# Patient Record
Sex: Male | Born: 1960 | Race: White | Hispanic: No | Marital: Single | Smoking: Current every day smoker
Health system: Southern US, Community
[De-identification: ages and names within clinical notes are randomized; demographics above are authoritative.]

## PROBLEM LIST (undated history)

## (undated) ENCOUNTER — Encounter

## (undated) DIAGNOSIS — G709 Myoneural disorder, unspecified: Secondary | ICD-10-CM

## (undated) DIAGNOSIS — M62838 Other muscle spasm: Secondary | ICD-10-CM

## (undated) DIAGNOSIS — R0600 Dyspnea, unspecified: Secondary | ICD-10-CM

## (undated) DIAGNOSIS — F4310 Post-traumatic stress disorder, unspecified: Secondary | ICD-10-CM

## (undated) DIAGNOSIS — R32 Unspecified urinary incontinence: Secondary | ICD-10-CM

## (undated) DIAGNOSIS — F329 Major depressive disorder, single episode, unspecified: Secondary | ICD-10-CM

## (undated) DIAGNOSIS — I10 Essential (primary) hypertension: Secondary | ICD-10-CM

## (undated) DIAGNOSIS — F32A Depression, unspecified: Secondary | ICD-10-CM

## (undated) DIAGNOSIS — E785 Hyperlipidemia, unspecified: Secondary | ICD-10-CM

## (undated) DIAGNOSIS — R159 Full incontinence of feces: Secondary | ICD-10-CM

## (undated) HISTORY — DX: Essential (primary) hypertension: I10

## (undated) HISTORY — DX: Hyperlipidemia, unspecified: E78.5

## (undated) HISTORY — PX: HAND SURGERY: SHX662

## (undated) HISTORY — DX: Major depressive disorder, single episode, unspecified: F32.9

## (undated) HISTORY — DX: Depression, unspecified: F32.A

## (undated) HISTORY — DX: Myoneural disorder, unspecified: G70.9

## (undated) HISTORY — PX: OTHER SURGICAL HISTORY: SHX169

---

## 2015-06-20 HISTORY — PX: SHOULDER SURGERY: SHX246

## 2016-01-07 DIAGNOSIS — S0093XA Contusion of unspecified part of head, initial encounter: Secondary | ICD-10-CM

## 2016-01-07 DIAGNOSIS — M25512 Pain in left shoulder: Principal | ICD-10-CM

## 2016-01-07 DIAGNOSIS — S0083XA Contusion of other part of head, initial encounter: Secondary | ICD-10-CM

## 2016-01-07 DIAGNOSIS — S43102A Unspecified dislocation of left acromioclavicular joint, initial encounter: Secondary | ICD-10-CM

## 2016-01-07 DIAGNOSIS — W19XXXA Unspecified fall, initial encounter: Secondary | ICD-10-CM

## 2016-01-07 DIAGNOSIS — F17210 Nicotine dependence, cigarettes, uncomplicated: Secondary | ICD-10-CM

## 2016-01-08 ENCOUNTER — Emergency Department: Admit: 2016-01-08 | Discharge: 2016-01-08

## 2016-01-08 ENCOUNTER — Inpatient Hospital Stay: Admit: 2016-01-08 | Discharge: 2016-01-08

## 2016-01-08 DIAGNOSIS — M25512 Pain in left shoulder: Principal | ICD-10-CM

## 2016-01-08 DIAGNOSIS — S0083XA Contusion of other part of head, initial encounter: Secondary | ICD-10-CM

## 2016-01-08 DIAGNOSIS — F17210 Nicotine dependence, cigarettes, uncomplicated: Secondary | ICD-10-CM

## 2016-01-08 DIAGNOSIS — S43102A Unspecified dislocation of left acromioclavicular joint, initial encounter: Secondary | ICD-10-CM

## 2016-01-08 MED ORDER — IBUPROFEN 400 MG PO TABS
800 mg | Freq: Once | ORAL | Status: CP
Start: 2016-01-08 — End: ?

## 2016-01-08 MED ORDER — TETANUS-DIPHTH-ACELL PERTUSSIS 5-2-15.5 LF-MCG/0.5 IM SUSP
.5 mL | Freq: Once | INTRAMUSCULAR | Status: CP
Start: 2016-01-08 — End: ?

## 2016-01-08 MED ORDER — OXYCODONE-ACETAMINOPHEN 5-325 MG PO TABS
1 | ORAL | 0 refills | Status: CP | PRN
Start: 2016-01-08 — End: ?

## 2016-01-08 MED ORDER — OXYCODONE-ACETAMINOPHEN 5-325 MG PO TABS
5 mg | Freq: Once | ORAL | Status: CP
Start: 2016-01-08 — End: ?
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
## 2016-01-08 NOTE — ED Notes
55 yo wm ambulates to room c co left shoulder pain after falling on concrete.  Said there was a "lump" on his shoulder that disappeared when he moved it.  Unable to move left arm comfortably now.  Abrasions to left temple, right wrist, right ankle, and arch of left foot.  Last tetanus greater than 5 years ago.

## 2016-01-08 NOTE — ED Provider Notes
Cardiovascular: Negative for chest pain and leg swelling.   Gastrointestinal: Negative for nausea, vomiting, abdominal pain and diarrhea.   Genitourinary: Negative for dysuria and difficulty urinating.   Musculoskeletal: Positive for arthralgias. Negative for back pain.   Skin: Positive for wound (abrasions). Negative for rash.   Neurological: Negative for dizziness and headaches.   All other systems reviewed and are negative.      Physical Exam     ED Triage Vitals   BP 01/07/16 2302 114/75   Pulse 01/07/16 2302 78   Resp 01/07/16 2302 18   Temp 01/07/16 2302 36.7 ?C (98 ?F)   Temp src 01/07/16 2302 Oral   Height 01/07/16 2302 1.88 m   Weight 01/07/16 2302 99.8 kg   SpO2 01/07/16 2302 96 %   BMI (Calculated) 01/07/16 2302 28.31       BP 114/75 - Pulse 78 - Temp 36.7 ?C (98 ?F) (Oral)  - Resp 18 - Ht 1.88 m - Wt 99.8 kg - SpO2 96% - BMI 28.25 kg/m2    Physical Exam   Constitutional: He is oriented to person, place, and time. He appears well-developed and well-nourished. No distress.   HENT:   Head: Normocephalic.   Nose: Nose normal.   Contusion to the left forehead   Eyes: Conjunctivae and EOM are normal. Pupils are equal, round, and reactive to light.   Neck: Neck supple. No tracheal deviation present.   No C spine tenderness   Cardiovascular: Normal rate, regular rhythm and normal heart sounds.    Pulmonary/Chest: Effort normal and breath sounds normal. No stridor. No respiratory distress. He has no wheezes. He has no rales. He exhibits no tenderness.   Abdominal: Soft. He exhibits no distension. There is no tenderness. There is no rebound.   Musculoskeletal: He exhibits tenderness. He exhibits no edema or deformity.   TTP over the left distal clavicle. FROM of the left shouolder. Good distal pulses. Distal sensation and motor intact.   No T or L spine tenderness.   Abrasions to the right forearm with no bony tenderness throughout the right upper extremity.

## 2016-01-08 NOTE — ED Provider Notes
Musculoskeletal: He exhibits tenderness. He exhibits no edema or deformity.   TTP over the left distal clavicle. FROM of the left shouolder. Good distal pulses. Distal sensation and motor intact.   No T or L spine tenderness.   Abrasions to the right forearm with no bony tenderness throughout the right upper extremity.      Neurological: He is alert and oriented to person, place, and time.   Skin: Skin is warm and dry. He is not diaphoretic.   Psychiatric: He has a normal mood and affect.   Nursing note and vitals reviewed.      Differential DDx: head contusion, head bleed, clavicle fracture, shoulder sprain, other    Is this an Emergent Medical Condition? Yes - Severe Pain/Acute Onset of Symptons  409.901 FS  641.19 FS  627.732 (16) FS    ED Workup   Procedures    Labs:  - - No data to display      Imaging (Read by ED Provider):  Per Radiology: Ct Head W/o Con    Result Date: 01/08/2016  CT HEAD WITHOUT CONTRAST HISTORY: Mechanical fall TECHNIQUE: Noncontrast CT of the brain was performed. COMPARISON: None FINDINGS:There is no acute intracranial hemorrhage.  There is no midline shift or mass effect.  There are no extra-axial collections.  The ventricles, sulci, and cisterns are unremarkable.  There is no CT evidence for acute territorial infarct. The paranasal sinuses and mastoid air cells are clear. Small left frontal scalp hematoma. Otherwise the soft tissues, bones and globes are unremarkable.     No evidence of acute intracranial abnormality. Very small left frontal scalp hematoma.     Per Radiology: Xr Foot Left 3 Views    Result Date: 01/08/2016  XR FOOT LEFT 3 VIEWS Clinical indication:54 years Male Fall Comparison:  None Findings: Negative for fracture or dislocation.  The joint spaces are normal.  Soft tissues are unremarkable.      Impression: Negative for fracture or acute osseous abnormality.     Per Radiology: Xr Shoulder Left Complete 2 Or 3 Views    Result Date: 01/08/2016
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
## 2016-01-08 NOTE — ED Provider Notes
History   No chief complaint on file.      HPI    Allergies not on file    Patient's Medications    No medications on file       No past medical history on file.    No past surgical history on file.    No family history on file.    Social History     Social History   ? Marital status: N/A     Spouse name: N/A   ? Number of children: N/A   ? Years of education: N/A     Social History Main Topics   ? Smoking status: Not on file   ? Smokeless tobacco: Not on file   ? Alcohol use Not on file   ? Drug use: Not on file   ? Sexual activity: Not on file     Other Topics Concern   ? Not on file     Social History Narrative       Review of Systems    Physical Exam     ED Triage Vitals   BP 01/07/16 2302 114/75   Pulse 01/07/16 2302 78   Resp 01/07/16 2302 18   Temp 01/07/16 2302 36.7 ?C (98 ?F)   Temp src 01/07/16 2302 Oral   Height 01/07/16 2302 1.88 m   Weight 01/07/16 2302 99.8 kg   SpO2 01/07/16 2302 96 %   BMI (Calculated) 01/07/16 2302 28.31       BP 114/75 - Pulse 78 - Temp 36.7 ?C (98 ?F) (Oral)  - Resp 18 - Ht 1.88 m - Wt 99.8 kg - SpO2 96% - BMI 28.25 kg/m2    Physical Exam    Differential DDx: ***    Is this an Emergent Medical Condition? {SH ED EMERGENT MEDICAL CONDITION:[phone]}  409.901 FS  641.19 FS  627.732 (16) FS    ED Workup   Procedures    Labs:  - - No data to display      Imaging (Read by ED Provider):  {Imaging findings:[phone]}    EKG (Read by ED Provider):  {EKG [phone]}    MDM    ED Course & Re-Evaluation     ED Course         ED Disposition   ED Disposition: No ED Disposition Set      ED Clinical Impression   ED Clinical Impression:   No Clinical Impression Set      ED Patient Status   Patient Status:   {SH ED [facility] PATIENT [phone]}        ED Medical Evaluation Initiated   Medical Evaluation Initiated:   Yes, filed at 01/07/16 2359  by Werner Lean, MD

## 2016-01-08 NOTE — ED Provider Notes
Impression: AC joint widening with slight elevation of the distal clavicle suspicious for grade 2 AC joint injury.     EKG (Read by ED Provider):  not applicable    MDM  Number of Diagnoses or Management Options  Acromioclavicular joint separation, left, initial encounter:   Contusion of head, unspecified part of head, initial encounter:   Fall, initial encounter:      Amount and/or Complexity of Data Reviewed  Tests in the radiology section of CPT?: ordered and reviewed  Discussion of test results with the performing providers: no  Decide to obtain previous medical records or to obtain history from someone other than the patient: no  Obtain history from someone other than the patient: no  Review and summarize past medical records: no  Discuss the patient with other providers: no  Independent visualization of images, tracings, or specimens: yes        ED Course & Re-Evaluation     Patient is a 55 y.o. male presenting to the ED today with c/o left shouder pain status post ground level fall while running. Positive LOC. Patient also with note of some abrasions. No HA, CP, abdominal pain, or N/V. Will provide patient with Motrin and Percocet; imaging studies ordered. Will re-assess.  Werner Lean, MD 12:16 AM 01/08/2016    Patient is resting comfortably and feels improved after Percocet. CT Head negative. XR L Shoulder shows widening of the AC joint. Results discussed with the patient at length. Sling applied in the ED. Will discharge home with prescription for Percocet. PCP and ortho follow up given.   Werner Lean, MD 2:20 AM 01/08/2016    ED Course     ED Disposition   ED Disposition: No ED Disposition Set      ED Clinical Impression   ED Clinical Impression:   No Clinical Impression Set      ED Patient Status   Patient Status:   Good        ED Medical Evaluation Initiated   Medical Evaluation Initiated:   Yes, filed at 01/07/16 2359  by Werner Lean, MD

## 2016-01-08 NOTE — ED Provider Notes
Neurological: He is alert and oriented to person, place, and time.   Skin: Skin is warm and dry. He is not diaphoretic.   Psychiatric: He has a normal mood and affect.   Nursing note and vitals reviewed.      Differential DDx: head contusion, head bleed, clavicle fracture, shoulder sprain, other    Is this an Emergent Medical Condition? Yes - Severe Pain/Acute Onset of Symptons  409.901 FS  641.19 FS  627.732 (16) FS    ED Workup   Procedures    Labs:  - - No data to display      Imaging (Read by ED Provider):  Per Radiology: Ct Head W/o Con    Result Date: 01/08/2016  CT HEAD WITHOUT CONTRAST HISTORY: Mechanical fall TECHNIQUE: Noncontrast CT of the brain was performed. COMPARISON: None FINDINGS:There is no acute intracranial hemorrhage.  There is no midline shift or mass effect.  There are no extra-axial collections.  The ventricles, sulci, and cisterns are unremarkable.  There is no CT evidence for acute territorial infarct. The paranasal sinuses and mastoid air cells are clear. Small left frontal scalp hematoma. Otherwise the soft tissues, bones and globes are unremarkable.     No evidence of acute intracranial abnormality. Very small left frontal scalp hematoma.     Per Radiology: Xr Foot Left 3 Views    Result Date: 01/08/2016  XR FOOT LEFT 3 VIEWS Clinical indication:54 years Male Fall Comparison:  None Findings: Negative for fracture or dislocation.  The joint spaces are normal.  Soft tissues are unremarkable.      Impression: Negative for fracture or acute osseous abnormality.     Per Radiology: Xr Shoulder Left Complete 2 Or 3 Views    Result Date: 01/08/2016  XR SHOULDER LEFT COMPLETE 3 VIEWS Clinical indication:54 years Male Fall Comparison:  None Findings: No evidence of fracture. There is widened appearance of the acromioclavicular joint with slight  elevation of the distal clavicle, suspicious for grade 2 AC joint injury.  The glenohumeral joint space is unremarkable.  Soft tissues are unremarkable.

## 2016-01-08 NOTE — ED Provider Notes
?   Sexual activity: Not Asked     Other Topics Concern   ? None     Social History Narrative   ? None       Review of Systems   Constitutional: Negative for fever and chills.   HENT: Negative for nasal congestion, sore throat and neck pain.    Eyes: Negative for discharge and redness.   Respiratory: Negative for cough, shortness of breath and wheezing.    Cardiovascular: Negative for chest pain and leg swelling.   Gastrointestinal: Negative for nausea, vomiting, abdominal pain and diarrhea.   Genitourinary: Negative for dysuria and difficulty urinating.   Musculoskeletal: Positive for arthralgias. Negative for back pain.   Skin: Positive for wound (abrasions). Negative for rash.   Neurological: Negative for dizziness and headaches.   All other systems reviewed and are negative.      Physical Exam       ED Triage Vitals   BP 01/07/16 2302 114/75   Pulse 01/07/16 2302 78   Resp 01/07/16 2302 18   Temp 01/07/16 2302 36.7 ?C (98 ?F)   Temp src 01/07/16 2302 Oral   Height 01/07/16 2302 1.88 m   Weight 01/07/16 2302 99.8 kg   SpO2 01/07/16 2302 96 %   BMI (Calculated) 01/07/16 2302 28.31       BP 127/83 - Pulse 81 - Temp 36.4 ?C (97.6 ?F) (Oral)  - Resp 18 - Ht 1.88 m - Wt 95.3 kg - SpO2 98% - BMI 26.98 kg/m2    Physical Exam   Constitutional: He is oriented to person, place, and time. He appears well-developed and well-nourished. No distress.   HENT:   Head: Normocephalic.   Nose: Nose normal.   Contusion to the left forehead   Eyes: Conjunctivae and EOM are normal. Pupils are equal, round, and reactive to light.   Neck: Neck supple. No tracheal deviation present.   No C spine tenderness   Cardiovascular: Normal rate, regular rhythm and normal heart sounds.    Pulmonary/Chest: Effort normal and breath sounds normal. No stridor. No respiratory distress. He has no wheezes. He has no rales. He exhibits no tenderness.   Abdominal: Soft. He exhibits no distension. There is no tenderness. There is no rebound.

## 2016-01-08 NOTE — ED Provider Notes
Scribe Attestation:  IPrudencio Burly, have acted as Neurosurgeon for Werner Lean, MD 12:16 AM 01/08/2016    Physician Attestation: Marland Kitchen

## 2016-01-08 NOTE — ED Provider Notes
Scribe Attestation:  I, Prudencio Burly, have acted as Neurosurgeon for Werner Lean, MD 12:16 AM 01/08/2016    Physician Attestation: I have reviewed and confirmed the information stated by the scribe and made corrections and edits as appropriate.  I have personally provided the services documented by the scribe.      Mendel Ryder, MD  Mendel Ryder, MD 4:07 AM 01/08/2016

## 2016-01-08 NOTE — ED Provider Notes
Neurological: He is alert and oriented to person, place, and time.   Skin: Skin is warm and dry. He is not diaphoretic.   Psychiatric: He has a normal mood and affect.   Nursing note and vitals reviewed.      Differential DDx: head contusion, head bleed, clavicle fracture, shoulder sprain, other    Is this an Emergent Medical Condition? Yes - Severe Pain/Acute Onset of Symptons  409.901 FS  641.19 FS  627.732 (16) FS    ED Workup   Procedures    Labs:  - - No data to display      Imaging (Read by ED Provider):  {Imaging findings:[phone]}    EKG (Read by ED Provider):  {EKG findings:[phone]}    MDM    ED Course & Re-Evaluation     Patient is a 55 y.o. male presenting to the ED today with c/o left shouder pain status post ground level fall while running. Positive LOC. Patient also with note of some abrasions. No HA, CP, abdominal pain, or N/V. Will provide patient with Motrin and Percocet; imaging studies ordered. Will re-assess.  Werner Lean, MD 12:16 AM 01/08/2016    ED Course     ED Disposition   ED Disposition: No ED Disposition Set      ED Clinical Impression   ED Clinical Impression:   No Clinical Impression Set      ED Patient Status   Patient Status:   {SH ED [facility] PATIENT [phone]}        ED Medical Evaluation Initiated   Medical Evaluation Initiated:   Yes, filed at 01/07/16 2359  by Werner Lean, MD              Scribe Attestation:  I, Prudencio Burly, have acted as scribe for Werner Lean, MD 12:16 AM 01/08/2016    Physician Attestation: Marland Kitchen

## 2016-01-08 NOTE — ED Provider Notes
XR SHOULDER LEFT COMPLETE 3 VIEWS Clinical indication:54 years Male Fall Comparison:  None Findings: No evidence of fracture. There is widened appearance of the acromioclavicular joint with slight  elevation of the distal clavicle, suspicious for grade 2 AC joint injury.  The glenohumeral joint space is unremarkable.  Soft tissues are unremarkable.      Impression: AC joint widening with slight elevation of the distal clavicle suspicious for grade 2 AC joint injury.     EKG (Read by ED Provider):  not applicable    MDM  Number of Diagnoses or Management Options  Acromioclavicular joint separation, left, initial encounter:   Contusion of head, unspecified part of head, initial encounter:   Fall, initial encounter:      Amount and/or Complexity of Data Reviewed  Tests in the radiology section of CPT?: ordered and reviewed  Discussion of test results with the performing providers: no  Decide to obtain previous medical records or to obtain history from someone other than the patient: no  Obtain history from someone other than the patient: no  Review and summarize past medical records: no  Discuss the patient with other providers: no  Independent visualization of images, tracings, or specimens: yes        ED Course & Re-Evaluation     Patient is a 55 y.o. male presenting to the ED today with c/o left shouder pain status post ground level fall while running. Positive LOC. Patient also with note of some abrasions. No HA, CP, abdominal pain, or N/V. Will provide patient with Motrin and Percocet; imaging studies ordered. Will re-assess.  Werner Lean, MD 12:16 AM 01/08/2016    Patient is resting comfortably and feels improved after Percocet. CT Head negative. XR L Shoulder shows widening of the AC joint. Results discussed with the patient at length. Sling applied in the ED. Will discharge home with prescription for Percocet. PCP and ortho follow up given. I will

## 2016-01-08 NOTE — ED Provider Notes
treat pt for fall, head contusion, and shoulder sprain.  Werner Lean, MD 2:20 AM 01/08/2016    ED Course     ED Disposition   ED Disposition: Discharge      ED Clinical Impression   ED Clinical Impression:   Fall, initial encounter  Acromioclavicular joint separation, left, initial encounter  Contusion of head, unspecified part of head, initial encounter      ED Patient Status   Patient Status:   Good        ED Medical Evaluation Initiated   Medical Evaluation Initiated:   Yes, filed at 01/07/16 2359  by Werner Lean, MD              Scribe Attestation:  I, Prudencio Burly, have acted as scribe for Werner Lean, MD 12:16 AM 01/08/2016    Physician Attestation: I have reviewed and confirmed the information stated by the scribe and made corrections and edits as appropriate.  I have personally provided the services documented by the scribe.      Mendel Ryder, MD  Mendel Ryder, MD 4:07 AM 01/08/2016         Werner Lean, MD  01/08/16 [phone]

## 2016-01-08 NOTE — ED Notes
Discharged to home c instructions.  Sling provided and placed to left arm.  Circulation checks good.  Ambulated out of ED c steady gait.

## 2016-01-21 DIAGNOSIS — M659 Synovitis and tenosynovitis, unspecified: Secondary | ICD-10-CM

## 2016-01-21 DIAGNOSIS — F17200 Nicotine dependence, unspecified, uncomplicated: Secondary | ICD-10-CM

## 2016-01-21 DIAGNOSIS — S46019A Strain of muscle(s) and tendon(s) of the rotator cuff of unspecified shoulder, initial encounter: Secondary | ICD-10-CM

## 2016-01-21 DIAGNOSIS — S43005A Unspecified dislocation of left shoulder joint, initial encounter: Principal | ICD-10-CM

## 2016-01-21 DIAGNOSIS — S43439A Superior glenoid labrum lesion of unspecified shoulder, initial encounter: Secondary | ICD-10-CM

## 2016-02-01 ENCOUNTER — Ambulatory Visit: Primary: Specialist

## 2016-02-01 DIAGNOSIS — F17200 Nicotine dependence, unspecified, uncomplicated: Secondary | ICD-10-CM

## 2016-02-01 DIAGNOSIS — M659 Synovitis and tenosynovitis, unspecified: Secondary | ICD-10-CM

## 2016-02-01 DIAGNOSIS — E785 Hyperlipidemia, unspecified: Secondary | ICD-10-CM

## 2016-02-01 DIAGNOSIS — S43439A Superior glenoid labrum lesion of unspecified shoulder, initial encounter: Secondary | ICD-10-CM

## 2016-02-01 DIAGNOSIS — S43005A Unspecified dislocation of left shoulder joint, initial encounter: Principal | ICD-10-CM

## 2016-02-01 DIAGNOSIS — S46019A Strain of muscle(s) and tendon(s) of the rotator cuff of unspecified shoulder, initial encounter: Secondary | ICD-10-CM

## 2016-02-01 DIAGNOSIS — I10 Essential (primary) hypertension: Secondary | ICD-10-CM

## 2016-02-01 MED ORDER — TADALAFIL 10 MG PO TABS
ORAL | PRN
Start: 2016-02-01 — End: ?

## 2016-02-01 MED ORDER — UNKNOWN TO PATIENT
Status: SS
Start: 2016-02-01 — End: 2016-02-11

## 2016-02-01 MED ORDER — HYDROCHLOROTHIAZIDE 25 MG PO TABS
25 mg | Freq: Every day | ORAL
Start: 2016-02-01 — End: ?

## 2016-02-01 MED ORDER — ATORVASTATIN CALCIUM 10 MG PO TABS
10 mg | Freq: Every day | ORAL
Start: 2016-02-01 — End: ?

## 2016-02-11 ENCOUNTER — Ambulatory Visit

## 2016-02-11 ENCOUNTER — Ambulatory Visit: Admit: 2016-02-11 | Discharge: 2016-02-11 | Primary: Specialist

## 2016-02-11 ENCOUNTER — Inpatient Hospital Stay

## 2016-02-11 DIAGNOSIS — S43005A Unspecified dislocation of left shoulder joint, initial encounter: Principal | ICD-10-CM

## 2016-02-11 DIAGNOSIS — S46019A Strain of muscle(s) and tendon(s) of the rotator cuff of unspecified shoulder, initial encounter: Secondary | ICD-10-CM

## 2016-02-11 DIAGNOSIS — S43439A Superior glenoid labrum lesion of unspecified shoulder, initial encounter: Secondary | ICD-10-CM

## 2016-02-11 DIAGNOSIS — I10 Essential (primary) hypertension: Principal | ICD-10-CM

## 2016-02-11 DIAGNOSIS — E785 Hyperlipidemia, unspecified: Secondary | ICD-10-CM

## 2016-02-11 DIAGNOSIS — M659 Synovitis and tenosynovitis, unspecified: Secondary | ICD-10-CM

## 2016-02-11 DIAGNOSIS — F17200 Nicotine dependence, unspecified, uncomplicated: Secondary | ICD-10-CM

## 2016-02-11 MED ORDER — PHENYLEPHRINE HCL-NACL (PF) 1-0.9 MG/10ML-% IV SOSY
Status: DC | PRN
Start: 2016-02-11 — End: 2016-02-11

## 2016-02-11 MED ORDER — DEXAMETHASONE SODIUM PHOSPHATE 4 MG/ML CUSTOM COMPONENT JX
Status: DC | PRN
Start: 2016-02-11 — End: 2016-02-11

## 2016-02-11 MED ORDER — DIPHENHYDRAMINE HCL 50 MG/ML IJ SOLN
12.5 mg | Freq: Once | INTRAVENOUS | Status: DC | PRN
Start: 2016-02-11 — End: 2016-02-12

## 2016-02-11 MED ORDER — PROPOFOL 10 MG/ML IV EMUL CUSTOM SH
Status: DC | PRN
Start: 2016-02-11 — End: 2016-02-11

## 2016-02-11 MED ORDER — PROMETHAZINE HCL 25 MG/ML IJ SOLN
25 mg | Freq: Four times a day (QID) | INTRAMUSCULAR | Status: DC | PRN
Start: 2016-02-11 — End: 2016-02-12

## 2016-02-11 MED ORDER — LIDOCAINE HCL 1 % IJ SOLN
Status: DC | PRN
Start: 2016-02-11 — End: 2016-02-11

## 2016-02-11 MED ORDER — CEFAZOLIN SODIUM 1 G IJ SOLR
2 g | Freq: Once | INTRAVENOUS | Status: CP
Start: 2016-02-11 — End: ?

## 2016-02-11 MED ORDER — HYDROMORPHONE HCL 1 MG/ML IJ SOLN
.5 mg | INTRAVENOUS | Status: DC | PRN
Start: 2016-02-11 — End: 2016-02-12

## 2016-02-11 MED ORDER — OXYCODONE-ACETAMINOPHEN 5-325 MG PO TABS
5 mg | ORAL | Status: DC | PRN
Start: 2016-02-11 — End: 2016-02-12

## 2016-02-11 MED ORDER — LACTATED RINGERS IV SOLN
Freq: Once | INTRAVENOUS | Status: CP
Start: 2016-02-11 — End: ?

## 2016-02-11 MED ORDER — MIDAZOLAM HCL 2 MG/2ML IJ SOLN
Status: DC | PRN
Start: 2016-02-11 — End: 2016-02-11

## 2016-02-11 MED ORDER — ROPIVACAINE HCL 5 MG/ML IJ SOLN
Status: DC | PRN
Start: 2016-02-11 — End: 2016-02-11

## 2016-02-11 MED ORDER — EPINEPHRINE HCL 1 MG/ML IJ SOLN CUSTOM SH
Status: DC | PRN
Start: 2016-02-11 — End: 2016-02-11

## 2016-02-11 MED ORDER — LACTATED RINGERS IV SOLN
Status: DC | PRN
Start: 2016-02-11 — End: 2016-02-11

## 2016-02-11 MED ORDER — PROMETHAZINE HCL 25 MG/ML IJ SOLN
6.25 mg | INTRAVENOUS | Status: DC | PRN
Start: 2016-02-11 — End: 2016-02-12

## 2016-02-11 MED ORDER — CELECOXIB 200 MG PO CAPS
400 mg | Freq: Once | ORAL | Status: CP
Start: 2016-02-11 — End: ?

## 2016-02-11 MED ORDER — GABAPENTIN 300 MG PO CAPS
600 mg | Freq: Once | ORAL | Status: CP
Start: 2016-02-11 — End: ?

## 2016-02-11 MED ORDER — NALOXONE HCL 2 MG/2ML IJ SOSY
.4 mg | INTRAVENOUS | Status: DC | PRN
Start: 2016-02-11 — End: 2016-02-12

## 2016-02-11 MED ORDER — FENTANYL CITRATE INJ 50 MCG/ML CUSTOM AMP/VIAL
Status: DC | PRN
Start: 2016-02-11 — End: 2016-02-11

## 2016-02-11 MED ORDER — DEXMEDETOMIDINE HCL 200 MCG/2ML IV SOLN
Status: DC | PRN
Start: 2016-02-11 — End: 2016-02-11

## 2016-02-11 MED ORDER — EPHEDRINE SULFATE-NACL 25-0.9 MG/5ML-% IV SOSY
Status: DC | PRN
Start: 2016-02-11 — End: 2016-02-11

## 2016-02-11 MED ORDER — OXYCODONE HCL 5 MG PO TABS
10 mg | Freq: Once | ORAL | Status: DC | PRN
Start: 2016-02-11 — End: 2016-02-12

## 2016-02-11 MED ORDER — ONDANSETRON HCL 4 MG/2ML IJ SOLN
Status: DC | PRN
Start: 2016-02-11 — End: 2016-02-11

## 2016-02-11 MED ORDER — ACETAMINOPHEN 500 MG PO TABS
1000 mg | Freq: Once | ORAL | Status: CP
Start: 2016-02-11 — End: ?

## 2016-02-11 MED ORDER — ONDANSETRON HCL 4 MG/2ML IJ SOLN
4 mg | Freq: Once | INTRAVENOUS | Status: DC | PRN
Start: 2016-02-11 — End: 2016-02-12

## 2016-02-11 NOTE — Progress Notes
Patient and friend verbalize understanding of discharge instructions. Discharged home via w/c to care of friend.

## 2016-02-11 NOTE — Immediate Post-Operative Note
[facility]  Immediate Post-Operative Note      Case Record #: [id number]  James Flynn  10/03/1960, 55 y.o., male    Case Date: 02/11/2016    Staff:  Surgeon(s) and Role:     * Swanson, Christopher E, MD - Primary  Anesthesiologist: Warrick, Matthew D, MD  CRNA: Ratermann, Andrea, CRNA      Pre-Op Diagnosis:   Dislocation of left acromioclavicular joint [S43.102A]    Procedure(s):  29827 - ARTHROSCOPIC ROTATOR CUFF REPAIR (Left)  29828 - ARTHROSCOPY SHOULDER W/TENODESIS BICEPS (Left)  29826 - ARTHROSCOPY SHOULDER W/SUBACROMIAL DECOMPRESSION W/PART ACROMIOPLASTY (Left)  29823 - ARTHROSCOPY SHOULDER W/EXTENSIVE DEB (Left)  29807 - ARTHROSCOPY SHOULDER REPAIR SLAP LESION (Left)    Post-Op Diagnosis Codes:     * Dislocation of left acromioclavicular joint [S43.102A]      Teaching MD Documentation  1. A Resident/ACGME fellow participated in this case: No   2. A qualified Resident/ACGME fellow was available to participate in the case:no        Findings/Description: AC Joint dislocation surgically reduced with reconstruciton.     Specimens: None    SCD's applied: yes    EBL: 4 mls

## 2016-02-11 NOTE — Anesthesia Preprocedure Evaluation
Department of Anesthesiology  Pre-operative Evaluation Record    Patient Name: James Flynn                  Sex: male                  Age: 55 y.o.                  MRN: [id number]     Allergies:   Review of patient's allergies indicates no known allergies.    Current Med List   Medication Sig   ? UNKNOWN TO PATIENT Blood pressure med       Prior Surgeries:     Past Surgical History:   Procedure Laterality Date   ? HAND SURGERY Right        Social   ETOH:       Alcohol Use   ? 36.0 oz/week   ? 3 Cans of beer per week     Comment: daily     Tobacco:   History   Smoking Status   ? Current Every Day Smoker   ? Packs/day: 1.00   ? Types: Cigarettes   Smokeless Tobacco   ? Never Used     Drugs:   History   Drug Use No       Evaluation:   ROS / Med Hx:  Negative for review of systems and medical history except otherwise noted below:    Cardiovascular: Negative cardio ROS except otherwise noted. Cardiac history includes: hyperlipidemia and hypertension. The hypertension is well controlled. The hypertension has occurred for 6 months - 1 year.   GI/Hepatic: Negative for GI/hepatic ROS except otherwise noted. acid reflux. food triggered GERD.   Endo/Other: Negative for endo/other ROS except otherwise noted

## 2016-02-11 NOTE — Anesthesia Procedure Notes
Peripheral Block  Location: Main PreOp  Start Time: 02/11/2016 11:13 AM  Block Reason: procedure for pain, at surgeon's request and post-op pain management  Patient Sedation: light sedation    Staff Involved in Procedure  Attending: Barnie AldermanWARRICK, MATTHEW D  Performed By: attending  Preanesthesia Checklist: patient identified, IV functional, site marked, risks and benefits discussed, surgical consent signed, monitors and equipment checked, pre-op evaluation completed, timeout performed, anesthesia consent signed, allergies checked, contraindications to procedure verified and consented to blood products    The patient was positioned right lateral decubitus. Cardiac monitors, continuous pulse ox and NIBP were applied. The skin was draped and prepped using chloraprep.A  left single-shot interscalene block was performed using a 2 inch 22 G Stimuplex A needle while employing a(n) anatomical landmarks and ultrasound guidance technique. and a twitch was not titrated. Slowly, 18mL of 0.5% ropivacaine was injected through the needle while carefully monitoring the patient. Upon therapeutic injection there was incremental injection and local visualized surrounding nerve on ultrasound. The patient did not have heme aspirated, hemodynamic instability, painful injection, resistance to injection, neurological changes or paresthesia on injection. After completing the procedure, the patient was monitored and will be evaluated post-op. Procedure tolerated well by the patient. Vital Signs Stable. Patient Comfortable.

## 2016-02-11 NOTE — Anesthesia Preprocedure Evaluation
Department of Anesthesiology  Pre-operative Evaluation Record    Patient Name: James SchwabWilliam T Cosner                  Sex: male                  Age: 55 y.o.                  MRN: [id number]     Allergies:   Review of patient's allergies indicates no known allergies.    Current Med List   Medication Sig   ? UNKNOWN TO PATIENT Blood pressure med       Prior Surgeries:     Past Surgical History:   Procedure Laterality Date   ? HAND SURGERY Right        Social   ETOH:       Alcohol Use   ? 36.0 oz/week   ? 3 Cans of beer per week     Comment: daily     Tobacco:   History   Smoking Status   ? Current Every Day Smoker   ? Packs/day: 1.00   ? Types: Cigarettes   Smokeless Tobacco   ? Never Used     Drugs:   History   Drug Use No       Evaluation:   ROS / Med Hx:  Negative for review of systems and medical history except otherwise noted below:    Cardiovascular: Negative cardio ROS except otherwise noted. Cardiac history includes: hyperlipidemia and hypertension. The hypertension is well controlled. The hypertension has occurred for 6 months - 1 year.   GI/Hepatic: Negative for GI/hepatic ROS except otherwise noted. acid reflux. food triggered GERD.   Endo/Other: Negative for endo/other ROS except otherwise noted     Physical Exam:  Airway: Mallampati score of I.   Pulmonary:  On pulmonary examination breath sounds clear to auscultation.   Cardiovascular: On cardiovascular examination regular rate and rhythm.     Anesthesia Plan:  Patient has an ASA score of 2 and patient is NPO. Plan for anesthesia technique is general.  With an intravenous type of induction. Anesthetic plan and risks has been discussed with the patient.     I have performed a pre-anesthesia examination, evaluation, and prescribed the anesthesia plan.

## 2016-02-11 NOTE — Progress Notes
Patient slowly awakening.Dr Warriclk to see patient. Patient tolerates small sips gingerale

## 2016-02-11 NOTE — Anesthesia Postprocedure Evaluation
Post Anesthesia Eval    Respiratory function appropriate( normal respiratory rate and oxygen saturation, airway patent )  Cardiovascular function appropriate( normal heart rate and blood pressure )  Mental status appropriate  Patient normothermic  Pain well controlled  No uncontrolled nausea and vomiting  Patient appropriately hydrated  No apparent anesthesia complications  Patient tolerated procedure well  Block instructions given

## 2016-02-11 NOTE — Immediate Post-Operative Note
[facility]  [facility]      Case Record #: [id number]  James SchwabWilliam T Flynn  11/25/1960, 55 y.o., male    Case Date: 02/11/2016    Staff:  Surgeon(s) and Role:     Darrel Hoover* Swanson, Christopher E, MD - Primary  Anesthesiologist: Sharyl NimrodWarrick, Matthew D, MD  CRNA: Tad Mooreatermann, Andrea, CRNA      Pre-Op Diagnosis:   Dislocation of left acromioclavicular joint [S43.102A]    Procedure(s):  [id number] - ARTHROSCOPIC ROTATOR CUFF REPAIR (Left)  [id number] - ARTHROSCOPY SHOULDER W/TENODESIS BICEPS (Left)  [id number] - ARTHROSCOPY SHOULDER W/SUBACROMIAL DECOMPRESSION W/PART ACROMIOPLASTY (Left)  [id number] - ARTHROSCOPY SHOULDER W/EXTENSIVE DEB (Left)  [id number] - ARTHROSCOPY SHOULDER REPAIR SLAP LESION (Left)    Post-Op Diagnosis Codes:     * Dislocation of left acromioclavicular joint [S43.102A]      Teaching MD Documentation  1. A Resident/ACGME fellow participated in this case: No   2. A qualified Resident/ACGME fellow was available to participate in the case:no        [facility]cit[facility].     Specimens: None    SCD's applied: yes    EBL: 4 mls

## 2016-02-11 NOTE — Progress Notes
Patient slowly awakening.Dr Rogelia RohrerWarriclk to see patient. Patient tolerates small sips gingerale

## 2016-02-11 NOTE — Progress Notes
Patient sleeping on and off, sipping gingeralw\e. Denies pain or nausea.

## 2016-02-11 NOTE — Progress Notes
Patient arrives post op   asleep, VSS, report from Ron RN and Sara Leendrea CRNA

## 2016-02-11 NOTE — Progress Notes
Patient sleeping on and off, sipping gingeralw\e. Denies pain or nausea.

## 2016-02-11 NOTE — Progress Notes
Patient arrives post op   asleep, VSS, report from Ron RN and Andrea CRNA

## 2016-02-14 ENCOUNTER — Inpatient Hospital Stay: Primary: Specialist

## 2016-02-18 ENCOUNTER — Inpatient Hospital Stay: Admit: 2016-02-18 | Discharge: 2016-03-18 | Primary: Specialist

## 2016-02-18 DIAGNOSIS — M25512 Pain in left shoulder: Principal | ICD-10-CM

## 2016-02-18 NOTE — Initial Plan of Care
Upper Extremity ROM: L shoulder AROM to be assessed at a later date due to stage of healing/MD protocol restrictions    SHOULDER AROM (degrees) PROM (degrees)    Left Right Left Endfeel Right Endfeel   Flexion NT 170 90 Empty NT NA   Abduction/Scaption NT 164 60 Empty NT NA   Internal Rotation NT To T10 To chest wall NA NT NA   External Rotation NT To T5 0 Empty NT NA       Flexibility: Not assessed due to stage of healing/MD protocol restrictions      Upper Extremity Strength: L shoulder MMT to be assessed at a later date due to stage of healing/MD protocol restrictions    SHOULDER Left Right   Flexion NT 5/5   Abduction NT 5/5   Internal Rotation NT 5/5   External Rotation NT 5/5       Upper Extremity Palpation:  ? Shoulder: Palpation reveals tenderness over the generalized L shoulder tenderness with palpation.      Joint Mobility   Left   Right   To be assessed at a later date                      Sensory/Reflex/Peripheral Nerve Integrity: No reported deficits in upper extremity sensibility.    Deep tendon reflexes:  ? None tested      Special Tests:  Shoulder:   ? None    Elbow:  ? None    Wrist/Hand:  ? None      Functional Mobility/Transfers: I with functional mobility/transfers      Outcome Measures:  ? FOTO Functional Status Measure: 45/100 (Full report available in Media Tab)      Treatment:  Treatment today consisted of the therapist and patient/caregiver discussing the treatment plans, goals, benefits, risks/drawbacks, potential problems related to recuperation, likelihood of success, potential results of non-treatment, significant alternative care options and the recommended frequency and duration of treatment.  The patient/caregiver is in agreement with the above and consented to the continuation of therapy.    Treatment today also consisted of: exercises including pendulums, scapular retractions, and gentle shoulder shrugs. PROM to L shoulder FF 90 degrees, scaption to 60 degrees, and ER to 0 degrees.
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
## 2016-02-18 NOTE — Initial Plan of Care
are medically necessary in order to improve the above mentioned deficits so that he may return to full functional use of the L UE for return to full work duties without the need for modificiation as well as full I with BADL/IADL. Patient was educated regarding anatomy, physiology, and HEP.    Prognosis: Good    PLAN:  PT Frequency: 2x/wk     Treatment Duration: 12 weeks     Equipment Recommended: cont use of Ultrasling     Other Services Recommended: NA      GOALS:  Short Term Goals: 6 Weeks  1. Patient will demonstrate I with HEP including AAROM exercises in order to be able to bathe himself with 1/10 or less pain with mod I.    Long Term Goals: 12 Weeks  1. Patient will demonstrate L shoulder PROM to within 5 degrees of R side for FF and ABD and ER to 90 degrees in order to be able to don a shirt independently.  2. Patient will demonstrate L shoulder AROM to within 5 degrees of the R side for FF and ABD, and ER to at least T3 and IR to at least T12 in order to be able to bathe himself independently and without modification.  3. Patient will demonstrate L shoulder strength to at least 4+/5 in order to be able to lift a half gallon of milk into the refrigerator.  4. 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      _________________________  Benay PillowMelinda W Petty, PT  02/18/2016       This plan of care is certified to be medically necessary for the time period from 02/18/2016 to 05/12/2016.

## 2016-02-18 NOTE — Initial Plan of Care
success, potential results of non-treatment, significant alternative care options and the recommended frequency and duration of treatment.  The patient/caregiver is in agreement with the above and consented to the continuation of therapy.    Treatment today also consisted of: exercises including pendulums, scapular retractions, and gentle shoulder shrugs. PROM to L shoulder FF 90 degrees, scaption to 60 degrees, and ER to 0 degrees.    Home Exercise Program: The patient was educated in and given a written copy of a home exercise program including the above exercises.    Frequency of HEP 3 times per day    The patient/caregiver understood and demonstrated comprehension of all education given during today?s treatment.    Total Treatment Time: 45 minutes  Total Timed Code Minutes: 20 minutes    Procedures Charged:    All Charges for This Encounter     Code Description Service Date Service Provider Modifiers Qty    9604540957500129 HB PT EVAL LOW COMPLX 02/18/2016 Benay Pillowetty, Melinda W, PT GP 1    [id number] HB PT MAN THERAPY 1 OR MORE EA 15MIN 02/18/2016 Benay Pillowetty, Melinda W, PT GP 1    [id number] HB PT CARRY CURRENT STATUS 02/18/2016 Benay Pillowetty, Melinda W, PT GP, CK 1    [id number] HB PT CARRY GOAL STATUS 02/18/2016 Benay PillowPetty, Melinda W, PT GP, CI 1            Physical Therapy Plan of Care:     Assessment: Decreased knowledge of therapeutic intervention, Decreased L shoulder range of motion, Decreased L shoulder and periscapular strength, Presence of edema, Increased pain, Decreased scapular stability, Impaired posture, Decreased ADL status and Decreased proprioception     Patient presents with decreased L shoulder ROM, presumably decreased L shoulder strength due to recent surgery and immobilization/non-use since injury, abnormal scapular mechanics due to periscapular weakness/atrophy, presence of edema throughout the L shoulder/proximal UE, and decreased functional activity tolerance and decreased I with BADL/IADL. PT services
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
## 2016-02-18 NOTE — Initial Plan of Care
scapular winging rest due to periscapular atrophy. Arrives with post-op dressing doffed and steri-strips applied over incisions. No redness or drainage present (no signs/symptoms of infection).    Posture: forward, rounded shoulder posture (more prominent L side    Effusion/Edema: moderate L shoulder joint effusion/proximal UE edema           Objective:     Cervical Spine Comments: cervical ROM WNL      Upper Extremity ROM: L shoulder AROM to be assessed at a later date due to stage of healing/MD protocol restrictions    SHOULDER AROM (degrees) PROM (degrees)    Left Right Left Endfeel Right Endfeel   Flexion NT 170 90 Empty NT NA   Abduction/Scaption NT 164 60 Empty NT NA   Internal Rotation NT To T10 To chest wall NA NT NA   External Rotation NT To T5 0 Empty NT NA       Flexibility: Not assessed due to stage of healing/MD protocol restrictions      Upper Extremity Strength: L shoulder MMT to be assessed at a later date due to stage of healing/MD protocol restrictions    SHOULDER Left Right   Flexion NT 5/5   Abduction NT 5/5   Internal Rotation NT 5/5   External Rotation NT 5/5       Upper Extremity Palpation:  ? Shoulder: Palpation reveals tenderness over the generalized L shoulder tenderness with palpation.      Joint Mobility   Left   Right   To be assessed at a later date                      Sensory/Reflex/Peripheral Nerve Integrity: No reported deficits in upper extremity sensibility.    Deep tendon reflexes:  ? None tested      Special Tests:  Shoulder:   ? None    Elbow:  ? None    Wrist/Hand:  ? None      Functional Mobility/Transfers: I with functional mobility/transfers      Outcome Measures:  ? FOTO Functional Status Measure: 45/100 (Full report available in Media Tab)      Treatment:  Treatment today consisted of the therapist and patient/caregiver discussing the treatment plans, goals, benefits, risks/drawbacks, potential problems related to recuperation, likelihood of

## 2016-02-18 NOTE — Initial Plan of Care
?   Pre Treatment Pain Rating: 0/10 at rest  ? Post Treatment Pain Rating: 1/10  ? Pain Location/Orientation: generalized L shoulder/proximal UE    ? Pain Descriptors: aching, sharp with movement    Past Medical History:   Diagnosis Date   ? Hyperlipidemia    ? Hypertension      Past Surgical History:   Procedure Laterality Date   ? HAND SURGERY Right      Current Outpatient Prescriptions on File Prior to Encounter   Medication Sig Dispense Refill   ? atorvastatin (LIPITOR) 10 MG Tablet Take 10 mg by mouth daily.     ? hydroCHLOROthiazide (HYDRODIURIL) 25 MG Tablet Take 25 mg by mouth daily.     ? oxyCODONE-acetaminophen (PERCOCET) 5-325 MG Tablet Take 1 tablet by mouth every 4 hours as needed. 15 tablet 0   ? tadalafil (CIALIS) 10 MG Tablet Take by mouth as needed for erectile dysfunction.       No current facility-administered medications on file prior to encounter.        Living Environment Function   Lives in: house:  one story    Stairs to Enter: 5    Home DME: does not use equipment    Bathroom set up: tubshower   Prior Level Of Function: was independent and pain-free with self-care, transfers, ambulation, household tasks, and/or recreational activities    Lives: alone.    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    Current Level of Function: Patient is unable to use his L UE for any BADL/IADL. He must use his R UE to wash his hair and bathe. He is only able to type with his R hand at this time to perform necessary work duties. He requires increased time to perform upper body and lower body dressing and does so with modification due to inability to raise or use L UE. He has difficulty sleeping at night and awakens on average 3 times/night.     Precautions Observations   NWB L UE; [facility]   General comments: Patient arrives to PT with Ultrasling donned L UE. L

## 2016-02-18 NOTE — Initial Plan of Care
GOALS:  Short Term Goals: 6 Weeks  1. Patient will demonstrate I with HEP including AAROM exercises in order to be able to bathe himself with 1/10 or less pain with mod I.    Long Term Goals: 12 Weeks  1. Patient will demonstrate L shoulder PROM to within 5 degrees of R side for FF and ABD and ER to 90 degrees in order to be able to don a shirt independently.  2. Patient will demonstrate L shoulder AROM to within 5 degrees of the R side for FF and ABD, and ER to at least T3 and IR to at least T12 in order to be able to bathe himself independently and without modification.  3. Patient will demonstrate L shoulder strength to at least 4+/5 in order to be able to lift a half gallon of milk into the refrigerator.  4. 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      _________________________  Benay PillowMelinda W Petty, PT  02/18/2016       This plan of care is certified to be medically necessary for the time period from 02/18/2016 to 05/12/2016.

## 2016-02-18 NOTE — Initial Plan of Care
GOALS:  Short Term Goals: 6 Weeks  1. Patient will demonstrate I with HEP including AAROM exercises in order to be able to bathe himself with 1/10 or less pain with mod I.    Long Term Goals: 12 Weeks  1. Patient will demonstrate L shoulder PROM to within 5 degrees of R side for FF and ABD and ER to 90 degrees in order to be able to don a shirt independently.  2. Patient will demonstrate L shoulder AROM to within 5 degrees of the R side for FF and ABD, and ER to at least T3 and IR to at least T12 in order to be able to bathe himself independently and without modification.  3. Patient will demonstrate L shoulder strength to at least 4+/5 in order to be able to lift a half gallon of milk into the refrigerator.  4. 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      _________________________  James Flynn, PT  02/18/2016       This plan of care is certified to be medically necessary for the time period from 02/18/2016 to 111/24/2017.

## 2016-02-18 NOTE — Initial Plan of Care
Home Exercise Program: The patient was educated in and given a written copy of a home exercise program including the above exercises.    Frequency of HEP 3 times per day    The patient/caregiver understood and demonstrated comprehension of all education given during today?s treatment.    Total Treatment Time: 45 minutes  Total Timed Code Minutes: 20 minutes    Procedures Charged:    All Charges for This Encounter     Code Description Service Date Service Provider Modifiers Qty    [id number] HB PT EVAL LOW COMPLX 02/18/2016 Benay Pillowetty, Melinda W, PT GP 1    [id number] HB PT MAN THERAPY 1 OR MORE EA 15MIN 02/18/2016 Benay Pillowetty, Melinda W, PT GP 1    [id number] HB PT CARRY CURRENT STATUS 02/18/2016 Benay Pillowetty, Melinda W, PT GP, CK 1    [id number] HB PT CARRY GOAL STATUS 02/18/2016 Benay PillowPetty, Melinda W, PT GP, CI 1            Physical Therapy Plan of Care:     Assessment: Decreased knowledge of therapeutic intervention, Decreased L shoulder range of motion, Decreased L shoulder and periscapular strength, Presence of edema, Increased pain, Decreased scapular stability, Impaired posture, Decreased ADL status and Decreased proprioception     Patient presents with decreased L shoulder ROM, presumably decreased L shoulder strength due to recent surgery and immobilization/non-use since injury, abnormal scapular mechanics due to periscapular weakness/atrophy, presence of edema throughout the L shoulder/proximal UE, and decreased functional activity tolerance and decreased I with BADL/IADL. PT services are medically necessary in order to improve the above mentioned deficits so that he may return to full functional use of the L UE for return to full work duties without the need for modificiation as well as full I with BADL/IADL. Patient was educated regarding anatomy, physiology, and HEP.    Prognosis: Good    PLAN:  PT Frequency: 2x/wk     Treatment Duration: 12 weeks     Equipment Recommended: cont use of Ultrasling     Other Services Recommended: NA

## 2016-02-18 NOTE — Initial Plan of Care
Medication Sig Dispense Refill   ? atorvastatin (LIPITOR) 10 MG Tablet Take 10 mg by mouth daily.     ? hydroCHLOROthiazide (HYDRODIURIL) 25 MG Tablet Take 25 mg by mouth daily.     ? oxyCODONE-acetaminophen (PERCOCET) 5-325 MG Tablet Take 1 tablet by mouth every 4 hours as needed. 15 tablet 0   ? tadalafil (CIALIS) 10 MG Tablet Take by mouth as needed for erectile dysfunction.       No current facility-administered medications on file prior to encounter.        Living Environment Function   Lives in: house:  one story    Stairs to Enter: 5    Home DME: does not use equipment    Bathroom set up: tubshower   Prior Level Of Function: was independent and pain-free with self-care, transfers, ambulation, household tasks, and/or recreational activities    Lives: alone.    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    Current Level of Function: Patient is unable to use his L UE for any BADL/IADL. He must use his R UE to wash his hair and bathe. He is only able to type with his R hand at this time to perform necessary work duties. He requires increased time to perform upper body and lower body dressing and does so with modification due to inability to raise or use L UE. He has difficulty sleeping at night and awakens on average 3 times/night.     Precautions Observations   NWB L UE; follow [facility]   General comments: Patient arrives to PT with Ultrasling donned L UE. 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 Arrives with post-op dressing doffed and steri-strips applied over incisions. No redness or drainage present (no signs/symptoms of infection).    Posture: forward, rounded shoulder posture (more prominent L side    Effusion/Edema: moderate L shoulder joint effusion/proximal UE edema           Objective:     Cervical Spine Comments: cervical ROM WNL

## 2016-02-19 NOTE — Op Note
approximated to the clavicle and TightRope was used to tension the button.  Our reduction was confirmed under fluoroscopy.  Suture tape was then tied over the button, followed by FiberTape.  All suture tails were then cut.  The wound was copiously irrigated with normal saline solution.  Position of the Dog Bone on the coracoid was again confirmed under direct visualization.  All fluid and debris was removed from the glenohumeral joint using arthroscopic shaver.  Portals were closed using buried 3-0 Monocryl sutures.  Multilayer closure was performed using 0 Vicryl for the fascia, 2-0 Vicryl for subcutaneous layer, running 3-0 Monocryl for skin for the incision over the distal clavicle.  A sterile dressing was placed with Steri-Strips, sterile gauze and tape.  The patient was placed into an UltraSling, awakened from anesthesia and taken to Recovery Room in stable condition.    Kendra Hentkowski PA-C was essential for all aspects of this procedure including positioning, visualization debridement, reduction of the [facility], passage of suture, placement of the button and closure.    PLAN:  Mr. James Flynn will be nonweightbearing of his left upper extremity.  He will leave his dressing in place until Monday, at which point he may remove it to shower.  He will wear a sling at all times.  He was given Percocet for pain control.  He will follow up as an outpatient to begin range of motion and physical therapy.      Darrel Hooverhristopher E Swanson, MD    CS/NTS  DD:  02/11/2016 14:06:55  DT:  02/12/2016 03:31:37  Job#:  [id number]  Conf#:  [id number]

## 2016-02-19 NOTE — Op Note
prepped and draped in normal sterile fashion.  Fluoroscopy was introduced to identify the distal clavicle AC separation.  Standard posterior portal was made.  Arthroscope was introduced into the glenohumeral joint.  Under needle localization and direct visualization, an anterior portal was made in the rotator interval.  There was significant hypertrophic synovitis and rotator interval debrided using arthroscopic shaver.  An accessory anterior superolateral portal was made.  There was evidence of a type 2 SLAP tear, which was debrided using arthroscopic shaver.  The biceps tendon was intact.  There was fraying of the superior border of the subscapularis, debrided using arthroscopic shaver.  There was also partial undersurface fraying of the supraspinatus, which was debrided using arthroscopic shaver.  The remainder of the rotator cuff was intact.  At this point, utilizing our 2 anterior portals, we continued our synovectomy medially and identified in the coracoid process and then subsequently the base of the coracoid.  At this time, an incision was made in Langer's line over the distal clavicle.  Dissection was carried down through skin and subcutaneous tissue.  The muscle was split.  A retractor was placed.  A CC ligament reconstruction guide was then positioned at the base of the coracoid and subsequently on the superior border of the distal clavicle.  A drill hole was then performed, exiting at the base of the coracoid.  We then used a nitinol wire to shuttle a FiberLink.  This was then utilized to pass our sutures, which included an Arthrex TightRope ABS, FiberTape, suture tape through the coracoid process through the distal clavicle.  Under direct visualization, the Dog Bone Button was advanced to the base of the coracoid.  Tension was held on the sutures.  Fluoroscopy was introduced.  Upward force was applied to the elbow and the [facility] was reduced.  All sutures were then fed into a second Dog Bone, which was

## 2016-02-19 NOTE — Op Note
PATIENT NAME:  James SchwabWILLIAM T Flynt  MRN:   [id number]  DATE OF SERVICE:  02/11/2016    PREOPERATIVE DIAGNOSIS:    1.  Left type III AC separation.  2.  Type 2 SLAP tear.    POSTOPERATIVE DIAGNOSES:  1.  Left type III AC separation.  2.  Type 2 SLAP tear.  3.  Partial-thickness supraspinatus/subscapularis tear.  4.  Synovitis.    PROCEDURES:  1.  Arthroscopic AC joint reconstruction.  2.  Major debridement.    SURGEON:  Riki Sheerhristopher Swanson, MD    ASSISTANT:  Wyline CopasKendra Hentkowski, PA-C    ANESTHESIA:  General.    ESTIMATED BLOOD LOSS:  Minimal.    IMPLANTS:  Arthrex Dog Bone Button x2.    COUNTS:  Correct.    CONDITION:  Stable to PACU, extubated.    INDICATIONS:  Mr. [name] is a 55 year old male who sustained injury to his left shoulder, resulting in an [facility] separation.  He was evaluated in outpatient Orthopedic Clinic today due to incidence of concomitant injuries and was sent for an MRI demonstrating the findings above.  The risks, benefits, alternatives of continued conservative versus operative intervention were discussed with the patient in great detail.  Given the patient's pain and the palpable deformity over his [facility], he did wish to proceed with operative intervention.  Informed consent and medical clearance were obtained.    BRIEF OPERATIVE NOTE:  Mr. [name] was identified in the preoperative holding area.  The correct surgical site was marked.  The patient was placed on a stretcher and taken to the Operating Room and placed in supine position on the operating table.  A formal timeout was had with all parties present.  The patient received general anesthesia plus interscalene block; please refer to Anesthesia notes for further details on this.  The patient received 2 grams IV Ancef for surgical prophylaxis.  The patient was placed into a modified beach chair position.  All bony prominences were padded appropriately.  The left upper extremity was

## 2016-02-19 NOTE — Op Note
prepped and draped in normal sterile fashion.  Fluoroscopy was introduced to identify the distal clavicle AC separation.  Standard posterior portal was made.  Arthroscope was introduced into the glenohumeral joint.  Under needle localization and direct visualization, an anterior portal was made in the rotator interval.  There was significant hypertrophic synovitis and rotator interval debrided using arthroscopic shaver.  An accessory anterior superolateral portal was made.  There was evidence of a type 2 SLAP tear, which was debrided using arthroscopic shaver.  The biceps tendon was intact.  There was fraying of the superior border of the subscapularis, debrided using arthroscopic shaver.  There was also partial undersurface fraying of the supraspinatus, which was debrided using arthroscopic shaver.  The remainder of the rotator cuff was intact.  At this point, utilizing our 2 anterior portals, we continued our synovectomy medially and identified in the coracoid process and then subsequently the base of the coracoid.  At this time, an incision was made in Langer's line over the distal clavicle.  Dissection was carried down through skin and subcutaneous tissue.  The muscle was split.  A retractor was placed.  A CC ligament reconstruction guide was then positioned at the base of the coracoid and subsequently on the superior border of the distal clavicle.  A drill hole was then performed, exiting at the base of the coracoid.  We then used a nitinol wire to shuttle a FiberLink.  This was then utilized to pass our sutures, which included an Arthrex TightRope ABS, FiberTape, suture tape through the coracoid process through the distal clavicle.  Under direct visualization, the Dog Bone Button was advanced to the base of the coracoid.  Tension was held on the sutures.  Fluoroscopy was introduced.  Upward force was applied to the elbow and the AC joint was reduced.  All sutures were then fed into a second Dog Bone, which was

## 2016-02-19 NOTE — Op Note
approximated to the clavicle and TightRope was used to tension the button.  Our reduction was confirmed under fluoroscopy.  Suture tape was then tied over the button, followed by FiberTape.  All suture tails were then cut.  The wound was copiously irrigated with normal saline solution.  Position of the Dog Bone on the coracoid was again confirmed under direct visualization.  All fluid and debris was removed from the glenohumeral joint using arthroscopic shaver.  Portals were closed using buried 3-0 Monocryl sutures.  Multilayer closure was performed using 0 Vicryl for the fascia, 2-0 Vicryl for subcutaneous layer, running 3-0 Monocryl for skin for the incision over the distal clavicle.  A sterile dressing was placed with Steri-Strips, sterile gauze and tape.  The patient was placed into an UltraSling, awakened from anesthesia and taken to Recovery Room in stable condition.    Kendra Hentkowski PA-C was essential for all aspects of this procedure including positioning, visualization debridement, reduction of the AC joint, passage of suture, placement of the button and closure.    PLAN:  Mr. Sliger will be nonweightbearing of his left upper extremity.  He will leave his dressing in place until Monday, at which point he may remove it to shower.  He will wear a sling at all times.  He was given Percocet for pain control.  He will follow up as an outpatient to begin range of motion and physical therapy.      Christopher E Swanson, MD    CS/NTS  DD:  02/11/2016 14:06:55  DT:  02/12/2016 03:31:37  Job#:  [id number]  Conf#:  [id number]

## 2016-02-19 NOTE — Op Note
PATIENT NAME:  James Flynn  MRN:   [id number]  DATE OF SERVICE:  02/11/2016    PREOPERATIVE DIAGNOSIS:    1.  Left type III AC separation.  2.  Type 2 SLAP tear.    POSTOPERATIVE DIAGNOSES:  1.  Left type III AC separation.  2.  Type 2 SLAP tear.  3.  Partial-thickness supraspinatus/subscapularis tear.  4.  Synovitis.    PROCEDURES:  1.  Arthroscopic AC joint reconstruction.  2.  Major debridement.    SURGEON:  Christopher Swanson, MD    ASSISTANT:  Kendra Hentkowski, PA-C    ANESTHESIA:  General.    ESTIMATED BLOOD LOSS:  Minimal.    IMPLANTS:  Arthrex Dog Bone Button x2.    COUNTS:  Correct.    CONDITION:  Stable to PACU, extubated.    INDICATIONS:  James Flynn is a 55-year-old male who sustained injury to his left shoulder, resulting in an AC separation.  He was evaluated in outpatient Orthopedic Clinic today due to incidence of concomitant injuries and was sent for an MRI demonstrating the findings above.  The risks, benefits, alternatives of continued conservative versus operative intervention were discussed with the patient in great detail.  Given the patient's pain and the palpable deformity over his AC joint, he did wish to proceed with operative intervention.  Informed consent and medical clearance were obtained.    BRIEF OPERATIVE NOTE:  James Flynn was identified in the preoperative holding area.  The correct surgical site was marked.  The patient was placed on a stretcher and taken to the Operating Room and placed in supine position on the operating table.  A formal timeout was had with all parties present.  The patient received general anesthesia plus interscalene block; please refer to Anesthesia notes for further details on this.  The patient received 2 grams IV Ancef for surgical prophylaxis.  The patient was placed into a modified beach chair position.  All bony prominences were padded appropriately.  The left upper extremity was

## 2016-02-22 ENCOUNTER — Encounter: Primary: Specialist

## 2016-02-25 ENCOUNTER — Encounter: Primary: Specialist

## 2016-02-29 ENCOUNTER — Inpatient Hospital Stay: Primary: Specialist

## 2016-03-03 ENCOUNTER — Encounter: Primary: Specialist

## 2016-03-06 ENCOUNTER — Encounter: Primary: Specialist

## 2016-03-10 ENCOUNTER — Encounter: Primary: Specialist

## 2016-03-13 ENCOUNTER — Encounter: Primary: Specialist

## 2016-03-17 ENCOUNTER — Encounter: Primary: Specialist

## 2016-04-18 NOTE — Discharge Summary
SHOULDER AROM (degrees) PROM (degrees)   ? Left Right Left Endfeel Right Endfeel   Flexion NT 170 90 Empty NT NA   Abduction/Scaption NT 164 60 Empty NT NA   Internal Rotation NT To T10 To chest wall NA NT NA   External Rotation NT To T5 0 Empty NT NA   ?  ?  Flexibility: Not assessed due to stage of healing/MD protocol restrictions  ?  ?  Upper Extremity Strength: L shoulder MMT to be assessed at a later date due to stage of healing/MD protocol restrictions  ?  SHOULDER Left Right   Flexion NT 5/5   Abduction NT 5/5   Internal Rotation NT 5/5   External Rotation NT 5/5   ?  ?  Upper Extremity Palpation:  ? Shoulder: Palpation reveals tenderness over the generalized L shoulder tenderness with palpation.  ?  ?  Joint Mobility Left Right   To be assessed at a later date ? ?   ? ? ?   ? ? ?   ? ? ?   ?  Sensory/Reflex/Peripheral Nerve Integrity: No reported deficits in upper extremity sensibility.  ?  Deep tendon reflexes:  ? None tested  ?  ?  Special Tests:  Shoulder:   ? None  ?  Elbow:  ? None  ?  Wrist/Hand:  ? None  ?  ?  Functional Mobility/Transfers: I with functional mobility/transfers  ?  ?  Outcome Measures:  ? FOTO Functional Status Measure: 45/100 (Full report available in Media Tab) (Initial evaluation)  ?  ?  Treatment: NA  ?  Total Treatment Time: NA  Total Timed Code Minutes: NA  ?  Procedures Charged:  ?NA  ?  ?  ?  Physical Therapy Plan of Care:   ?  Assessment: Decreased knowledge of therapeutic intervention, Decreased L shoulder range of motion, Decreased L shoulder and periscapular strength, Presence of edema, Increased pain, Decreased scapular stability, Impaired posture, Decreased ADL status and Decreased proprioception   ?  Following initial evaluation and [facility], their office returned the therapist's phone call and stated that patient was not supposed to begin PT until 03/10/16. No start

## 2016-04-18 NOTE — Discharge Summary
date was indicated on the original referral that indicated a delay in start from receipt of referral. Patient was on hold until 03/10/16 however failed to contact the clinic to schedule further PT. Patient was contacted and stated he was atttending another outpatient facility and would like to be D/C'd.  ?  PLAN:  PT Frequency: Discharge patient  ??  Treatment Duration: Discharge patient  ??    ?  ?  GOALS: *goals not met due to patient not returning to PT following initial evaluation  Short Term Goals: 6 Weeks  1. Patient will demonstrate I with HEP including AAROM exercises in order to be able to bathe himself with 1/10 or less pain with mod I.  ?  Long Term Goals: 12 Weeks  1. Patient will demonstrate L shoulder PROM to within 5 degrees of R side for FF and ABD and ER to 90 degrees in order to be able to don a shirt independently.  2. Patient will demonstrate L shoulder AROM to within 5 degrees of the R side for FF and ABD, and ER to at least T3 and IR to at least T12 in order to be able to bathe himself independently and without modification.  3. Patient will demonstrate L shoulder strength to at least 4+/5 in order to be able to lift a half gallon of milk into the refrigerator.  4. 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  ?  ?  _________________________  Houston Siren, PT  02/18/2016

## 2016-04-18 NOTE — Discharge Summary
Physical Therapy UE Orthopedic Discharge Summary  ?  ?  Name,DOB,MRN: James SchwabWilliam T Flynn, 02/04/1961, [id number]  Date of Service: 02/18/2016  Referring Provider: Darrel HooverSwanson, Christopher E,*  Primary Care: Alma DownsGulani, Arun C, MD  Insurance: Payor: TRICARE / Plan: TRICARE STANDARD / Product Type: Government /   Comments: Initial FOTO completed; NWB L UE, follow Dr. Andy GaussSwanson's AC joint recontruction protocol; G-codes at 10th visit  ?  Diagnosis Information:   M25.512 (ICD-10-CM) - Pain in left shoulder   S43.102D (ICD-10-CM) - Unspecified dislocation of left acromioclavicular joint, subsequent encounter   ?   Start of Initial Care Date: 02/18/2016  Date Ranges for Reporting Period:?02/18/2016 - 02/18/2016    Visit Information:  ? Visit Number: 1  ? Left Without Being Seen Number: 0  ? Canceled Number: 0  ? No Show Number: 0    Subjective:   ?  Following initial evaluation and [facility], their office returned the therapist's phone call and stated that patient was not supposed to begin PT until 03/10/16. No start date was indicated on the original referral that indicated a delay in start from receipt of referral. Patient was on hold until 03/10/16 however failed to contact the clinic to schedule further PT. Patient was contacted and stated he was atttending another outpatient facility and would like to be D/C'd.  ?  ?  * Was an interpreter used: NA  ?  Vital Signs and Oxygen Therapy:               ? Blood pressure: NA  ? Pulse: NA                        SaO2: NA  ?  Pain Assessment:  ? Pre Treatment Pain Rating: NA  ? Post Treatment Pain Rating: NA  ? Pain Location/Orientation: NA                        ? Pain Descriptors: NA  ?    ?  Objective:   ?  *Objective data from initial evaluation       Cervical Spine Comments: cervical ROM WNL  ?  ?  Upper Extremity ROM: L shoulder AROM to be assessed at a later date due to stage of healing/MD protocol restrictions  ?

## 2016-05-24 ENCOUNTER — Emergency Department: Admit: 2016-05-24 | Discharge: 2016-05-24

## 2016-05-24 ENCOUNTER — Inpatient Hospital Stay: Admit: 2016-05-24 | Discharge: 2016-05-24

## 2016-05-24 DIAGNOSIS — S060X9A Concussion with loss of consciousness of unspecified duration, initial encounter: Secondary | ICD-10-CM

## 2016-05-24 DIAGNOSIS — F17210 Nicotine dependence, cigarettes, uncomplicated: Secondary | ICD-10-CM

## 2016-05-24 DIAGNOSIS — R402412 Glasgow coma scale score 13-15, at arrival to emergency department: Secondary | ICD-10-CM

## 2016-05-24 DIAGNOSIS — E785 Hyperlipidemia, unspecified: Secondary | ICD-10-CM

## 2016-05-24 DIAGNOSIS — R51 Headache: Principal | ICD-10-CM

## 2016-05-24 DIAGNOSIS — F10129 Alcohol abuse with intoxication, unspecified: Secondary | ICD-10-CM

## 2016-05-24 DIAGNOSIS — F10929 Alcohol use, unspecified with intoxication, unspecified: Secondary | ICD-10-CM

## 2016-05-24 DIAGNOSIS — I10 Essential (primary) hypertension: Secondary | ICD-10-CM

## 2016-05-24 DIAGNOSIS — Z79899 Other long term (current) drug therapy: Secondary | ICD-10-CM

## 2016-05-24 MED ORDER — FOLIC ACID 1 MG PO TABS
1 mg | Freq: Once | ORAL | 0 refills | Status: CP
Start: 2016-05-24 — End: ?

## 2016-05-24 MED ORDER — THIAMINE HCL 100 MG PO TABS
100 mg | Freq: Once | ORAL | Status: CP
Start: 2016-05-24 — End: ?

## 2016-05-24 MED ORDER — HYDROCODONE-ACETAMINOPHEN 5-325 MG PO TABS
1 | ORAL_TABLET | Freq: Once | ORAL | Status: CP
Start: 2016-05-24 — End: ?

## 2016-05-24 MED ORDER — FOLIC ACID 1 MG PO TABS
1 mg | Freq: Once | ORAL | Status: CP
Start: 2016-05-24 — End: ?

## 2016-05-24 MED ORDER — THIAMINE HCL 100 MG PO TABS
100 mg | Freq: Once | ORAL | 0 refills | Status: CP
Start: 2016-05-24 — End: ?

## 2016-05-24 MED ORDER — TETANUS-DIPHTH-ACELL PERTUSSIS 5-2-15.5 LF-MCG/0.5 IM SUSP
.5 mL | Freq: Once | INTRAMUSCULAR | Status: DC
Start: 2016-05-24 — End: 2016-05-24
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
## 2016-05-24 NOTE — ED Notes
Return from CT. Tolerated well. No complaints offered.

## 2016-05-24 NOTE — ED Notes
To CT via stretcher in fair condition. Accompanied by EDT Jill AlexandersJustin.
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
## 2017-10-23 ENCOUNTER — Ambulatory Visit (HOSPITAL_BASED_OUTPATIENT_CLINIC_OR_DEPARTMENT_OTHER)
Admission: RE | Admit: 2017-10-23 | Discharge: 2017-10-23 | Disposition: A | Discharge status: Home / Self Care | Source: Ambulatory Visit | Attending: Medical | Admitting: Medical

## 2017-10-23 ENCOUNTER — Encounter: Payer: Self-pay | Admitting: Medical

## 2017-10-23 ENCOUNTER — Ambulatory Visit (INDEPENDENT_AMBULATORY_CARE_PROVIDER_SITE_OTHER): Admitting: Medical

## 2017-10-23 VITALS — BP 128/80 | HR 86 | Resp 16 | Ht 74.0 in | Wt 185.8 lb

## 2017-10-23 DIAGNOSIS — G3281 Cerebellar ataxia in diseases classified elsewhere: Secondary | ICD-10-CM

## 2017-10-23 DIAGNOSIS — R202 Paresthesia of skin: Secondary | ICD-10-CM

## 2017-10-23 DIAGNOSIS — F329 Major depressive disorder, single episode, unspecified: Secondary | ICD-10-CM

## 2017-10-23 DIAGNOSIS — R42 Dizziness and giddiness: Secondary | ICD-10-CM

## 2017-10-23 DIAGNOSIS — W57XXXA Bitten or stung by nonvenomous insect and other nonvenomous arthropods, initial encounter: Secondary | ICD-10-CM

## 2017-10-23 DIAGNOSIS — R5383 Other fatigue: Secondary | ICD-10-CM

## 2017-10-23 DIAGNOSIS — R269 Unspecified abnormalities of gait and mobility: Secondary | ICD-10-CM

## 2017-10-23 DIAGNOSIS — N3941 Urge incontinence: Secondary | ICD-10-CM | POA: Diagnosis not present

## 2017-10-23 DIAGNOSIS — F32A Depression, unspecified: Secondary | ICD-10-CM

## 2017-10-23 DIAGNOSIS — R030 Elevated blood-pressure reading, without diagnosis of hypertension: Secondary | ICD-10-CM

## 2017-10-23 DIAGNOSIS — R531 Weakness: Secondary | ICD-10-CM | POA: Diagnosis not present

## 2017-10-23 LAB — CBC WITH DIFFERENTIAL/PLATELET
BASOS PCT: 1 % (ref 0.0–3.0)
Basophils Absolute: 0.1 10*3/uL (ref 0.0–0.1)
EOS PCT: 2.7 % (ref 0.0–5.0)
Eosinophils Absolute: 0.3 10*3/uL (ref 0.0–0.7)
HEMATOCRIT: 43 % (ref 39.0–52.0)
HEMOGLOBIN: 14.7 g/dL (ref 13.0–17.0)
Lymphocytes Relative: 31.1 % (ref 12.0–46.0)
Lymphs Abs: 3.1 10*3/uL (ref 0.7–4.0)
MCHC: 34.2 g/dL (ref 30.0–36.0)
MCV: 99.2 fl (ref 78.0–100.0)
MONOS PCT: 6.2 % (ref 3.0–12.0)
Monocytes Absolute: 0.6 10*3/uL (ref 0.1–1.0)
Neutro Abs: 5.9 10*3/uL (ref 1.4–7.7)
Neutrophils Relative %: 59 % (ref 43.0–77.0)
Platelets: 331 10*3/uL (ref 150.0–400.0)
RBC: 4.33 Mil/uL (ref 4.22–5.81)
RDW: 12.6 % (ref 11.5–15.5)
WBC: 10 10*3/uL (ref 4.0–10.5)

## 2017-10-23 LAB — COMPREHENSIVE METABOLIC PANEL WITH GFR
ALT: 19 U/L (ref 0–53)
AST: 20 U/L (ref 0–37)
Albumin: 4.5 g/dL (ref 3.5–5.2)
Alkaline Phosphatase: 61 U/L (ref 39–117)
BUN: 12 mg/dL (ref 6–23)
CO2: 27 meq/L (ref 19–32)
Calcium: 9.9 mg/dL (ref 8.4–10.5)
Chloride: 104 meq/L (ref 96–112)
Creatinine, Ser: 0.9 mg/dL (ref 0.40–1.50)
GFR: 93.2 mL/min (ref >60.00)
Glucose, Bld: 97 mg/dL (ref 70–99)
Potassium: 5 meq/L (ref 3.5–5.1)
Sodium: 139 meq/L (ref 135–145)
Total Bilirubin: 0.3 mg/dL (ref 0.2–1.2)
Total Protein: 6.9 g/dL (ref 6.0–8.3)

## 2017-10-23 LAB — VITAMIN D 25 HYDROXY (VIT D DEFICIENCY, FRACTURES): VITD: 25.73 ng/mL — ABNORMAL LOW (ref 30.00–100.00)

## 2017-10-23 LAB — TSH: TSH: 1.99 u[IU]/mL (ref 0.35–4.50)

## 2017-10-23 LAB — VITAMIN B12: Vitamin B-12: 565 pg/mL (ref 211–911)

## 2017-10-23 LAB — PSA: PSA: 1.36 ng/mL (ref 0.10–4.00)

## 2017-10-23 NOTE — Progress Notes (Signed)
Subjective:    Patient ID: Bryan Lane., male    DOB: 03-26-1961, 57 y.o.   MRN: [id number]  HPI  Pt in for first time.   Pt worked Surveyor, mining. Currenlty not working(quit job [employer] after 36 hour work phone call) Works out very rare.  5 cups of coffee. On read bull a day. Does smoke. Half pack a day. Alcohol. When working was drinking 4 beers about 4 days a week. Since not working has not drank alcohol at all.   Pt states he just [place] . He traveled 3 weeks on trail. Went 46 miles. He states has been back from trail about one week ago. Pt states for a while he has been having some gait difficulty. He states finger tips feel slick. Finger tips feel numb.(describes paresthesia). Some dizziness as well. These symptoms started to occur shortly after he resigned from high stress job. He quit job, cashed out his 85 k and then got out of lease. Then he went on [place] [address].(pt is not aware of any known tick bites but in trail for 3 weeks)  No history of ha's, no eye pain or blurred. Vision.   Pt also at one point in past got some permethrin in past. He was concerened for skin parasite. He would put this on every night for over a year.   He got of trail due to dizziness and fatigue. Pt went to pharmacy after he got off trail. They checked his bp and was 170/120. Next day he went to firestation and bp was 150/100. Today when MA checked it was 127/100.   Pt had some depression in past related to divorce. Some past 3 years. Never took meds.   Review of Systems  Constitutional: Positive for fatigue. Negative for chills and fever.  Respiratory: Negative for cough, chest tightness, shortness of breath and wheezing.   Cardiovascular: Negative for chest pain and palpitations.  Gastrointestinal: Negative for abdominal pain and anal bleeding.  Genitourinary: Negative for discharge, dysuria, flank pain, frequency and testicular pain.       Some severe urge  to urinate at times. 2-3 times realize need to urinate and had to use bathroom quickly.  Musculoskeletal: Negative for back pain.       No severe back pain. No pain shooting to his legs.  Skin: Negative for rash.  Neurological: Positive for weakness and light-headedness. Negative for dizziness, syncope and headaches.       Feels overall weak.  Hematological: Negative for adenopathy. Does not bruise/bleed easily.  Psychiatric/Behavioral: Negative for behavioral problems, confusion and dysphoric mood. The patient is not nervous/anxious and is not hyperactive.    Past Medical History:  Diagnosis Date  . Hypertension      Social History   Socioeconomic History  . Marital status: Single    Spouse name: Not on file  . Number of children: Not on file  . Years of education: Not on file  . Highest education level: Not on file  Occupational History  . Not on file  Social Needs  . Financial resource strain: Not on file  . Food insecurity:    Worry: Not on file    Inability: Not on file  . Transportation needs:    Medical: Not on file    Non-medical: Not on file  Tobacco Use  . Smoking status: Current Every Day Smoker  . Smokeless tobacco: Never Used  Substance and Sexual Activity  . Alcohol use: Not  Currently  . Drug use: Yes    Types: Marijuana, Cocaine    Comment: In past but not currently  . Sexual activity: Not on file  Lifestyle  . Physical activity:    Days per week: Not on file    Minutes per session: Not on file  . Stress: Not on file  Relationships  . Social connections:    Talks on phone: Not on file    Gets together: Not on file    Attends religious service: Not on file    Active member of club or organization: Not on file    Attends meetings of clubs or organizations: Not on file    Relationship status: Not on file  . Intimate partner violence:    Fear of current or ex partner: Not on file    Emotionally abused: Not on file    Physically abused: Not on file      Forced sexual activity: Not on file  Other Topics Concern  . Not on file  Social History Narrative  . Not on file     No family history on file.  Allergies not on file  No current outpatient medications on file prior to visit.   No current facility-administered medications on file prior to visit.     BP (!) 127/100 (BP Location: Left Arm, Patient Position: Sitting, Cuff Size: Normal)   Pulse 86   Resp 16   Ht 6\' 2"  (1.88 m)   Wt 185 lb 12.8 oz (84.3 kg)   SpO2 100%   BMI 23.86 kg/m       Objective:   Physical Exam  General Mental Status- Alert. General Appearance- Not in acute distress.   Skin General: Color- Normal Color. Moisture- Normal Moisture.  Neck Carotid Arteries- Normal color. Moisture- Normal Moisture. No carotid bruits. No JVD.  Chest and Lung Exam Auscultation: Breath Sounds:-Normal.  Cardiovascular Auscultation:Rythm- Regular. Murmurs & Other Heart Sounds:Auscultation of the heart reveals- No Murmurs.  Abdomen Inspection:-Inspeection Normal. Palpation/Percussion:Note:No mass. Palpation and Percussion of the abdomen reveal- Non Tender, Non Distended + BS, no rebound or guarding.    Neurologic Cranial Nerve exam:- CN III-XII intact(No nystagmus), symmetric smile. Drift Test:- No drift. Romberg Exam:- Negative.  Heal to Toe Gait exam:-difficult heal to toe. Finger to Nose:- Normal/Intact Strength:- 4/5 equal and symmetric strength both upper and lower extremities. Walks with wide gate. Left hand sharp and dull discrimination not intact. Rt hand- sharp/dull discimination intact.  Skin- on inspection left hamstring area possible tick bite sit(pt points out)    Assessment & Plan:  For your elevated you have various symptoms today which include fatigue, dizziness, paresthesia, urge incontinence, gait disturbance, depression and recent elevated blood pressures.  We will do the following labs CBC, CMP, TSH, vitamin D, B12, B1, tick bite  studies, PSA and CT head without contrast.  I want you to decrease your caffeine intake by at least a half and cut smoking by half at least as well.  This might improve your blood pressure.  Check blood pressures daily and notify me in 5 days on your blood pressure readings.  Try to rest as much as possible and keep yourself hydrated.  I am referring you to a neurologist.  If you have worse weakening or other neurologic signs or symptoms as discussed then recommend ED evaluation.  Would recommend that be done at the main [facility] as they might be able to consult neurologist if needed.  Follow-up in 7 days or  as needed.  45 minute spent with pt. 50% of time discussion differerential diagnosis, work up today and plan going forward for various signs and symptoms. Plan also regarding drastic change in signs and symptoms if such were to occur.  Mackie Pai, PA-C

## 2017-10-23 NOTE — Patient Instructions (Signed)
For your elevated you have various symptoms today which include fatigue, dizziness, paresthesia, urge incontinence, gait disturbance, depression and recent elevated blood pressures.  We will do the following labs CBC, CMP, TSH, vitamin D, B12, B1, tick bite studies, PSA and CT head without contrast.  I want you to decrease your caffeine intake by at least a half and cut smoking by half at least as well.  This might improve your blood pressure.  Check blood pressures daily and notify me in 5 days on your blood pressure readings.  Try to rest as much as possible and keep yourself hydrated.  I am referring you to a neurologist.  If you have worse weakening or other neurologic signs or symptoms as discussed then recommend ED evaluation.  Would recommend that be done at the main [facility] as they might be able to consult neurologist if needed.  Follow-up in 7 days or as needed.

## 2017-10-24 ENCOUNTER — Encounter: Payer: Self-pay | Admitting: Neurology

## 2017-10-24 ENCOUNTER — Telehealth: Payer: Self-pay | Admitting: Medical

## 2017-10-24 MED ORDER — VITAMIN D (ERGOCALCIFEROL) 1.25 MG (50000 UNIT) PO CAPS: 50000.0000 [IU] | ORAL_CAPSULE | ORAL | 0 refills | Status: DC

## 2017-10-24 NOTE — Telephone Encounter (Signed)
Rx vitamin D sent to patient pharmacy.

## 2017-10-25 MED FILL — VIT D2 1.25 MG (50,000 UNIT: 1.25 MG | 28 days supply | Qty: 4 | Fill #0

## 2017-10-26 LAB — ROCKY MTN SPOTTED FVR ABS PNL(IGG+IGM)
RMSF IGM: NOT DETECTED
RMSF IgG: NOT DETECTED

## 2017-10-26 LAB — B. BURGDORFI ANTIBODIES: B burgdorferi Ab IgG+IgM: <0.9 index

## 2017-10-26 LAB — VITAMIN B1: VITAMIN B1 (THIAMINE): 10 nmol/L (ref 8–30)

## 2017-10-30 ENCOUNTER — Encounter: Payer: Self-pay | Admitting: Medical

## 2017-10-30 ENCOUNTER — Ambulatory Visit (INDEPENDENT_AMBULATORY_CARE_PROVIDER_SITE_OTHER): Admitting: Medical

## 2017-10-30 VITALS — BP 125/75 | HR 75 | Temp 97.7°F | Resp 16 | Ht 74.0 in | Wt 196.4 lb

## 2017-10-30 DIAGNOSIS — E785 Hyperlipidemia, unspecified: Secondary | ICD-10-CM | POA: Diagnosis not present

## 2017-10-30 DIAGNOSIS — R269 Unspecified abnormalities of gait and mobility: Secondary | ICD-10-CM

## 2017-10-30 DIAGNOSIS — F17200 Nicotine dependence, unspecified, uncomplicated: Secondary | ICD-10-CM | POA: Diagnosis not present

## 2017-10-30 DIAGNOSIS — R202 Paresthesia of skin: Secondary | ICD-10-CM | POA: Diagnosis not present

## 2017-10-30 NOTE — Patient Instructions (Signed)
Your recent chronic muscle weakness, paresthesia type symptoms and gait disturbance symptoms are still present but stable.  Your work-up was negative except for low vitamin D.  Please continue that.  You do have a neurology appointment in July.  If you feel like your symptoms are gradually getting worse then let me know and I will can try to get your appointment bumped up sooner or may be try other local neurology offices.  If you have any dramatic change in your symptoms prior to neurology visit then recommend that she be seen in emergency department.  Sometimes he can get a stat neurology consult if symptoms are severe enough.  I do recommend that you continue to stop smoking.  Continue to check your blood pressure daily.  Recently your blood pressure readings are close to 130/80.  If your blood pressure readings are over 140/90 then would restart you on blood pressure medication.  Please get fasting lipid panel done within the next week.  I put in a future order.  You will need to be placed on the lab schedule.  Follow-up in 3 weeks or as needed.

## 2017-10-30 NOTE — Progress Notes (Signed)
Subjective:    Patient ID: Bryan Lane., male    DOB: 21-Nov-1960, 57 y.o.   MRN: [id number]  HPI   Pt in for follow up.  Pt states he feels pretty good. He still feels fatigued. Still reports left lower extremity weakness. But legs are not weaker than before. Legs feel stiff. Pt has appointment with neurologist on January 14, 2018. Pt has history of fatigue, paresthesia, dizziness and abnormal gait. Pt thinks he may have effects from permethrin excessively for 3 years. Applied 3 days a week for 3 years. Then 3 weeks prior [place]. He did this due to fear for parasitic infection. His skin has gotten better. He describes some inflamed thick patches to skin.   Pt labs done for work up were negative except vit D was low. He is just made aware of rx just today. He will get that filled.  Pt has been checking his blood pressure. His bp level have been 130/80 on average. Bp readings have been 137/89, 132/84, 129/82, 127/79, and 120/78.  Pt has history of high cholesterol. Dx 3 years. Pt was on statin. He thought this caused muscle mass loss.     Review of Systems  Constitutional: Positive for fatigue. Negative for chills and fever.       See hpi.  Respiratory: Negative for cough, chest tightness, shortness of breath and wheezing.   Cardiovascular: Negative for chest pain and palpitations.  Gastrointestinal: Negative for abdominal distention, abdominal pain, constipation and diarrhea.  Musculoskeletal: Negative for back pain, gait problem and joint swelling.  Skin: Negative for rash.  Neurological: Positive for weakness. Negative for dizziness, seizures, speech difficulty and headaches.  Hematological: Negative for adenopathy. Does not bruise/bleed easily.  Psychiatric/Behavioral: Negative for behavioral problems, confusion and sleep disturbance. The patient is not nervous/anxious.     Past Medical History:  Diagnosis Date  . Depression   . Hypertension    2 years ago  was on med for bp. hctz.     Social History   Socioeconomic History  . Marital status: Single    Spouse name: Not on file  . Number of children: Not on file  . Years of education: Not on file  . Highest education level: Not on file  Occupational History  . Not on file  Social Needs  . Financial resource strain: Not on file  . Food insecurity:    Worry: Not on file    Inability: Not on file  . Transportation needs:    Medical: Not on file    Non-medical: Not on file  Tobacco Use  . Smoking status: Current Every Day Smoker  . Smokeless tobacco: Never Used  Substance and Sexual Activity  . 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  . Drug use: Yes    Types: Marijuana, Cocaine    Comment: In past but not currently(coccaine last year). used marijuana when can.  . Sexual activity: Not Currently  Lifestyle  . Physical activity:    Days per week: Not on file    Minutes per session: Not on file  . Stress: Not on file  Relationships  . Social connections:    Talks on phone: Not on file    Gets together: Not on file    Attends religious service: Not on file    Active member of club or organization: Not on file    Attends meetings of clubs or organizations: Not on file  Relationship status: Not on file  . Intimate partner violence:    Fear of current or ex partner: Not on file    Emotionally abused: Not on file    Physically abused: Not on file    Forced sexual activity: Not on file  Other Topics Concern  . Not on file  Social History Narrative  . Not on file    Past Surgical History:  Procedure Laterality Date  . SHOULDER SURGERY  2017    No family history on file.  Not on File  Current Outpatient Medications on File Prior to Visit  Medication Sig Dispense Refill  . Vitamin D, Ergocalciferol, (DRISDOL) 50000 units CAPS capsule Take 1 capsule (50,000 Units total) by mouth every 7 (seven) days. 4 capsule 0   No current  facility-administered medications on file prior to visit.     BP (!) 139/91   Pulse 75   Temp 97.7 F (36.5 C) (Oral)   Resp 16   Ht 6\' 2"  (1.88 m)   Wt 196 lb 6.4 oz (89.1 kg)   SpO2 100%   BMI 25.22 kg/m       Objective:   Physical Exam  General Mental Status- Alert. General Appearance- Not in acute distress.   Skin General: Color- Normal Color. Moisture- Normal Moisture.  Neck Carotid Arteries- Normal color. Moisture- Normal Moisture. No carotid bruits. No JVD.  Chest and Lung Exam Auscultation: Breath Sounds:-Normal.  Cardiovascular Auscultation:Rythm- Regular. Murmurs & Other Heart Sounds:Auscultation of the heart reveals- No Murmurs.  Abdomen Inspection:-Inspeection Normal. Palpation/Percussion:Note:No mass. Palpation and Percussion of the abdomen reveal- Non Tender, Non Distended + BS, no rebound or guarding.    Neurologic Cranial Nerve exam:- CN III-XII intact(No nystagmus), symmetric smile. Drift Test:- No drift. Romberg Exam:- Negative.  Heal to Toe Gait exam:-difficult heal to toe. Finger to Nose:- Normal/Intact Strength:- 4/5 equal and symmetric strength both upper and lower extremities.       Assessment & Plan:  Your recent chronic muscle weakness, paresthesia type symptoms and gait disturbance symptoms are still present but stable.  Your work-up was negative except for low vitamin D.  Please continue that.  You do have a neurology appointment in July.  If you feel like your symptoms are gradually getting worse then let me know and I will can try to get your appointment bumped up sooner or may be try other local neurology offices.  If you have any dramatic change in your symptoms prior to neurology visit then recommend that she be seen in emergency department.  Sometimes he can get a stat neurology consult if symptoms are severe enough.  I do recommend that you continue to stop smoking.  Continue to check your blood pressure daily.   Recently your blood pressure readings are close to 130/80.  If your blood pressure readings are over 140/90 then would restart you on blood pressure medication.  Please get fasting lipid panel done within the next week.  I put in a future order.  You will need to be placed on the lab schedule.  Follow-up in 3 weeks or as needed.  Mackie Pai, PA-C

## 2017-10-31 ENCOUNTER — Other Ambulatory Visit (INDEPENDENT_AMBULATORY_CARE_PROVIDER_SITE_OTHER)

## 2017-10-31 DIAGNOSIS — E785 Hyperlipidemia, unspecified: Secondary | ICD-10-CM

## 2017-10-31 LAB — LIPID PANEL
CHOLESTEROL: 177 mg/dL (ref 0–200)
HDL: 64.6 mg/dL (ref >39.00)
LDL Cholesterol: 98 mg/dL (ref 0–99)
NonHDL: 112.7
TRIGLYCERIDES: 72 mg/dL (ref 0.0–149.0)
Total CHOL/HDL Ratio: 3
VLDL: 14.4 mg/dL (ref 0.0–40.0)

## 2017-11-20 ENCOUNTER — Ambulatory Visit (INDEPENDENT_AMBULATORY_CARE_PROVIDER_SITE_OTHER): Admitting: Medical

## 2017-11-20 ENCOUNTER — Encounter: Payer: Self-pay | Admitting: Medical

## 2017-11-20 VITALS — BP 150/88 | HR 83 | Temp 97.6°F | Resp 16 | Ht 74.0 in | Wt 200.4 lb

## 2017-11-20 DIAGNOSIS — R29818 Other symptoms and signs involving the nervous system: Secondary | ICD-10-CM | POA: Diagnosis not present

## 2017-11-20 DIAGNOSIS — M79601 Pain in right arm: Secondary | ICD-10-CM

## 2017-11-20 DIAGNOSIS — M79602 Pain in left arm: Secondary | ICD-10-CM

## 2017-11-20 DIAGNOSIS — R269 Unspecified abnormalities of gait and mobility: Secondary | ICD-10-CM

## 2017-11-20 DIAGNOSIS — M6281 Muscle weakness (generalized): Secondary | ICD-10-CM | POA: Diagnosis not present

## 2017-11-20 DIAGNOSIS — N39498 Other specified urinary incontinence: Secondary | ICD-10-CM

## 2017-11-20 DIAGNOSIS — R202 Paresthesia of skin: Secondary | ICD-10-CM | POA: Diagnosis not present

## 2017-11-20 DIAGNOSIS — R2689 Other abnormalities of gait and mobility: Secondary | ICD-10-CM

## 2017-11-20 LAB — SEDIMENTATION RATE: SED RATE: 1 mm/h (ref 0–20)

## 2017-11-20 LAB — C-REACTIVE PROTEIN: CRP: 0.1 mg/dL — ABNORMAL LOW (ref 0.5–20.0)

## 2017-11-20 MED ORDER — PREDNISONE 10 MG PO TABS: ORAL_TABLET | ORAL | 0 refills | Status: DC

## 2017-11-20 MED FILL — predniSONE 10 MG TABS: 10 | 8 days supply | Qty: 37 | Fill #0

## 2017-11-20 NOTE — Patient Instructions (Addendum)
For your history of poor balance, muscle weakness, paresthesias abnormal gait and episodes of urinary incontinence, I am trying to get an MRI of your brain authorized.  During the interim, I want you to go ahead and start taper prednisone course.  I sent that prescription to the pharmacy downstairs.  For your history of some myalgias with above symptoms, I am getting ANA, C-reactive protein, sed rate and RA blood work.  I am going to try to contact the neurology office that we had referred you to an update them on your progressively worsening condition.  See if they can get you in sooner.  As I explained today, if your symptoms worsen despite the above measures then recommend you be evaluated a call in hospital ED.  Further work-up could be done there and neurology consult could be done as well.  I did discuss case with Dr. Charlett Blake today in office.  Please update me in 2 days on how you are doing with the taper prednisone course.  Follow-up in 7 days or as needed.

## 2017-11-20 NOTE — Progress Notes (Signed)
Subjective:    Patient ID: Bryan Lane., male    DOB: [birth year], 57 y.o.   MRN: [id number]  HPI  Pt in states he is still feeling weakness, fatigue, paresthesias, abnormal wide gait.  I have referred him to neurologist. He states he now has appointment on July 29,2019.   His joints feel are stiff. Describes weakness.  In past he I had done cbc, cmp, tsh, vitamin D, b12, b1 level and tick bite studies.   Labs were negative.  Pt states his muscles feel tight. He states and feels rigid. States has to relax before he walks.  Reviewed that he used promethum and ivermectum daily for 3 months daily before going [place]. Weakness started to occur before he went on trail.  Over all he feels progressively worse than on last visit.   Review of Systems  Constitutional: Negative for chills, fatigue and fever.  Respiratory: Negative for cough, chest tightness, shortness of breath and wheezing.   Cardiovascular: Negative for chest pain and palpitations.  Gastrointestinal: Negative for abdominal pain.  Genitourinary: Negative for decreased urine volume, dysuria, genital sores, penile swelling and testicular pain.       If he does not go to bathroom immediately. Some urinary incontinence.   Has numb sensation. When he wipes can't tell difference between paper and his skin near rectum.    Musculoskeletal: Negative for back pain.       Muscles around joints feel sore.  Skin: Negative for rash.  Neurological: Positive for weakness. Negative for dizziness, seizures, speech difficulty, light-headedness and headaches.       Poor balance.  Hematological: Negative for adenopathy. Does not bruise/bleed easily.  Psychiatric/Behavioral: Negative for behavioral problems, decreased concentration, self-injury and suicidal ideas. The patient is not nervous/anxious and is not hyperactive.        Objective:   Physical Exam  General Mental Status- Alert. General Appearance- Not in  acute distress.   Skin General: Color- Normal Color. Moisture- Normal Moisture.  Neck Carotid Arteries- Normal color. Moisture- Normal Moisture. No carotid bruits. No JVD.  Chest and Lung Exam Auscultation: Breath Sounds:-Normal.  Cardiovascular Auscultation:Rythm- Regular. Murmurs & Other Heart Sounds:Auscultation of the heart reveals- No Murmurs.  Abdomen Inspection:-Inspeection Normal. Palpation/Percussion:Note:No mass. Palpation and Percussion of the abdomen reveal- Non Tender, Non Distended + BS, no rebound or guarding.   Neurologic Cranial Nerve exam:- CN III-XII intact(No nystagmus), symmetric smile. Drift Test:- No drift. Romberg Exam:- Negative.  Heal to Toe Gait exam:-abnormal gait. Fails heal to toe.(walks wide stance and on his heels) Finger to Nose:- Normal/Intact Strength:- 2-/5 equal and symmetric upper ext strength. Lower ext 5/5 strenth.      Assessment & Plan:  For your history of poor balance, muscle weakness, paresthesias abnormal gait and episodes of urinary incontinence, I am trying to get an MRI of your brain authorized.  During the interim, I want you to go ahead and start taper prednisone course.  I sent that prescription to the pharmacy downstairs.  For your history of some myalgias with above symptoms, I am getting ANA, C-reactive protein, sed rate and RA blood work.  I am going to try to contact the neurology office that we had referred you to an update them on your progressively worsening condition.  See if they can get you in sooner.  As I explained today, if your symptoms worsen despite the above measures then recommend you be evaluated a call in hospital ED.  Further work-up could be  done there and neurology consult could be done as well.  I did discuss case with Dr. Charlett Blake today in office.  Please update me in 2 days on how you are doing with the taper prednisone course.  Follow-up in 7 days or as needed.  Mackie Pai, PA-C

## 2017-11-21 ENCOUNTER — Ambulatory Visit (HOSPITAL_COMMUNITY)
Admission: RE | Admit: 2017-11-21 | Discharge: 2017-11-21 | Disposition: A | Discharge status: Home / Self Care | Source: Ambulatory Visit | Attending: Medical | Admitting: Medical

## 2017-11-21 ENCOUNTER — Encounter: Payer: Self-pay | Admitting: Medical

## 2017-11-21 DIAGNOSIS — R2689 Other abnormalities of gait and mobility: Secondary | ICD-10-CM

## 2017-11-21 DIAGNOSIS — R202 Paresthesia of skin: Secondary | ICD-10-CM | POA: Insufficient documentation

## 2017-11-21 DIAGNOSIS — R29818 Other symptoms and signs involving the nervous system: Secondary | ICD-10-CM | POA: Diagnosis not present

## 2017-11-21 DIAGNOSIS — R269 Unspecified abnormalities of gait and mobility: Secondary | ICD-10-CM

## 2017-11-21 DIAGNOSIS — M79601 Pain in right arm: Secondary | ICD-10-CM | POA: Diagnosis not present

## 2017-11-21 DIAGNOSIS — N39498 Other specified urinary incontinence: Secondary | ICD-10-CM

## 2017-11-21 DIAGNOSIS — M6281 Muscle weakness (generalized): Secondary | ICD-10-CM | POA: Insufficient documentation

## 2017-11-21 DIAGNOSIS — M79602 Pain in left arm: Secondary | ICD-10-CM | POA: Diagnosis not present

## 2017-11-22 LAB — ANA: ANA: NEGATIVE

## 2017-11-22 LAB — RHEUMATOID FACTOR: Rhuematoid fact SerPl-aCnc: <14 IU/mL (ref <14)

## 2017-11-26 ENCOUNTER — Telehealth: Payer: Self-pay | Admitting: Medical

## 2017-11-26 NOTE — Telephone Encounter (Signed)
Would you mind calling neurologist office and seeing if they can see Mr. Bryan Lane sooner if possible. Maybe get him on cancellation list. If they could get Dr. Who will see him in July to review mri which was negative and ask that last office note with me be reviewed.  Thanks,

## 2017-11-27 ENCOUNTER — Encounter: Payer: Self-pay | Admitting: Medical

## 2017-11-27 ENCOUNTER — Telehealth: Payer: Self-pay | Admitting: Medical

## 2017-11-27 DIAGNOSIS — R700 Elevated erythrocyte sedimentation rate: Secondary | ICD-10-CM

## 2017-11-27 DIAGNOSIS — M791 Myalgia, unspecified site: Secondary | ICD-10-CM

## 2017-11-29 ENCOUNTER — Emergency Department (HOSPITAL_COMMUNITY)
Admission: EM | Admit: 2017-11-29 | Discharge: 2017-11-29 | Disposition: A | Discharge status: Home / Self Care | Attending: Emergency Medicine | Admitting: Emergency Medicine

## 2017-11-29 ENCOUNTER — Encounter (HOSPITAL_COMMUNITY): Payer: Self-pay | Admitting: Emergency Medicine

## 2017-11-29 ENCOUNTER — Telehealth: Payer: Self-pay | Admitting: Medical

## 2017-11-29 ENCOUNTER — Other Ambulatory Visit: Payer: Self-pay

## 2017-11-29 DIAGNOSIS — R531 Weakness: Secondary | ICD-10-CM | POA: Diagnosis not present

## 2017-11-29 DIAGNOSIS — I10 Essential (primary) hypertension: Secondary | ICD-10-CM | POA: Insufficient documentation

## 2017-11-29 DIAGNOSIS — F17200 Nicotine dependence, unspecified, uncomplicated: Secondary | ICD-10-CM | POA: Diagnosis not present

## 2017-11-29 LAB — CBC
HCT: 44.9 % (ref 39.0–52.0)
HEMOGLOBIN: 14.5 g/dL (ref 13.0–17.0)
MCH: 31.7 pg (ref 26.0–34.0)
MCHC: 32.3 g/dL (ref 30.0–36.0)
MCV: 98 fL (ref 78.0–100.0)
PLATELETS: 322 10*3/uL (ref 150–400)
RBC: 4.58 MIL/uL (ref 4.22–5.81)
RDW: 13.6 % (ref 11.5–15.5)
WBC: 15.8 10*3/uL — AB (ref 4.0–10.5)

## 2017-11-29 LAB — BASIC METABOLIC PANEL
ANION GAP: 5 (ref 5–15)
BUN: 12 mg/dL (ref 6–20)
CALCIUM: 9.4 mg/dL (ref 8.9–10.3)
CHLORIDE: 105 mmol/L (ref 101–111)
CO2: 30 mmol/L (ref 22–32)
CREATININE: 1.02 mg/dL (ref 0.61–1.24)
GFR calc Af Amer: >60 mL/min (ref >60)
GFR calc non Af Amer: >60 mL/min (ref >60)
Glucose, Bld: 96 mg/dL (ref 65–99)
Potassium: 4.4 mmol/L (ref 3.5–5.1)
SODIUM: 140 mmol/L (ref 135–145)

## 2017-11-29 NOTE — Telephone Encounter (Signed)
Will you please call over to neurologist office and let them know reports getting worse. Somedays reports so weak he has diffuctty standing due to weakness. He wend to [facility] at [facility] at my instruction. He actually was doing better when he finally went next day/rather when I told him to go. But overall getting worse and would like to know if they can somehow see him sooner.

## 2017-11-29 NOTE — ED Provider Notes (Addendum)
[place] EMERGENCY DEPARTMENT Provider Note   CSN: [id number] Arrival date & time: 11/29/17  1062     History   Chief Complaint Chief Complaint  Patient presents with  . Weakness  . Numbness    HPI Bryan Schertzer. is a 57 y.o. male.  57 year old male presents with several month history of worsening whole body weakness with paresthesias.  Has been seen by his physician for the symptoms and had has a work-up including an MRI head CT within the last week.  No known he denies any visual changes.  Occasional troubles with balance.  No bowel or bladder dysfunction.  Symptoms have been in the morning and get gradually worse throughout the day.  Patient recently had been placed on prednisone which he said did help him but as the dose was tapered his symptoms became worse.  He is concerned that he may have multiple sclerosis.  Denies any family history of this.  Called his physician yesterday and spoke to him about his gait problems and was told to come to the ER yesterday.  Came today instead.  No new medications use for this.     Past Medical History:  Diagnosis Date  . Depression   . Hypertension    2 years ago was on med for bp. hctz.    There are no active problems to display for this patient.   Past Surgical History:  Procedure Laterality Date  . SHOULDER SURGERY  2017        Home Medications    Prior to Admission medications   Medication Sig Start Date End Date Taking? Authorizing Provider  predniSONE (DELTASONE) 10 MG tablet 8 tabs po day 1, 7 tab po day 2, 6 tab po day 3, 5 tabs po day 4, 4 day po day 5, 3 tab po day 6, 2 tab po day 7, 1 tab po day 8 11/20/17   Saguier, Percell Miller, PA-C  Vitamin D, Ergocalciferol, (DRISDOL) 50000 units CAPS capsule Take 1 capsule (50,000 Units total) by mouth every 7 (seven) days. 10/24/17   Saguier, Percell Miller, PA-C    Family History No family history on file.  Social History Social History   Tobacco Use  .  Smoking status: Current Every Day Smoker  . Smokeless tobacco: Never Used  Substance Use Topics  . 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  . Drug use: Yes    Types: Marijuana, Cocaine    Comment: In past but not currently(coccaine last year). used marijuana when can.     Allergies   Patient has no allergy information on record.   Review of Systems Review of Systems  All other systems reviewed and are negative.    Physical Exam Updated Vital Signs BP (!) 140/95 (BP Location: Right Arm)   Pulse 73   Temp 97.6 F (36.4 C) (Oral)   Resp 20   SpO2 99%   Physical Exam  Constitutional: He is oriented to person, place, and time. He appears well-developed and well-nourished.  Non-toxic appearance. No distress.  HENT:  Head: Normocephalic and atraumatic.  Eyes: Pupils are equal, round, and reactive to light. Conjunctivae, EOM and lids are normal.  Neck: Normal range of motion. Neck supple. No tracheal deviation present. No thyroid mass present.  Cardiovascular: Normal rate, regular rhythm and normal heart sounds. Exam reveals no gallop.  No murmur heard. Pulmonary/Chest: Effort normal and breath sounds normal. No stridor. No respiratory distress. He has no  decreased breath sounds. He has no wheezes. He has no rhonchi. He has no rales.  Abdominal: Soft. Normal appearance and bowel sounds are normal. He exhibits no distension. There is no tenderness. There is no rebound and no CVA tenderness.  Musculoskeletal: Normal range of motion. He exhibits no edema or tenderness.  Neurological: He is alert and oriented to person, place, and time. He has normal strength. He displays no tremor. No cranial nerve deficit or sensory deficit. Coordination and gait normal. GCS eye subscore is 4. GCS verbal subscore is 5. GCS motor subscore is 6.  Skin: Skin is warm and dry. No abrasion and no rash noted.  Psychiatric: He has a normal mood and affect. His speech is normal and  behavior is normal.  Nursing note and vitals reviewed.    ED Treatments / Results  Labs (all labs ordered are listed, but only abnormal results are displayed) Labs Reviewed  CBC - Abnormal; Notable for the following components:      Result Value   WBC 15.8 (*)    All other components within normal limits  BASIC METABOLIC PANEL  URINALYSIS, ROUTINE W REFLEX MICROSCOPIC    EKG EKG Interpretation  Date/Time:  Thursday November 29 2017 09:44:04 EDT Ventricular Rate:  81 PR Interval:  124 QRS Duration: 104 QT Interval:  386 QTC Calculation: 448 R Axis:   78 Text Interpretation:  Normal sinus rhythm Possible Left atrial enlargement Incomplete right bundle branch block Borderline ECG Confirmed by Lacretia Leigh ([id number]) on 11/29/2017 3:29:23 PM   Radiology No results found.  Procedures Procedures (including critical care time)  Medications Ordered in ED Medications - No data to display   Initial Impression / Assessment and Plan / ED Course  I have reviewed the triage vital signs and the nursing notes.  Pertinent labs & imaging results that were available during my care of the patient were reviewed by me and considered in my medical decision making (see chart for details).     Case discussed with patient's provider.  Patient has appointment with neurology scheduled for July 29 and this will try to be bumped up.  Patient does not have any acute neurological findings here.  No need for further imaging.  Labs are reassuring with exception of a mild leukocytosis.  Final Clinical Impressions(s) / ED Diagnoses   Final diagnoses:  None    ED Discharge Orders    None       Lacretia Leigh, MD 11/29/17 1612    Lacretia Leigh, MD 11/29/17 1620

## 2017-11-29 NOTE — Discharge Instructions (Addendum)
Your doctors office will call you about trying to get your appointment with the neurologist moved up

## 2017-11-29 NOTE — ED Triage Notes (Addendum)
Pt reports generalized weakness, stiff muscles and numbness "all over" he reports these symptoms have been going on since the beginning of the year. Speech clear, face symmetrical. Alert and oriented x4. resp e/u, nad. Reports he was seen at another [facility] recently and was referred to a neurologist but cannot see them until late July.

## 2017-11-30 ENCOUNTER — Telehealth: Payer: Self-pay | Admitting: Medical

## 2017-11-30 NOTE — Telephone Encounter (Signed)
I had asked you to see if he could get in with neurologist sooner.  Jasmine called neurologist office and they told her nothing is available. He is on cancellation list.  Can you call other neurologist office/external to [facility] and see if they can see him sooner in light of circumstance.  Send notes.  I don't want to cancel current referral/appointment. If he were to get earlier appointment with someone else then we could cancel.  Please give me update by Tuesday  July 18,2019.

## 2017-11-30 NOTE — Telephone Encounter (Signed)
Sent message to [facility] to see if she can get him in with other neurologist sooner.

## 2017-11-30 NOTE — Telephone Encounter (Signed)
Pt as appointment 01/14/2018. There are no earlier appointments. Pt is on cancellation list if anyone calls to cancel they will give pt a call.

## 2017-12-04 ENCOUNTER — Telehealth: Payer: Self-pay | Admitting: Medical

## 2017-12-04 NOTE — Telephone Encounter (Signed)
Gwennn,  Any word from other neurologist offices. Pt is expressing he is getting progressivley weaker. Any hopes other offices can get him in sooner?  Mackie Pai, PA-C

## 2017-12-05 ENCOUNTER — Telehealth: Payer: Self-pay | Admitting: Medical

## 2017-12-05 ENCOUNTER — Telehealth: Payer: Self-pay

## 2017-12-05 NOTE — Telephone Encounter (Signed)
Dr. Posey Pronto,  I wanted your opinion and help with this patient.  Referral has already been made and he has appointment at the end of July.  However his condition is worsening.  I saw him initially on Oct 23, 2017 and he had difficulty ambulating/lower leg weakness, upper extremity/paresthesias of fingertips, dizziness and extreme fatigue.  The symptoms occurred while walking [place] the [place] trail for 3 weeks.  His initial work-up of CBC, CMP, rheumatoid factor, C-reactive protein, ANA, tick bite study, sed rate, vitamin D, B12, and B1 were all negative except for low vitamin D.  CT of head without contrast was also negative.  On follow-up November 20, 2017 his signs and symptoms persisted but he had described more pronounced muscle tightness in his body and he had abnormal wide gait on ambulation.  I ordered an MRI of his head and that was also negative.  I discussed case with Dr. Charlett Blake that day and we prescribed taper dose of prednisone.  About a week later I did talk with him and he stated that his muscles felt looser while on prednisone he felt a little bit stronger.  On 11/28/2017 patient sent me a my chart message describing that he felt like he could barely stand up that morning due to severe muscle weakness.  Based on this I advised emergency department evaluation.  He did not go on the 12th due to some obligations and instead went to the emergency department on November 29, 2017.  At that time emergency department physician was not impressed with his physical exam findings and opted not to do any work-up.  When I was notified of this I had our staff call over to your office to see if he could be seen sooner but we were notified he is already on the wait list.  Today he sent me another my chart message stating he is now just able to shuffle around his house and is mostly sedentary.  He mentioned that his upper upper extremities are drawing up.  Sounds like he is describing some contraction of muscles.  He  states that he is dizzy and has poor balance constantly.  Muscles are again feeling tight and he is exhausted.  I know you are schedule openings are limited.  He went to the emergency department at [facility] the other day.  Would you advise that he be seen at [facility] emergency department.  Or maybe [facility] ED?  Depending on how he actually presents at the emergency department, I am thinking he might get neurology consult in the ED.  The problem is think his symptoms are waxing and waning and his presentation at [facility] ED was not impressive and thus did not get any further work-up.  Is there any way you might be able to see him sooner than his appointment at the end of July?  Also wanted to mention that he did report history of prolonged topical permethrin and ivermectin topical use for numerous months proceeding his [employer].  He was treating a diffuse skin rash.  Thanks for your help and sorry for the long message.  Mackie Pai, PA-C

## 2017-12-05 NOTE — Telephone Encounter (Signed)
Routed to front end staff to address.  Copied from [place] [phone]. Topic: Referral - Request >> Dec 03, 2017  8:32 AM Hewitt Shorts wrote: [facility] is looking for a copy of patient insurance card to confirm that they are in network before scheduling his appt   Best number [phone]   fax number [phone]

## 2017-12-05 NOTE — Telephone Encounter (Signed)
Will you let me know which neurologist pt will see with lebeaur?

## 2017-12-07 NOTE — Telephone Encounter (Signed)
My office will contact the patient if there is a sooner appointment.   Donika K. Posey Pronto, DO

## 2017-12-07 NOTE — Telephone Encounter (Signed)
Pt stated did not have an insurance card at all (tricare), but still Rheumatology did get pt scheduled since they got in contact with Referral coordinator.

## 2017-12-12 ENCOUNTER — Encounter: Payer: Self-pay | Admitting: Medical

## 2018-01-02 ENCOUNTER — Telehealth: Payer: Self-pay | Admitting: *Deleted

## 2018-01-02 NOTE — Telephone Encounter (Signed)
Received request for Medical records from [employer], forwarded to [place] for email/scan/SLS 07/17   

## 2018-01-04 ENCOUNTER — Telehealth: Payer: Self-pay | Admitting: *Deleted

## 2018-01-04 NOTE — Telephone Encounter (Signed)
Received Cumulative Lab Report results from [facility] Rheumatology; forwarded to provider/SLS 07/19

## 2018-01-14 ENCOUNTER — Other Ambulatory Visit (INDEPENDENT_AMBULATORY_CARE_PROVIDER_SITE_OTHER)

## 2018-01-14 ENCOUNTER — Encounter

## 2018-01-14 ENCOUNTER — Encounter: Payer: Self-pay | Admitting: Neurology

## 2018-01-14 ENCOUNTER — Ambulatory Visit (INDEPENDENT_AMBULATORY_CARE_PROVIDER_SITE_OTHER): Admitting: Neurology

## 2018-01-14 VITALS — BP 118/80 | HR 76 | Ht 74.0 in | Wt 216.2 lb

## 2018-01-14 DIAGNOSIS — R29898 Other symptoms and signs involving the musculoskeletal system: Secondary | ICD-10-CM

## 2018-01-14 DIAGNOSIS — R261 Paralytic gait: Secondary | ICD-10-CM

## 2018-01-14 DIAGNOSIS — R202 Paresthesia of skin: Secondary | ICD-10-CM

## 2018-01-14 DIAGNOSIS — G909 Disorder of the autonomic nervous system, unspecified: Secondary | ICD-10-CM

## 2018-01-14 DIAGNOSIS — Z131 Encounter for screening for diabetes mellitus: Secondary | ICD-10-CM

## 2018-01-14 DIAGNOSIS — R292 Abnormal reflex: Secondary | ICD-10-CM

## 2018-01-14 DIAGNOSIS — R252 Cramp and spasm: Secondary | ICD-10-CM | POA: Diagnosis not present

## 2018-01-14 DIAGNOSIS — R159 Full incontinence of feces: Secondary | ICD-10-CM

## 2018-01-14 HISTORY — DX: Cramp and spasm: R25.2

## 2018-01-14 HISTORY — DX: Paralytic gait: R26.1

## 2018-01-14 HISTORY — DX: Paresthesia of skin: R20.2

## 2018-01-14 LAB — HEMOGLOBIN A1C: Hgb A1c MFr Bld: 6.1 % (ref 4.6–6.5)

## 2018-01-14 LAB — CK: Total CK: 175 U/L (ref 7–232)

## 2018-01-14 MED ORDER — DIAZEPAM 5 MG PO TABS: ORAL_TABLET | ORAL | 2 refills | Status: DC

## 2018-01-14 MED FILL — diazePAM 5 MG TABS: 5 | 37 days supply | Qty: 90 | Fill #0

## 2018-01-14 NOTE — Progress Notes (Signed)
[place] Neu[facility] Division Clinic Note - Initial Visit   Date: 01/14/18  Bryan Lane. MRN: [id number] DOB: January 12, 1961   Dear Mackie Pai, PA-C:  Thank you for your kind referral of [name]. for consultation of gait difficulty, weakness, and paresthesias. Although his history is well known to you, please allow [name] to reiterate it for the purpose of our medical record. The patient was accompanied to the clinic by self.   History of Present Illness: Bryan Lane. is a 57 y.o. right-handed Caucasian male with hypertension, tobacco use, and depression presenting for evaluation of generalized weakness, imbalance, gait difficulty, and paresthesias.    Patient was in his usual state of health until December 2018 when he developed flu-like symptoms of diffuse myalgias and arthralgias, initially thought it was due to work-related stress and lack of sleep.  He was working as a IT trainer and reports being very high stress environment, so was mentally fatigued.  In January 2019, he began having generalized weakness and mental fatigue, which he attributed to deconditioning and stress, so quit his job and decided to walked to [place] trial in April hoping this would be a break for him and could look for a job after feeling refreshed.  Prior to starting his hike in late March, he began having severe cramping and locking up of the whole body, which lasts several minutes and then slowly eases off.  He also began noticing numbness of the hands and feet.  He went on with his trip and noticed that he got increased fatigued, gait was more difficulty, and he started having falls.  He also recalls a few spells of urinary and bowel incontinence, stating he could not feel the urge to go.  This has continued but now he is very careful and does not wait too long before he gets any urge to use the bathroom, otherwise he will become incontinent.  Due to  extreme exhaustion, cramps, stiffness, and dizziness he stopped his trip after 46 miles and sought medical care.   He was evaluated by his PCP who checked labs for inflammation, Lyme, RMSF, and vitamin deficiency which returned normal.  He was given a trial of prednisone, which provided transient relief until he tapered off it and symptoms flared again. He continues to have numbness of the hands and feet, progressive severe muscle tightness/stiffness, and severe imbalance.  He feels as if he "walks like robot" and if it continues to get worse, he will not be able to walk.  After a severe spell of muscle cramps in the arms, trunk, and legs, his entire body is sore as if he did a strenuous work-out.  No difficulty swallowing or talking. He states that cold temperatures and stress exacerbate his stiffness.   He has lost 30lb over the last 2 years unintentionally.  He has not had colonoscopy for colon cancer screening.  PSA is normal.  He was in the [employer] for 20 years.   In 2017, he was running and felling injuring his left arm. He has left shoulder injury requiring immobilization. Since this time, he had numbness over the left forearm and hand.   He was previously drinking 6 pack about twice per week. He does not drink alcohol since symptom onset, because it makes symptoms worse.   Out-side paper records, electronic medical record, and images have been reviewed where available and summarized as:  MRI brain wo contrast 11/21/2017:  Normal brain MRI.  No acute  intracranial abnormality identified.  Labs 11/20/2017:  ESR 1, CRP, 0.1, ANA neg, RF neg Labs 10/23/2017:  Lyme neg, RMSF neg, vitamin B1 10, vitamin B12 565, TSH 1.99, vitamin D 25*, CBC - nml, CMP nml   Past Medical History:  Diagnosis Date  . Depression   . Hypertension    2 years ago was on med for bp. hctz.    Past Surgical History:  Procedure Laterality Date  . SHOULDER SURGERY Left 2017  . Wisdom teeth removal       Medications:    Outpatient Encounter Medications as of 01/14/2018  Medication Sig  . acetaminophen (TYLENOL) 325 MG tablet Take 650 mg by mouth every 6 (six) hours as needed.  Marland Kitchen ibuprofen (ADVIL,MOTRIN) 200 MG tablet Take 200 mg by mouth every 6 (six) hours as needed.  . naproxen sodium (ALEVE) 220 MG tablet Take 220 mg by mouth.  . diazepam (VALIUM) 5 MG tablet Take 0.5 tablet three times daily x 1 week, then increase to 1 tablet three times daily  . [DISCONTINUED] predniSONE (DELTASONE) 10 MG tablet 8 tabs po day 1, 7 tab po day 2, 6 tab po day 3, 5 tabs po day 4, 4 day po day 5, 3 tab po day 6, 2 tab po day 7, 1 tab po day 8  . [DISCONTINUED] Vitamin D, Ergocalciferol, (DRISDOL) 50000 units CAPS capsule Take 1 capsule (50,000 Units total) by mouth every 7 (seven) days.   No facility-administered encounter medications on file as of 01/14/2018.      Allergies: No Known Allergies  Family History: Family History  Problem Relation Age of Onset  . Diabetes Father   . Heart attack Mother   . Alcoholism Mother   . Hypertension Sister     Social History: Social History   Tobacco Use  . Smoking status: Current Every Day Smoker    Packs/day: 1.00    Years: 40.00    Pack years: 40.00  . Smokeless tobacco: Never Used  Substance Use Topics  . 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  . Drug use: Yes    Types: Marijuana, Cocaine    Comment: In past but not currently(coccaine last year). used marijuana when can.   Social History   Social History Narrative   Lives with dad and stepmother in a one story home.  Has 2 children.     Applying for disability.  Education: BS    Review of Systems:  CONSTITUTIONAL: No fevers, chills, night sweats, +weight loss.   EYES: No visual changes or eye pain ENT: No hearing changes.  No history of nose bleeds.   RESPIRATORY: No cough, wheezing and shortness of breath.   CARDIOVASCULAR: Negative for chest pain, and palpitations.   GI:  Negative for abdominal discomfort, blood in stools or black stools.  No recent change in bowel habits.   GU:  +history of incontinence.   MUSCLOSKELETAL: No history of joint pain or swelling.  +myalgias.   SKIN: Negative for lesions, rash, and itching.   HEMATOLOGY/ONCOLOGY: Negative for prolonged bleeding, bruising easily, and swollen nodes.  No history of cancer.   ENDOCRINE: Negative for cold or heat intolerance, polydipsia or goiter.   PSYCH:  +depression or anxiety symptoms.   NEURO: As Above.   Vital Signs:  BP 118/80   Pulse 76   Ht 6' 2"  (1.88 m)   Wt 216 lb 4 oz (98.1 kg)   SpO2 98%   BMI 27.76  kg/m    General Medical Exam:   General:  Stiff-appearing, comfortable.   Eyes/ENT: see cranial nerve examination.   Neck: No masses appreciated.  Full range of motion without tenderness.  No carotid bruits. Respiratory:  Clear to auscultation, good air entry bilaterally.   Cardiac:  Regular rate and rhythm, no murmur.   Extremities:  Hands are clammy.  There is no edema.  Hands are slightly drawn in.  Skin:  No rashes or lesions.  Neurological Exam: MENTAL STATUS including orientation to time, place, person, recent and remote memory, attention span and concentration, language, and fund of knowledge is normal.  Speech is not dysarthric.  CRANIAL NERVES: II:  No visual field defects.  Unremarkable fundi.   III-IV-VI: Pupils equal round and reactive to light.  Normal conjugate, extra-ocular eye movements in all directions of gaze.  No nystagmus.  No ptosis.   V:  Normal facial sensation.  Jaw jerk is absent.   VII:  Normal facial symmetry and movements.  There is slight increased involuntary spasm of the face when patient is talking. No pathologic facial reflexes.  VIII:  Normal hearing and vestibular function.   IX-X:  Normal palatal movement.   XI:  Normal shoulder shrug and head rotation.   XII:  Normal tongue strength and range of motion, no deviation or  fasciculation.  MOTOR:  No atrophy, fasciculations or abnormal movements.  No pronator drift.  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  Right Upper Extremity:    Left Upper Extremity:    Deltoid  5/5   Deltoid  5/5   Biceps  5/5   Biceps  5/5   Triceps  5/5   Triceps  5/5   Wrist extensors  5/5   Wrist extensors  5/5   Wrist flexors  5/5   Wrist flexors  5/5   Finger extensors  4+/5   Finger extensors  4+/5   Finger flexors  4+/5   Finger flexors  4+/5   Dorsal interossei  4/5   Dorsal interossei  4/5   Abductor pollicis  4/5   Abductor pollicis  4/5   Tone (Ashworth scale)  0  Tone (Ashworth scale)  0   Right Lower Extremity:    Left Lower Extremity:    Hip flexors  5-/5   Hip flexors  5-/5   Hip extensors  5/5   Hip extensors  5/5   Knee flexors  5/5   Knee flexors  5/5   Knee extensors  5/5   Knee extensors  5/5   Dorsiflexors  5/5   Dorsiflexors  5/5   Plantarflexors  5/5   Plantarflexors  5/5   Toe extensors  5/5   Toe extensors  5/5   Toe flexors  5/5   Toe flexors  5/5   Tone (Ashworth scale)  1  Tone (Ashworth scale)  1   MSRs:  Right                                                                 Left brachioradialis 3+  brachioradialis 3+  biceps 3+  biceps 3+  triceps 3+  triceps 3+  patellar 3+  patellar 3+  ankle jerk 2+  ankle jerk 2+  plantar  response mute  plantar response mute   SENSORY:  Pin prick, temperature, and vibration is globally reduced in the arms and legs, and worse distally.  Sensation is intact in the face. Romberg's sign present.   COORDINATION/GAIT: Normal finger-to- nose-finger.  Markedly slowed finger tapping and heel tapping bilaterally.  Unable to rise from a chair without using arms.  Gait is severely spastic with ataxia, unassisted. There is hesitation with gait initiation.   IMPRESSION: Mr. Hank is a delightful 57 year-old man referred with 6 month history of progressive spastic gait, axial and appendicular stiffness, cramps,  and paresthesias.  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  He has severe spastic gait, rigidity, and severe cramps which are exacerbated by cold temperatures and stress.  He has some autonomic dysfunction which can be seen with SPS, however, paresthesias tends to be less common. I will check GAD65 antibody for stiff person syndrome as well as other myelopathy labs as noted below.  Because of his prominent upper motor neuron findings and incontinence, contrasted MRI cervical, thoracic, and lumbar spine will be ordered to assess for structural pathology.  Stiff-person syndrome can also be paraneoplastic manifestation and I will request that his malignancy screening is up-to-date with his PCP.  To evaluate his paresthesias, electrodiagnostic testing will be ordered. He may need CSF testing going forward.   PLAN/RECOMMENDATIONS:  1.  Check CK, aldolase, MMA, copper, zinc, SPEP with IFE, GAD antibody, HbA1c 2.  MRI cervical, thoracic spine, and lumbar spine with and without contrast 3.  NCS/EMG of the right arm and leg 4.  Start diazepam 2.60m three times daily x 1 week, then increase to 516mTID to control muscle cramps/spasms 5.  Request PCP to bring his malignancy screening up-to-date, such as colonoscopy 6.  Fall precautions discussed, strongly urged him to use walking sticks for balance and stability  Further recommendations pending results  Thank you for allowing me to participate in patient's care.  If I can answer any additional questions, I would be pleased to do so.    Sincerely,    Donika K. PaPosey ProntoDO

## 2018-01-14 NOTE — Patient Instructions (Addendum)
MRI cervical, thoracic, lumbar wwo spine   Check labs  NCS/EMG of the right arm and leg  Please start using hiking sticks to assist with your balance when walking

## 2018-01-15 ENCOUNTER — Telehealth: Payer: Self-pay | Admitting: Medical

## 2018-01-15 DIAGNOSIS — Z122 Encounter for screening for malignant neoplasm of respiratory organs: Secondary | ICD-10-CM

## 2018-01-15 DIAGNOSIS — Z1211 Encounter for screening for malignant neoplasm of colon: Secondary | ICD-10-CM

## 2018-01-15 NOTE — Telephone Encounter (Signed)
Referral to GI for colonoscopy.

## 2018-01-15 NOTE — Telephone Encounter (Signed)
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 If needed you could fax neurologist last note. This would help explain why bringing appropiate cancer screening up to date.

## 2018-01-15 NOTE — Telephone Encounter (Signed)
Would you call pt and ask him to follow up in 2 weeks. I reviewed neurologist note and want to him to follow up with me.

## 2018-01-16 ENCOUNTER — Encounter: Payer: Self-pay | Admitting: Gastroenterology

## 2018-01-16 NOTE — Telephone Encounter (Signed)
Pt already schedule an appt on 01-29-2018. Done

## 2018-01-17 ENCOUNTER — Ambulatory Visit (INDEPENDENT_AMBULATORY_CARE_PROVIDER_SITE_OTHER): Admitting: Neurology

## 2018-01-17 DIAGNOSIS — R29898 Other symptoms and signs involving the musculoskeletal system: Secondary | ICD-10-CM

## 2018-01-17 DIAGNOSIS — R261 Paralytic gait: Secondary | ICD-10-CM

## 2018-01-17 DIAGNOSIS — R32 Unspecified urinary incontinence: Secondary | ICD-10-CM

## 2018-01-17 DIAGNOSIS — R159 Full incontinence of feces: Secondary | ICD-10-CM

## 2018-01-17 DIAGNOSIS — R292 Abnormal reflex: Secondary | ICD-10-CM | POA: Diagnosis not present

## 2018-01-17 DIAGNOSIS — R202 Paresthesia of skin: Secondary | ICD-10-CM

## 2018-01-17 DIAGNOSIS — G909 Disorder of the autonomic nervous system, unspecified: Secondary | ICD-10-CM

## 2018-01-17 DIAGNOSIS — R252 Cramp and spasm: Secondary | ICD-10-CM

## 2018-01-17 HISTORY — DX: Unspecified urinary incontinence: R32

## 2018-01-17 LAB — COPPER, SERUM: Copper: 73 ug/dL (ref 70–175)

## 2018-01-17 LAB — PROTEIN ELECTROPHORESIS, SERUM
ALPHA 2: 0.6 g/dL (ref 0.5–0.9)
Albumin ELP: 4.8 g/dL (ref 3.8–4.8)
Alpha 1: 0.2 g/dL (ref 0.2–0.3)
BETA GLOBULIN: 0.4 g/dL (ref 0.4–0.6)
Beta 2: 0.2 g/dL (ref 0.2–0.5)
GAMMA GLOBULIN: 0.8 g/dL (ref 0.8–1.7)
Total Protein: 6.9 g/dL (ref 6.1–8.1)

## 2018-01-17 LAB — IMMUNOFIXATION ELECTROPHORESIS
IGM, SERUM: 118 mg/dL (ref 50–300)
IgG (Immunoglobin G), Serum: 835 mg/dL (ref 600–1640)
Immunofix Electr Int: NOT DETECTED
Immunoglobulin A: 101 mg/dL (ref 47–310)

## 2018-01-17 LAB — METHYLMALONIC ACID, SERUM: Methylmalonic Acid, Quant: 166 nmol/L (ref 87–318)

## 2018-01-17 LAB — ALDOLASE: Aldolase: 5.1 U/L (ref <=8.1)

## 2018-01-17 LAB — GLUTAMIC ACID DECARBOXYLASE AUTO ABS

## 2018-01-17 LAB — ZINC: Zinc: 81 ug/dL (ref 60–130)

## 2018-01-17 NOTE — Procedures (Signed)
[facility]  [address], [address]  [place], [place] [address] Tel: [phone] Fax:  [phone] Test Date:  01/17/2018  Patient: Bryan Lane, Bryan Lane. Copper DOB: 1960-12-26 Physician: Narda Amber, DO  Sex: Male Height: 6\' 2"  Ref Phys: Narda Amber, DO  ID#: [id number] Temp: 33.1C Technician:    Patient Complaints: This is a 57 year old man referred for evaluation of generalized rigidity, cramps, and spastic gait.  NCV & EMG Findings: Extensive electrodiagnostic testing of the right upper, midthoracic paraspinal muscles, and lower extremity shows:  1. Right median, ulnar, sural, and superficial peroneal nerves are within normal limits. 2. Right median motor responses within normal limits. Right ulnar motor response shows slowed conduction velocity across the elbow (A Elbow-B Elbow, 42 m/s) with normal latency and amplitude.   3. Right peroneal motor response at the extensor digitorum brevis is reduced, and normal at the tibialis anterior. Right tibial motor responses within normal limits. 4. Right tibial H reflex study is within normal limits when adjusted for patient's height. 5. Chronic motor axon loss changes are seen affecting the ulnar innervated muscle as well as active on chronic changes involving the right C5-C6 myotomes. 6. There is no evidence of active or chronic motor axon loss changes in the mid thoracic paraspinal muscles or tested muscles of the right lower extremity. No cramp potentials are seen.  Impression: 1. Subacute C5-C6 intraspinal canal lesion (i.e. radiculopathy) affecting the right upper extremity, moderate-to-severe in degree electrically. 2. Right ulnar neuropathy with slowing across the elbow, purely demyelinating in type, moderate in degree electrically. 3. There is no evidence of sensory motor polyneuropathy or lumbosacral radiculopathy.   ___________________________ Narda Amber, DO    Nerve Conduction Studies Anti Sensory Summary Table   Site NR Peak (ms) Norm Peak (ms) P-T Amp (V) Norm P-T Amp  Right Median Anti Sensory (2nd Digit)  33.1C  Wrist    3.4 <3.6 18.3 >15  Right Sup Peroneal Anti Sensory (Ant Lat Mall)  33.1C  12 cm    2.7 <4.6 7.7 >4  Right Sural Anti Sensory (Lat Mall)  33.1C  Calf    3.1 <4.6 11.8 >4  Right Ulnar Anti Sensory (5th Digit)  33.1C  Wrist    3.1 <3.1 15.0 >10   Motor Summary Table   Site NR Onset (ms) Norm Onset (ms) O-P Amp (mV) Norm O-P Amp Site1 Site2 Delta-0 (ms) Dist (cm) Vel (m/s) Norm Vel (m/s)  Right Median Motor (Abd Poll Brev)  33.1C  Wrist    3.5 <4.0 9.2 >6 Elbow Wrist 5.8 29.0 50 >50  Elbow    9.3  9.1         Right Peroneal Motor (Ext Dig Brev)  33.1C  Ankle    4.1 <6.0 1.3 >2.5 B Fib Ankle 8.9 44.0 49 >40  B Fib    13.0  0.6  Poplt B Fib 2.0 10.0 50 >40  Poplt    15.0  0.6         Right Peroneal TA Motor (Tib Ant)  33.1C  Fib Head    2.0 <4.5 5.2 >3 Poplit Fib Head 2.1 10.0 48 >40  Poplit    4.1  4.8         Right Tibial Motor (Abd Hall Brev)  33.1C  Ankle    3.8 <6.0 9.0 >4 Knee Ankle 10.3 43.0 42 >40  Knee    14.1  7.9         Right Ulnar Motor (Abd Dig  Minimi)  33.1C  Wrist    2.7 <3.1 9.9 >7 B Elbow Wrist 4.5 26.0 58 >50  B Elbow    7.2  8.8  A Elbow B Elbow 2.4 10.0 42 >50  A Elbow    9.6  8.3          Comparison Summary Table   Site NR Peak (ms) Norm Peak (ms) P-T Amp (V) Site1 Site2 Delta-P (ms) Norm Delta (ms)  Right Median/Ulnar Palm Comparison (Wrist - 8cm)  33.1C  Median Palm    2.2 <2.2 45.5 Median Palm Ulnar Palm 0.4   Ulnar Palm    1.8 <2.2 8.5       H Reflex Studies   NR H-Lat (ms) Lat Norm (ms) L-R H-Lat (ms)  Right Tibial (Gastroc)  33.1C     38.10 <35    EMG   Side Muscle Ins Act Fibs Psw Fasc Number Recrt Dur Dur. Amp Amp. Poly Poly. Comment  Right 1stDorInt Nml Nml Nml Nml 1- Rapid Some 1+ Some 1+ Few 1+ N/A  Right ABD Dig Min Nml Nml Nml Nml 2- Rapid Many 1+ Many 1+ Some 1+ N/A  Right FlexCarpiUln Nml Nml Nml Nml 1- Rapid  Some 1+ Some 1+ Nml Nml N/A  Right Ext Indicis Nml Nml Nml Nml Nml Nml Nml Nml Nml Nml Nml Nml N/A  Right PronatorTeres 1+ Nml Nml Nml 1- Rapid Some 1+ Some 1+ Nml Nml N/A  Right Biceps 1+ Nml Nml Nml 2- Rapid Many 1+ Many 1+ Some 1+ N/A  Right Triceps Nml Nml Nml Nml Nml Nml Nml Nml Nml Nml Nml Nml N/A  Right Deltoid 1+ Nml Nml Nml 3- Rapid Most 1+ Most 1+ Most 1+ N/A  Right Cervical Parasp Low Nml Nml Nml Nml Nml Nml Nml Nml Nml Nml Nml Nml N/A  Right T7 Parasp Nml Nml Nml Nml Nml Nml Nml Nml Nml Nml Nml Nml N/A  Right T11 Parasp Nml Nml Nml Nml Nml Nml Nml Nml Nml Nml Nml Nml N/A  Right AntTibialis Nml Nml Nml Nml Nml Nml Nml Nml Nml Nml Nml Nml N/A  Right Gastroc Nml Nml Nml Nml Nml Nml Nml Nml Nml Nml Nml Nml N/A  Right Flex Dig Long Nml Nml Nml Nml Nml Nml Nml Nml Nml Nml Nml Nml N/A  Right RectFemoris Nml Nml Nml Nml Nml Nml Nml Nml Nml Nml Nml Nml N/A  Right GluteusMed Nml Nml Nml Nml Nml Nml Nml Nml Nml Nml Nml Nml N/A  Right Lumbo Parasp Low Nml Nml Nml Nml Nml Nml Nml Nml Nml Nml Nml Nml N/A      Waveforms:

## 2018-01-19 ENCOUNTER — Encounter (HOSPITAL_BASED_OUTPATIENT_CLINIC_OR_DEPARTMENT_OTHER): Payer: Self-pay

## 2018-01-19 ENCOUNTER — Ambulatory Visit (HOSPITAL_BASED_OUTPATIENT_CLINIC_OR_DEPARTMENT_OTHER)
Admission: RE | Admit: 2018-01-19 | Discharge: 2018-01-19 | Disposition: A | Discharge status: Home / Self Care | Source: Ambulatory Visit | Attending: Medical | Admitting: Medical

## 2018-01-19 ENCOUNTER — Ambulatory Visit (HOSPITAL_BASED_OUTPATIENT_CLINIC_OR_DEPARTMENT_OTHER)
Admission: RE | Admit: 2018-01-19 | Discharge: 2018-01-19 | Disposition: A | Discharge status: Home / Self Care | Source: Ambulatory Visit | Attending: Neurology | Admitting: Neurology

## 2018-01-19 DIAGNOSIS — R252 Cramp and spasm: Secondary | ICD-10-CM | POA: Diagnosis not present

## 2018-01-19 DIAGNOSIS — M48061 Spinal stenosis, lumbar region without neurogenic claudication: Secondary | ICD-10-CM | POA: Insufficient documentation

## 2018-01-19 DIAGNOSIS — R29898 Other symptoms and signs involving the musculoskeletal system: Secondary | ICD-10-CM

## 2018-01-19 DIAGNOSIS — J432 Centrilobular emphysema: Secondary | ICD-10-CM | POA: Diagnosis not present

## 2018-01-19 DIAGNOSIS — Z122 Encounter for screening for malignant neoplasm of respiratory organs: Secondary | ICD-10-CM | POA: Insufficient documentation

## 2018-01-19 DIAGNOSIS — M5030 Other cervical disc degeneration, unspecified cervical region: Secondary | ICD-10-CM | POA: Insufficient documentation

## 2018-01-19 DIAGNOSIS — R261 Paralytic gait: Secondary | ICD-10-CM | POA: Insufficient documentation

## 2018-01-19 DIAGNOSIS — R202 Paresthesia of skin: Secondary | ICD-10-CM | POA: Diagnosis not present

## 2018-01-19 DIAGNOSIS — R159 Full incontinence of feces: Secondary | ICD-10-CM | POA: Insufficient documentation

## 2018-01-19 DIAGNOSIS — I700 Atherosclerosis of aorta: Secondary | ICD-10-CM | POA: Diagnosis not present

## 2018-01-19 DIAGNOSIS — R292 Abnormal reflex: Secondary | ICD-10-CM

## 2018-01-19 DIAGNOSIS — Z87891 Personal history of nicotine dependence: Secondary | ICD-10-CM | POA: Insufficient documentation

## 2018-01-19 DIAGNOSIS — G909 Disorder of the autonomic nervous system, unspecified: Secondary | ICD-10-CM | POA: Diagnosis not present

## 2018-01-19 DIAGNOSIS — M4317 Spondylolisthesis, lumbosacral region: Secondary | ICD-10-CM | POA: Insufficient documentation

## 2018-01-19 DIAGNOSIS — M5127 Other intervertebral disc displacement, lumbosacral region: Secondary | ICD-10-CM | POA: Diagnosis not present

## 2018-01-19 DIAGNOSIS — M7138 Other bursal cyst, other site: Secondary | ICD-10-CM | POA: Insufficient documentation

## 2018-01-19 DIAGNOSIS — M47814 Spondylosis without myelopathy or radiculopathy, thoracic region: Secondary | ICD-10-CM | POA: Diagnosis not present

## 2018-01-19 DIAGNOSIS — M4802 Spinal stenosis, cervical region: Secondary | ICD-10-CM | POA: Insufficient documentation

## 2018-01-19 MED ORDER — GADOBENATE DIMEGLUMINE 529 MG/ML IV SOLN
20.0000 mL | Freq: Once | INTRAVENOUS | Status: AC | PRN
  Administered 2018-01-19: 20 mL via INTRAVENOUS

## 2018-01-21 ENCOUNTER — Telehealth: Payer: Self-pay | Admitting: Neurology

## 2018-01-21 NOTE — Telephone Encounter (Signed)
Called patient and discussed results of imaging, NCS/EMG, and labs.  His MRI cervical spine shows severe spinal stenosis at C3-4 and C4-5 with cord compression, as well as multilevel spinal stenosis, which explains his progressive neurological deficits.  Patient's NCS/EMG also shows subacute right C5-6 radiculopathy, which is severe.  Myelopathy labs, including GAD antibody testing for Stiffperson Syndrome is negative.    Recommend emergent evaluation by neurosurgery.  He was informed to go to the ER due to his progressive gait instability, weakness, paresthesia, and incontinence.  Patient is requesting urgent out-patient appointment as symptoms have been ongoing since March 2019.  He is at high risk of neurological disability.  Warning signs discussed for worsening cord compression to seek medical care emergently.    Elizeo Rodriques K. Posey Pronto, DO

## 2018-01-21 NOTE — Telephone Encounter (Signed)
Urgent referral faxed to [facility].

## 2018-01-22 ENCOUNTER — Telehealth: Payer: Self-pay | Admitting: Medical

## 2018-01-22 NOTE — Telephone Encounter (Signed)
Caryl Pina,  I was looking at message and saw  Dr. Posey Pronto message sent to me stated emergent evaluation by neurosurgeon. So was wandering what the status of that referral was?   Thanks, Mackie Pai, PA-C

## 2018-01-22 NOTE — Telephone Encounter (Signed)
I am not in this week but was looking at labs etc tonight on 01-22-2018. Neurologist found emergent findings on imaging of neck and recommend he go to ED and referred him to [place] neuro[facility]. Dr Posey Pronto is his neurologist and Su Hoff is the LPN who put in the referral request to neurosurgery. Will you call patient or specialist office to  find out if he went to the ED and/or does he have appointment scheduled with neurosurgery?  I tried to call patient late last night but got no answer.

## 2018-01-23 ENCOUNTER — Other Ambulatory Visit: Payer: Self-pay | Admitting: Neurosurgery

## 2018-01-23 ENCOUNTER — Telehealth: Payer: Self-pay | Admitting: Medical

## 2018-01-23 NOTE — Telephone Encounter (Signed)
Author phoned pt. to f/u neurosurgery appointment per [facility] request. Pt. has an appt. with Percell Miller 8/13. Author left detailed VM asking for call back [phone].

## 2018-01-23 NOTE — Telephone Encounter (Signed)
Good morning.  The patient was seen yesterday by [facility].

## 2018-01-23 NOTE — Telephone Encounter (Signed)
I did talk with patient and he did see neurosurgeon yesterday. They are working on getting him scheduled for surgery. LPN at neurologist update me that he did get in with neurosurgeon yesterday.

## 2018-01-23 NOTE — Telephone Encounter (Signed)
Thanks for getting him in so quickly.

## 2018-01-25 NOTE — Pre-Procedure Instructions (Addendum)
Bryan Lane.  01/25/2018    Your procedure is scheduled on Thursday, August 15.  Report to [facility] Admitting at  10:00 AM                   Your surgery or procedure is scheduled for 12:00 noon   Call this number if you have problems the morning of surgery: [phone]  This is the number for the Pre- Surgical Desk.           For any other questions prior to surgery Monday - Friday, 8:00 AM - 4:00 PM, call [phone]- PAT desk, ask to speak to any nurse.   Remember:  Do not eat or drink after midnight Wednesday, August 14.    Take these medicines the morning of surgery with A SIP OF WATER: acetaminophen (TYLENOL) Valium   1 Week prior to surgery STOP taking Aspirin, Aspirin Products (Goody Powder, Excedrin Migraine), Ibuprofen (Advil), Naproxen (Aleve), Vitamins and Herbal Products (ie Fish Oil) Special instructions:    [place]- Preparing For Surgery  Before surgery, you can play an important role. Because skin is not sterile, your skin needs to be as free of germs as possible. You can reduce the number of germs on your skin by washing with CHG (chlorahexidine gluconate) Soap before surgery.  CHG is an antiseptic cleaner which kills germs and bonds with the skin to continue killing germs even after washing.    Oral Hygiene is also important to reduce your risk of infection.  Remember - BRUSH YOUR TEETH THE MORNING OF SURGERY WITH YOUR REGULAR TOOTHPASTE  Please do not use if you have an allergy to CHG or antibacterial soaps. If your skin becomes reddened/irritated stop using the CHG.  Do not shave (including legs and underarms) for at least 48 hours prior to first CHG shower. It is OK to shave your face.  Please follow these instructions carefully.   1. Shower the NIGHT BEFORE SURGERY and the MORNING OF SURGERY with CHG.   2. If you chose to wash your hair, wash your hair first as usual with your normal shampoo.  3. After you shampoo, rinse your hair  and body thoroughly to remove the shampoo.         Wash your face and private area with the soap you use at home, then rinse.  4. Use CHG as you would any other liquid soap. You can apply CHG directly to the skin and wash gently with a scrungie or a clean washcloth.   5. Apply the CHG Soap to your body ONLY FROM THE NECK DOWN.  Do not use on open wounds or open sores. Avoid contact with your eyes, ears, mouth and genitals (private parts).   6. Wash thoroughly, paying special attention to the area where your surgery will be performed.  7. Thoroughly rinse your body with warm water from the neck down.  8. DO NOT shower/wash with your normal soap after using and rinsing off the CHG Soap.  9. Pat yourself dry with a CLEAN TOWEL.  10. Wear CLEAN PAJAMAS to bed the night before surgery, wear comfortable clothes the morning of surgery  11. Place CLEAN SHEETS on your bed the night of your first shower and DO NOT SLEEP WITH PETS.  Day of Surgery: Shower as Above Do not apply any deodorants/lotions, powder and cologne..  Please wear clean clothes to the hospital/surgery center.   Remember to brush your teeth WITH YOUR REGULAR  TOOTHPASTE.  Do not wear jewelry, make-up or nail polish.             Men may shave face and neck.  Do not bring valuables to the hospital.  [facility] is not responsible for any belongings or valuables.  Contacts, dentures or bridgework may not be worn into surgery.  Leave your suitcase in the car.  After surgery it may be brought to your room.  For patients admitted to the hospital, discharge time will be determined by your treatment team.  Patients discharged the day of surgery will not be allowed to drive home.   Please read over the following fact sheets that you were given: Pain Booklet, Patient Instructions for Mupirocin Application, Coughing and DeepBreathing, Surgical Site Infections.

## 2018-01-28 ENCOUNTER — Encounter (HOSPITAL_COMMUNITY): Payer: Self-pay

## 2018-01-28 ENCOUNTER — Encounter (HOSPITAL_COMMUNITY): Payer: Self-pay | Admitting: *Deleted

## 2018-01-28 ENCOUNTER — Other Ambulatory Visit: Payer: Self-pay

## 2018-01-28 ENCOUNTER — Encounter (HOSPITAL_COMMUNITY)
Admission: RE | Admit: 2018-01-28 | Discharge: 2018-01-28 | Disposition: A | Discharge status: Home / Self Care | Source: Ambulatory Visit | Attending: Neurosurgery | Admitting: Neurosurgery

## 2018-01-28 HISTORY — DX: Post-traumatic stress disorder, unspecified: F43.10

## 2018-01-28 HISTORY — DX: Unspecified urinary incontinence: R32

## 2018-01-28 HISTORY — DX: Full incontinence of feces: R15.9

## 2018-01-28 HISTORY — DX: Dyspnea, unspecified: R06.00

## 2018-01-28 LAB — CBC
HEMATOCRIT: 40.9 % (ref 39.0–52.0)
HEMOGLOBIN: 13.5 g/dL (ref 13.0–17.0)
MCH: 31.2 pg (ref 26.0–34.0)
MCHC: 33 g/dL (ref 30.0–36.0)
MCV: 94.5 fL (ref 78.0–100.0)
Platelets: 309 10*3/uL (ref 150–400)
RBC: 4.33 MIL/uL (ref 4.22–5.81)
RDW: 13.2 % (ref 11.5–15.5)
WBC: 11.4 10*3/uL — AB (ref 4.0–10.5)

## 2018-01-28 LAB — TYPE AND SCREEN
ABO/RH(D): O POS
ANTIBODY SCREEN: NEGATIVE

## 2018-01-28 LAB — BASIC METABOLIC PANEL
ANION GAP: 6 (ref 5–15)
BUN: 14 mg/dL (ref 6–20)
CALCIUM: 9.7 mg/dL (ref 8.9–10.3)
CO2: 28 mmol/L (ref 22–32)
Chloride: 107 mmol/L (ref 98–111)
Creatinine, Ser: 1.16 mg/dL (ref 0.61–1.24)
Glucose, Bld: 93 mg/dL (ref 70–99)
POTASSIUM: 4.4 mmol/L (ref 3.5–5.1)
Sodium: 141 mmol/L (ref 135–145)

## 2018-01-28 LAB — ABO/RH: ABO/RH(D): O POS

## 2018-01-28 LAB — SURGICAL PCR SCREEN
MRSA, PCR: NEGATIVE
STAPHYLOCOCCUS AUREUS: NEGATIVE

## 2018-01-28 NOTE — Progress Notes (Signed)
Bryan Lane denies chest pain, no shortness of breath at rest.   Patient's Blood pressure was 118/92. Bryan Lane reports that he was on HCTZ in the past, but has been off a while.  Bryan Lane reports that he is very anxious about surgery. Recheck of blood pressure was 127/89.

## 2018-01-29 ENCOUNTER — Ambulatory Visit: Admitting: Medical

## 2018-01-29 ENCOUNTER — Telehealth: Payer: Self-pay | Admitting: Medical

## 2018-01-30 ENCOUNTER — Other Ambulatory Visit: Payer: Self-pay | Admitting: Neurosurgery

## 2018-01-30 ENCOUNTER — Telehealth: Payer: Self-pay | Admitting: Medical

## 2018-01-30 NOTE — Telephone Encounter (Signed)
Called pt to see how he was since I was running lat yesterday and he had to leave. Later  saw he was anxious when he talked to RN. Initially could not hear what he said so called him back on land line. He indicated he was not too anxious today. I apologized for running late yesterday. Asked him to follow up with me 7-10 days after surgery and to schedule 1pm appointment so he would not have to wait/be first patient.

## 2018-01-31 ENCOUNTER — Inpatient Hospital Stay (HOSPITAL_COMMUNITY): Admitting: Certified Registered Nurse Anesthetist

## 2018-01-31 ENCOUNTER — Inpatient Hospital Stay (HOSPITAL_COMMUNITY)
Admission: RE | Admit: 2018-01-31 | Discharge: 2018-02-01 | DRG: 472 | Disposition: A | Discharge status: Home / Self Care | Attending: Neurosurgery | Admitting: Neurosurgery

## 2018-01-31 ENCOUNTER — Inpatient Hospital Stay (HOSPITAL_COMMUNITY)

## 2018-01-31 ENCOUNTER — Inpatient Hospital Stay (HOSPITAL_COMMUNITY)
Admission: RE | Disposition: A | Discharge status: Home / Self Care | Payer: Self-pay | Source: Home / Self Care | Attending: Neurosurgery

## 2018-01-31 ENCOUNTER — Other Ambulatory Visit: Payer: Self-pay

## 2018-01-31 ENCOUNTER — Encounter (HOSPITAL_COMMUNITY): Payer: Self-pay

## 2018-01-31 DIAGNOSIS — M479 Spondylosis, unspecified: Secondary | ICD-10-CM | POA: Diagnosis present

## 2018-01-31 DIAGNOSIS — Z419 Encounter for procedure for purposes other than remedying health state, unspecified: Secondary | ICD-10-CM

## 2018-01-31 DIAGNOSIS — R159 Full incontinence of feces: Secondary | ICD-10-CM | POA: Diagnosis present

## 2018-01-31 DIAGNOSIS — G992 Myelopathy in diseases classified elsewhere: Secondary | ICD-10-CM | POA: Diagnosis present

## 2018-01-31 DIAGNOSIS — M4802 Spinal stenosis, cervical region: Principal | ICD-10-CM | POA: Diagnosis present

## 2018-01-31 DIAGNOSIS — F17210 Nicotine dependence, cigarettes, uncomplicated: Secondary | ICD-10-CM | POA: Diagnosis present

## 2018-01-31 DIAGNOSIS — Z791 Long term (current) use of non-steroidal anti-inflammatories (NSAID): Secondary | ICD-10-CM

## 2018-01-31 DIAGNOSIS — F418 Other specified anxiety disorders: Secondary | ICD-10-CM | POA: Diagnosis present

## 2018-01-31 DIAGNOSIS — Z79899 Other long term (current) drug therapy: Secondary | ICD-10-CM | POA: Diagnosis not present

## 2018-01-31 DIAGNOSIS — R32 Unspecified urinary incontinence: Secondary | ICD-10-CM | POA: Diagnosis present

## 2018-01-31 DIAGNOSIS — I10 Essential (primary) hypertension: Secondary | ICD-10-CM | POA: Diagnosis present

## 2018-01-31 DIAGNOSIS — F4310 Post-traumatic stress disorder, unspecified: Secondary | ICD-10-CM | POA: Diagnosis present

## 2018-01-31 DIAGNOSIS — R400 Somnolence: Secondary | ICD-10-CM | POA: Diagnosis present

## 2018-01-31 HISTORY — DX: Myelopathy in diseases classified elsewhere: G99.2

## 2018-01-31 HISTORY — PX: ANTERIOR CERVICAL DECOMPRESSION/DISCECTOMY FUSION 4 LEVELS: SHX5556

## 2018-01-31 SURGERY — ANTERIOR CERVICAL DECOMPRESSION/DISCECTOMY FUSION 4 LEVELS
Anesthesia: General | Site: Spine Cervical

## 2018-01-31 MED ORDER — BACITRACIN ZINC 500 UNIT/GM EX OINT
TOPICAL_OINTMENT | CUTANEOUS | Status: DC | PRN
  Administered 2018-01-31: 1 via TOPICAL

## 2018-01-31 MED ORDER — CEFAZOLIN SODIUM-DEXTROSE 2-4 GM/100ML-% IV SOLN
INTRAVENOUS | Status: AC
  Filled 2018-01-31: qty 100

## 2018-01-31 MED ORDER — CHLORHEXIDINE GLUCONATE CLOTH 2 % EX PADS: 6.0000 | MEDICATED_PAD | Freq: Once | CUTANEOUS | Status: DC

## 2018-01-31 MED ORDER — HYDROMORPHONE HCL 1 MG/ML IJ SOLN
INTRAMUSCULAR | Status: AC
  Filled 2018-01-31: qty 1

## 2018-01-31 MED ORDER — MEPERIDINE HCL 50 MG/ML IJ SOLN: 6.2500 mg | INTRAMUSCULAR | Status: DC | PRN

## 2018-01-31 MED ORDER — PHENOL 1.4 % MT LIQD: 1.0000 | OROMUCOSAL | Status: DC | PRN

## 2018-01-31 MED ORDER — LACTATED RINGERS IV SOLN
INTRAVENOUS | Status: DC
  Administered 2018-01-31: 19:00:00 via INTRAVENOUS

## 2018-01-31 MED ORDER — DOCUSATE SODIUM 100 MG PO CAPS
100.0000 mg | ORAL_CAPSULE | Freq: Two times a day (BID) | ORAL | Status: DC
  Administered 2018-01-31 – 2018-02-01 (×2): 100 mg via ORAL
  Filled 2018-01-31 (×2): qty 1

## 2018-01-31 MED ORDER — ALUM & MAG HYDROXIDE-SIMETH 200-200-20 MG/5ML PO SUSP: 30.0000 mL | Freq: Four times a day (QID) | ORAL | Status: DC | PRN

## 2018-01-31 MED ORDER — CEFAZOLIN SODIUM-DEXTROSE 1-4 GM/50ML-% IV SOLN
INTRAVENOUS | Status: DC | PRN
  Administered 2018-01-31: 2 g via INTRAVENOUS

## 2018-01-31 MED ORDER — PANTOPRAZOLE SODIUM 20 MG PO TBEC
20.0000 mg | DELAYED_RELEASE_TABLET | Freq: Two times a day (BID) | ORAL | Status: DC
  Administered 2018-01-31 – 2018-02-01 (×2): 20 mg via ORAL
  Filled 2018-01-31 (×2): qty 1

## 2018-01-31 MED ORDER — SODIUM CHLORIDE 0.9 % IV SOLN
INTRAVENOUS | Status: DC | PRN
  Administered 2018-01-31: 500 mL

## 2018-01-31 MED ORDER — ACETAMINOPHEN 325 MG PO TABS: 650.0000 mg | ORAL_TABLET | ORAL | Status: DC | PRN

## 2018-01-31 MED ORDER — ACETAMINOPHEN 500 MG PO TABS
1000.0000 mg | ORAL_TABLET | Freq: Four times a day (QID) | ORAL | Status: AC
  Administered 2018-01-31 – 2018-02-01 (×4): 1000 mg via ORAL
  Filled 2018-01-31 (×4): qty 2

## 2018-01-31 MED ORDER — FENTANYL CITRATE (PF) 250 MCG/5ML IJ SOLN
INTRAMUSCULAR | Status: AC
  Filled 2018-01-31: qty 5

## 2018-01-31 MED ORDER — CEFAZOLIN SODIUM-DEXTROSE 2-4 GM/100ML-% IV SOLN
2.0000 g | Freq: Three times a day (TID) | INTRAVENOUS | Status: AC
  Administered 2018-01-31 – 2018-02-01 (×2): 2 g via INTRAVENOUS
  Filled 2018-01-31 (×2): qty 100

## 2018-01-31 MED ORDER — 0.9 % SODIUM CHLORIDE (POUR BTL) OPTIME
TOPICAL | Status: DC | PRN
  Administered 2018-01-31: 1000 mL

## 2018-01-31 MED ORDER — PROPOFOL 10 MG/ML IV BOLUS
INTRAVENOUS | Status: DC | PRN
  Administered 2018-01-31: 160 mg via INTRAVENOUS

## 2018-01-31 MED ORDER — CYCLOBENZAPRINE HCL 10 MG PO TABS
10.0000 mg | ORAL_TABLET | Freq: Three times a day (TID) | ORAL | Status: DC | PRN
  Administered 2018-01-31 – 2018-02-01 (×2): 10 mg via ORAL
  Filled 2018-01-31 (×2): qty 1

## 2018-01-31 MED ORDER — PROMETHAZINE HCL 25 MG/ML IJ SOLN: 6.2500 mg | INTRAMUSCULAR | Status: DC | PRN

## 2018-01-31 MED ORDER — THROMBIN (RECOMBINANT) 20000 UNITS EX SOLR
CUTANEOUS | Status: AC
  Filled 2018-01-31: qty 20000

## 2018-01-31 MED ORDER — BISACODYL 10 MG RE SUPP: 10.0000 mg | Freq: Every day | RECTAL | Status: DC | PRN

## 2018-01-31 MED ORDER — ROCURONIUM BROMIDE 10 MG/ML (PF) SYRINGE
PREFILLED_SYRINGE | INTRAVENOUS | Status: DC | PRN
  Administered 2018-01-31: 50 mg via INTRAVENOUS

## 2018-01-31 MED ORDER — THROMBIN 20000 UNITS EX SOLR
CUTANEOUS | Status: DC | PRN
  Administered 2018-01-31: 20000 [IU] via TOPICAL

## 2018-01-31 MED ORDER — DIAZEPAM 5 MG PO TABS
5.0000 mg | ORAL_TABLET | Freq: Three times a day (TID) | ORAL | Status: DC
  Administered 2018-01-31 – 2018-02-01 (×3): 5 mg via ORAL
  Filled 2018-01-31 (×2): qty 1

## 2018-01-31 MED ORDER — LIDOCAINE 2% (20 MG/ML) 5 ML SYRINGE
INTRAMUSCULAR | Status: AC
  Filled 2018-01-31: qty 5

## 2018-01-31 MED ORDER — SODIUM CHLORIDE 0.9 % IV SOLN
INTRAVENOUS | Status: DC | PRN
  Administered 2018-01-31: 14:00:00 via INTRAVENOUS
  Administered 2018-01-31: 25 ug/min via INTRAVENOUS

## 2018-01-31 MED ORDER — DEXAMETHASONE SODIUM PHOSPHATE 10 MG/ML IJ SOLN
INTRAMUSCULAR | Status: DC | PRN
  Administered 2018-01-31: 10 mg via INTRAVENOUS

## 2018-01-31 MED ORDER — ONDANSETRON HCL 4 MG PO TABS: 4.0000 mg | ORAL_TABLET | Freq: Four times a day (QID) | ORAL | Status: DC | PRN

## 2018-01-31 MED ORDER — DEXAMETHASONE SODIUM PHOSPHATE 10 MG/ML IJ SOLN
INTRAMUSCULAR | Status: AC
  Filled 2018-01-31: qty 1

## 2018-01-31 MED ORDER — EPHEDRINE SULFATE-NACL 50-0.9 MG/10ML-% IV SOSY
PREFILLED_SYRINGE | INTRAVENOUS | Status: DC | PRN
  Administered 2018-01-31: 5 mg via INTRAVENOUS
  Administered 2018-01-31: 20 mg via INTRAVENOUS

## 2018-01-31 MED ORDER — OXYCODONE HCL 5 MG PO TABS
5.0000 mg | ORAL_TABLET | ORAL | Status: DC | PRN
  Administered 2018-01-31: 5 mg via ORAL

## 2018-01-31 MED ORDER — BUPIVACAINE-EPINEPHRINE (PF) 0.5% -1:200000 IJ SOLN
INTRAMUSCULAR | Status: DC | PRN
  Administered 2018-01-31: 10 mL

## 2018-01-31 MED ORDER — MIDAZOLAM HCL 2 MG/2ML IJ SOLN
INTRAMUSCULAR | Status: AC
  Filled 2018-01-31: qty 2

## 2018-01-31 MED ORDER — BUPIVACAINE-EPINEPHRINE (PF) 0.5% -1:200000 IJ SOLN
INTRAMUSCULAR | Status: AC
  Filled 2018-01-31: qty 30

## 2018-01-31 MED ORDER — ROCURONIUM BROMIDE 50 MG/5ML IV SOSY
PREFILLED_SYRINGE | INTRAVENOUS | Status: AC
  Filled 2018-01-31: qty 5

## 2018-01-31 MED ORDER — DEXAMETHASONE SODIUM PHOSPHATE 4 MG/ML IJ SOLN
4.0000 mg | Freq: Four times a day (QID) | INTRAMUSCULAR | Status: AC
  Administered 2018-01-31: 4 mg via INTRAVENOUS
  Filled 2018-01-31: qty 1

## 2018-01-31 MED ORDER — LIDOCAINE 2% (20 MG/ML) 5 ML SYRINGE
INTRAMUSCULAR | Status: DC | PRN
  Administered 2018-01-31: 40 mg via INTRAVENOUS

## 2018-01-31 MED ORDER — OXYCODONE HCL 5 MG PO TABS
ORAL_TABLET | ORAL | Status: AC
  Filled 2018-01-31: qty 1

## 2018-01-31 MED ORDER — EPHEDRINE 5 MG/ML INJ
INTRAVENOUS | Status: AC
  Filled 2018-01-31: qty 10

## 2018-01-31 MED ORDER — LACTATED RINGERS IV SOLN
INTRAVENOUS | Status: DC
  Administered 2018-01-31 (×2): via INTRAVENOUS

## 2018-01-31 MED ORDER — DIAZEPAM 5 MG PO TABS
ORAL_TABLET | ORAL | Status: AC
  Filled 2018-01-31: qty 1

## 2018-01-31 MED ORDER — PROPOFOL 10 MG/ML IV BOLUS
INTRAVENOUS | Status: AC
  Filled 2018-01-31: qty 20

## 2018-01-31 MED ORDER — ACETAMINOPHEN 650 MG RE SUPP: 650.0000 mg | RECTAL | Status: DC | PRN

## 2018-01-31 MED ORDER — OXYCODONE HCL 5 MG PO TABS
10.0000 mg | ORAL_TABLET | ORAL | Status: DC | PRN
  Administered 2018-01-31 – 2018-02-01 (×5): 10 mg via ORAL
  Filled 2018-01-31 (×4): qty 2

## 2018-01-31 MED ORDER — MORPHINE SULFATE (PF) 4 MG/ML IV SOLN: 4.0000 mg | INTRAVENOUS | Status: DC | PRN

## 2018-01-31 MED ORDER — LACTATED RINGERS IV SOLN: INTRAVENOUS | Status: DC

## 2018-01-31 MED ORDER — ONDANSETRON HCL 4 MG/2ML IJ SOLN
INTRAMUSCULAR | Status: DC | PRN
  Administered 2018-01-31: 4 mg via INTRAVENOUS

## 2018-01-31 MED ORDER — ONDANSETRON HCL 4 MG/2ML IJ SOLN: 4.0000 mg | Freq: Four times a day (QID) | INTRAMUSCULAR | Status: DC | PRN

## 2018-01-31 MED ORDER — OXYCODONE HCL 5 MG PO TABS
ORAL_TABLET | ORAL | Status: AC
  Filled 2018-01-31: qty 2

## 2018-01-31 MED ORDER — HEMOSTATIC AGENTS (NO CHARGE) OPTIME
TOPICAL | Status: DC | PRN
  Administered 2018-01-31 (×3): 1

## 2018-01-31 MED ORDER — DEXAMETHASONE 4 MG PO TABS
4.0000 mg | ORAL_TABLET | Freq: Four times a day (QID) | ORAL | Status: AC
  Administered 2018-01-31: 4 mg via ORAL
  Filled 2018-01-31: qty 1

## 2018-01-31 MED ORDER — CYCLOBENZAPRINE HCL 10 MG PO TABS
ORAL_TABLET | ORAL | Status: AC
  Filled 2018-01-31: qty 1

## 2018-01-31 MED ORDER — CEFAZOLIN SODIUM 1 G IJ SOLR
INTRAMUSCULAR | Status: AC
  Filled 2018-01-31: qty 20

## 2018-01-31 MED ORDER — ONDANSETRON HCL 4 MG/2ML IJ SOLN
INTRAMUSCULAR | Status: AC
  Filled 2018-01-31: qty 2

## 2018-01-31 MED ORDER — BACITRACIN ZINC 500 UNIT/GM EX OINT
TOPICAL_OINTMENT | CUTANEOUS | Status: AC
  Filled 2018-01-31: qty 28.35

## 2018-01-31 MED ORDER — CEFAZOLIN SODIUM-DEXTROSE 2-4 GM/100ML-% IV SOLN
2.0000 g | INTRAVENOUS | Status: AC
  Administered 2018-01-31: 2 g via INTRAVENOUS

## 2018-01-31 MED ORDER — FENTANYL CITRATE (PF) 100 MCG/2ML IJ SOLN
INTRAMUSCULAR | Status: DC | PRN
  Administered 2018-01-31: 50 ug via INTRAVENOUS
  Administered 2018-01-31: 100 ug via INTRAVENOUS
  Administered 2018-01-31: 50 ug via INTRAVENOUS
  Administered 2018-01-31: 100 ug via INTRAVENOUS
  Administered 2018-01-31: 50 ug via INTRAVENOUS

## 2018-01-31 MED ORDER — HYDROMORPHONE HCL 1 MG/ML IJ SOLN
0.2500 mg | INTRAMUSCULAR | Status: DC | PRN
  Administered 2018-01-31 (×2): 0.5 mg via INTRAVENOUS

## 2018-01-31 MED ORDER — MIDAZOLAM HCL 5 MG/5ML IJ SOLN
INTRAMUSCULAR | Status: DC | PRN
  Administered 2018-01-31: 2 mg via INTRAVENOUS

## 2018-01-31 MED ORDER — MENTHOL 3 MG MT LOZG: 1.0000 | LOZENGE | OROMUCOSAL | Status: DC | PRN

## 2018-01-31 MED ORDER — SUGAMMADEX SODIUM 200 MG/2ML IV SOLN
INTRAVENOUS | Status: DC | PRN
  Administered 2018-01-31: 125 mg via INTRAVENOUS

## 2018-01-31 MED ORDER — ARTIFICIAL TEARS OPHTHALMIC OINT
TOPICAL_OINTMENT | OPHTHALMIC | Status: AC
  Filled 2018-01-31: qty 3.5

## 2018-01-31 SURGICAL SUPPLY — 72 items
BAG DECANTER FOR FLEXI CONT (MISCELLANEOUS) ×3 IMPLANT
BASKET BONE COLLECTION (BASKET) ×3 IMPLANT
BENZOIN TINCTURE PRP APPL 2/3 (GAUZE/BANDAGES/DRESSINGS) ×3 IMPLANT
BIT DRILL NEURO 2X3.1 SFT TUCH (MISCELLANEOUS) ×2 IMPLANT
BLADE SURG 15 STRL LF DISP TIS (BLADE) ×2 IMPLANT
BLADE SURG 15 STRL SS (BLADE) ×4
BLADE ULTRA TIP 2M (BLADE) ×3 IMPLANT
BUR BARREL STRAIGHT FLUTE 4.0 (BURR) ×3 IMPLANT
BUR MATCHSTICK NEURO 3.0 LAGG (BURR) ×6 IMPLANT
CANISTER SUCT 3000ML PPV (MISCELLANEOUS) ×3 IMPLANT
CARTRIDGE OIL MAESTRO DRILL (MISCELLANEOUS) ×1 IMPLANT
CLOSURE WOUND 1/2 X4 (GAUZE/BANDAGES/DRESSINGS) ×1
COVER MAYO STAND STRL (DRAPES) ×3 IMPLANT
DECANTER SPIKE VIAL GLASS SM (MISCELLANEOUS) ×3 IMPLANT
DERMABOND ADVANCED (GAUZE/BANDAGES/DRESSINGS) ×2
DERMABOND ADVANCED .7 DNX12 (GAUZE/BANDAGES/DRESSINGS) ×1 IMPLANT
DEVICE FUSION VIST S 14X14X6MM (Trauma) ×2 IMPLANT
DIFFUSER DRILL AIR PNEUMATIC (MISCELLANEOUS) ×3 IMPLANT
DRAIN JACKSON PRATT 10MM FLAT (MISCELLANEOUS) ×3 IMPLANT
DRAPE HALF SHEET 40X57 (DRAPES) ×6 IMPLANT
DRAPE LAPAROTOMY 100X72 PEDS (DRAPES) ×3 IMPLANT
DRAPE MICROSCOPE LEICA (MISCELLANEOUS) IMPLANT
DRAPE POUCH INSTRU U-SHP 10X18 (DRAPES) ×3 IMPLANT
DRAPE SURG 17X23 STRL (DRAPES) ×6 IMPLANT
DRILL NEURO 2X3.1 SOFT TOUCH (MISCELLANEOUS) ×6
DRSG OPSITE POSTOP 4X8 (GAUZE/BANDAGES/DRESSINGS) ×3 IMPLANT
ELECT REM PT RETURN 9FT ADLT (ELECTROSURGICAL) ×3
ELECTRODE REM PT RTRN 9FT ADLT (ELECTROSURGICAL) ×1 IMPLANT
EVACUATOR SILICONE 100CC (DRAIN) ×6 IMPLANT
FLOSEAL 5ML (HEMOSTASIS) ×6 IMPLANT
GAUZE 4X4 16PLY RFD (DISPOSABLE) IMPLANT
GAUZE SPONGE 4X4 12PLY STRL (GAUZE/BANDAGES/DRESSINGS) IMPLANT
GLOVE BIO SURGEON STRL SZ 6.5 (GLOVE) ×8 IMPLANT
GLOVE BIO SURGEON STRL SZ8 (GLOVE) ×3 IMPLANT
GLOVE BIO SURGEON STRL SZ8.5 (GLOVE) ×3 IMPLANT
GLOVE BIO SURGEONS STRL SZ 6.5 (GLOVE) ×4
GLOVE BIOGEL PI IND STRL 6.5 (GLOVE) ×5 IMPLANT
GLOVE BIOGEL PI IND STRL 7.5 (GLOVE) ×1 IMPLANT
GLOVE BIOGEL PI INDICATOR 6.5 (GLOVE) ×10
GLOVE BIOGEL PI INDICATOR 7.5 (GLOVE) ×2
GLOVE ECLIPSE 7.0 STRL STRAW (GLOVE) ×3 IMPLANT
GLOVE SURG SS PI 6.5 STRL IVOR (GLOVE) ×3 IMPLANT
GOWN STRL REUS W/ TWL LRG LVL3 (GOWN DISPOSABLE) ×5 IMPLANT
GOWN STRL REUS W/ TWL XL LVL3 (GOWN DISPOSABLE) ×1 IMPLANT
GOWN STRL REUS W/TWL LRG LVL3 (GOWN DISPOSABLE) ×10
GOWN STRL REUS W/TWL XL LVL3 (GOWN DISPOSABLE) ×2
HEMOSTAT POWDER KIT SURGIFOAM (HEMOSTASIS) IMPLANT
INTERBODY TM 14X14X6-0DEG PAR (Metal Cage) ×3 IMPLANT
KIT BASIN OR (CUSTOM PROCEDURE TRAY) ×3 IMPLANT
KIT TURNOVER KIT B (KITS) ×3 IMPLANT
MARKER SKIN DUAL TIP RULER LAB (MISCELLANEOUS) ×3 IMPLANT
NEEDLE HYPO 22GX1.5 SAFETY (NEEDLE) ×3 IMPLANT
NEEDLE SPNL 18GX3.5 QUINCKE PK (NEEDLE) ×3 IMPLANT
NS IRRIG 1000ML POUR BTL (IV SOLUTION) ×3 IMPLANT
OIL CARTRIDGE MAESTRO DRILL (MISCELLANEOUS) ×3
PACK LAMINECTOMY NEURO (CUSTOM PROCEDURE TRAY) ×3 IMPLANT
PEEK VISTA 14X14X7MM (Peek) ×3 IMPLANT
PIN DISTRACTION 14MM (PIN) ×6 IMPLANT
PLATE ANT CERV XTEND 4 LV 69 (Plate) ×3 IMPLANT
PUTTY KINEX BIOACTIVE 5CC (Bone Implant) ×3 IMPLANT
RUBBERBAND STERILE (MISCELLANEOUS) IMPLANT
SCREW XTD VAR 4.2 SELF TAP (Screw) ×30 IMPLANT
SPONGE INTESTINAL PEANUT (DISPOSABLE) ×6 IMPLANT
SPONGE SURGIFOAM ABS GEL 100 (HEMOSTASIS) ×3 IMPLANT
STRIP CLOSURE SKIN 1/2X4 (GAUZE/BANDAGES/DRESSINGS) ×2 IMPLANT
SUT VIC AB 0 CT1 27 (SUTURE) ×4
SUT VIC AB 0 CT1 27XBRD ANTBC (SUTURE) ×2 IMPLANT
SUT VIC AB 3-0 SH 8-18 (SUTURE) ×6 IMPLANT
TOWEL GREEN STERILE (TOWEL DISPOSABLE) ×3 IMPLANT
TOWEL GREEN STERILE FF (TOWEL DISPOSABLE) ×3 IMPLANT
VISTA S O 14X14X6MM (Trauma) ×6 IMPLANT
WATER STERILE IRR 1000ML POUR (IV SOLUTION) ×3 IMPLANT

## 2018-01-31 NOTE — Op Note (Signed)
Brief history: The patient is a 57 year old white male who is developed progressively worsening pain, numbness, tingling in his extremities with difficulty with ambulation, falls, incontinence, etc.  He was worked up with a cervical MRI which demonstrated severe stenosis.  I discussed the various treatment options with the patient.  He has decided to proceed with surgery after weighing the risks, benefits, and alternatives.  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 Earle Gell  Asst.: Dr. Kathyrn Sheriff  Anesthesia: Gen. endotracheal  Estimated blood loss: 150 cc  Drains: 10 mm flat Jackson-Pratt drain in the prevertebral space  Complications: None  Description of procedure: The patient was brought to the operating room by the anesthesia team. General endotracheal anesthesia was induced. A roll was placed under the patient's shoulders to keep the neck in the neutral position. The patient's anterior cervical region was then prepared with Betadine scrub and Betadine solution. Sterile drapes were applied.  The area to be incised was then injected with Marcaine with epinephrine solution. I then used a scalpel to make a transverse incision in the patient's left anterior neck. I used the Metzenbaum scissors to divide the platysmal muscle and then to dissect medial to the sternocleidomastoid muscle, jugular vein, and carotid artery. I carefully dissected down towards the anterior cervical spine identifying  the esophagus and retracting it medially. Then using Kitner swabs to clear soft tissue from the anterior cervical spine. We then inserted a bent spinal needle into the upper exposed intervertebral disc space. We then obtained intraoperative radiographs confirm our location.  I then used electrocautery to detach the medial border of the longus colli muscle bilaterally from the C3-4, C4-5, C5-6 and C6-7 intervertebral disc spaces. I then inserted the Caspar self-retaining retractor underneath the longus colli muscle bilaterally to provide exposure.  We then incised the intervertebral disc at C4-5. We then performed a partial intervertebral discectomy with a pituitary forceps and the Karlin curettes. I then inserted distraction screws into the vertebral bodies at C4-5. We then distracted the interspace. We then used the high-speed drill to decorticate the vertebral endplates at Z6-6, to drill away the remainder of the intervertebral disc, to drill away some posterior spondylosis, and to thin out the posterior longitudinal ligament. I then incised ligament with the arachnoid knife. We then removed the ligament with a Kerrison punches undercutting the vertebral endplates and decompressing the thecal sac. We then performed foraminotomies about the bilateral C5 nerve roots. This completed the decompression at this level.  We then repeated this procedure in analogous fashion at C3-4, C5-6 and C6-7 decompressing the thecal sac and the bilateral C4, C6 and C7 nerve roots  We now turned our to attention to the interbody fusion. We used the trial spacers to determine the appropriate size for the interbody prosthesis. We then pre-filled prosthesis with a combination of local morcellized autograft bone that we obtained during decompression as well as Kinnex bone graft extender. We then inserted the prosthesis into the distracted interspace at C3-4, C4-5, C5-6 and C6-7. We then removed the distraction screws. There was a  good snug fit of the prosthesis in the  interspace.  Having completed the fusion we now turned attention to the anterior spinal instrumentation. We used the high-speed drill to drill away some anterior spondylosis at the disc spaces so that the plate lay down flat. We selected the appropriate length titanium anterior cervical plate. We laid it along the anterior aspect of the vertebral bodies from C3-C7. We then drilled 13 mm holes at C3, C4, C5, C6 and C7. We then secured the plate to the vertebral bodies by placing two 14 mm self-tapping screws at C3, C4, C5, C6 and C7. We then obtained intraoperative radiograph. The demonstrating good position of the instrumentation. We therefore secured the screws the plate the locking each cam. This completed the instrumentation.  We then obtained hemostasis using bipolar electrocautery. We irrigated the wound out with bacitracin solution. We then removed the retractor. We inspected the esophagus for any damage. There was none apparent.  We placed a 10 mm flat Jackson-Pratt drain in the prevertebral space and tunneled it out through separate stab wound.  We then reapproximated patient's platysmal muscle with interrupted 3-0 Vicryl suture. We then reapproximated the subcutaneous tissue with interrupted 3-0 Vicryl suture. The skin was reapproximated with Steri-Strips and benzoin. The wound was then covered with bacitracin ointment. A sterile dressing was applied. The drapes were removed. Patient was subsequently extubated by the anesthesia team and transported to the post anesthesia care unit in stable condition. All sponge instrument and needle counts were reportedly correct at the end of this case.

## 2018-01-31 NOTE — Anesthesia Procedure Notes (Signed)
Procedure Name: Intubation Date/Time: 01/31/2018 11:19 AM Performed by: Lowella Dell, CRNA Pre-anesthesia Checklist: Patient identified, Emergency Drugs available, Suction available, Patient being monitored and Timeout performed Patient Re-evaluated:Patient Re-evaluated prior to induction Oxygen Delivery Method: Circle system utilized Preoxygenation: Pre-oxygenation with 100% oxygen Induction Type: IV induction Ventilation: Mask ventilation without difficulty Laryngoscope Size: Glidescope (Elective) Grade View: Grade I Tube type: Oral Tube size: 7.5 mm Number of attempts: 1 Airway Equipment and Method: Stylet and Oral airway Placement Confirmation: ETT inserted through vocal cords under direct vision,  positive ETCO2 and breath sounds checked- equal and bilateral Secured at: 23 cm Tube secured with: Tape Dental Injury: Teeth and Oropharynx as per pre-operative assessment  Comments: Intubation by Lupita Shutter, SRNA.  Head and neck maintained in neutral, midline position.

## 2018-01-31 NOTE — Anesthesia Postprocedure Evaluation (Signed)
Anesthesia Post Note  Patient: Bryan Lane.  Procedure(s) Performed: ANTERIOR CERVICAL DECOMPRESSION/DISCECTOMY FUSION, INTERBODY PROSTHESIS, PLATE/SCREWS CERVICAL THREE- CERVICAL FOUR, CERVICAL FOUR - CERVICAL FIVE, CERVICAL FIVE - CERVICAL SIX, CERVICAL SIX- CERVICAL SEVEN (N/A Spine Cervical)     Patient location during evaluation: PACU Anesthesia Type: General Level of consciousness: awake and alert Pain management: pain level controlled Vital Signs Assessment: post-procedure vital signs reviewed and stable Respiratory status: spontaneous breathing, nonlabored ventilation, respiratory function stable and patient connected to nasal cannula oxygen Cardiovascular status: blood pressure returned to baseline and stable Postop Assessment: no apparent nausea or vomiting Anesthetic complications: no    Last Vitals:  Vitals:   01/31/18 1659 01/31/18 1727  BP: 136/80 (!) 145/98  Pulse: 97 (!) 105  Resp: (!) 9 (!) 21  Temp:  37.1 C  SpO2: 93% 97%    Last Pain:  Vitals:   01/31/18 1727  TempSrc: Oral  PainSc:                  Effie Berkshire

## 2018-01-31 NOTE — H&P (Signed)
Subjective: The patient is a 57 year old white male who has complained of neck and arm pain and numbness, incontinence consistent with a cervical radiculopathy/myelopathy.  He was worked up a cervical MRI which demonstrated multilevel spondylosis and stenosis.  I discussed the various treatment options with the patient.  He has decided to proceed with surgery.  Past Medical History:  Diagnosis Date  . Bowel incontinence    due to cervical disc issue  . Depression    with pain  . Dyspnea    with  exertion and pain  . Hypertension    2 years ago was on med for bp. hctz.  Marland Kitchen PTSD (post-traumatic stress disorder)   . Urine incontinence 01/2018   due to cervical issue    Past Surgical History:  Procedure Laterality Date  . HAND SURGERY Right    "BB removal"  . SHOULDER SURGERY Left 2017   acl repair  . Wisdom teeth removal      No Known Allergies  Social History   Tobacco Use  . Smoking status: Current Every Day Smoker    Packs/day: 0.50    Years: 40.00    Pack years: 20.00  . Smokeless tobacco: Never Used  Substance Use Topics  . Alcohol use: Not Currently    Family History  Problem Relation Age of Onset  . Diabetes Father   . Heart attack Mother   . Alcoholism Mother   . Hypertension Sister    Prior to Admission medications   Medication Sig Start Date End Date Taking? Authorizing Provider  acetaminophen (TYLENOL) 500 MG tablet Take 1,000 mg by mouth every morning.    Yes [provider]  diazepam (VALIUM) 5 MG tablet Take 0.5 tablet three times daily x 1 week, then increase to 1 tablet three times daily Patient taking differently: Take 5 mg by mouth 3 (three) times daily. Take 0.5 tablet three times daily x 1 week, then increase to 1 tablet three times daily 01/14/18  Yes Patel, Donika K, DO  ibuprofen (ADVIL,MOTRIN) 200 MG tablet Take 400 mg by mouth at bedtime.    Yes [provider]  naproxen sodium (ALEVE) 220 MG tablet Take 220 mg by mouth daily at  3 pm.    Yes [provider]     Review of Systems  Positive ROS: As above  All other systems have been reviewed and were otherwise negative with the exception of those mentioned in the HPI and as above.  Objective: Vital signs in last 24 hours: Temp:  [97.4 F (36.3 C)] 97.4 F (36.3 C) (08/15 0939) Pulse Rate:  [82] 82 (08/15 0939) Resp:  [20] 20 (08/15 0939) BP: (128)/(84) 128/84 (08/15 0939) SpO2:  [100 %] 100 % (08/15 0939) Weight:  [98 kg] 98 kg (08/15 0939) Estimated body mass index is 27.73 kg/m as calculated from the following:   Height as of 01/28/18: 6\' 2"  (1.88 m).   Weight as of this encounter: 98 kg.   General Appearance: Alert Head: Normocephalic, without obvious abnormality, atraumatic Eyes: PERRL, conjunctiva/corneas clear, EOM's intact,    Ears: Normal  Throat: Normal  Neck: Supple, Back: unremarkable Lungs: Clear to auscultation bilaterally, respirations unlabored Heart: Regular rate and rhythm, no murmur, rub or gallop Abdomen: Soft, non-tender Extremities: Extremities normal, atraumatic, no cyanosis or edema Skin: unremarkable  NEUROLOGIC:   Mental status: alert and oriented,Motor Exam -the patient has bilateral hand grip weakness Sensory Exam - grossly normal Reflexes:  Coordination - grossly normal Gait -unsteady  balance - grossly normal Cranial Nerves: I: smell Not tested  II: visual acuity  OS: Normal  OD: Normal   II: visual fields Full to confrontation  II: pupils Equal, round, reactive to light  III,VII: ptosis None  III,IV,VI: extraocular muscles  Full ROM  V: mastication Normal  V: facial light touch sensation  Normal  V,VII: corneal reflex  Present  VII: facial muscle function - upper  Normal  VII: facial muscle function - lower Normal  VIII: hearing Not tested  IX: soft palate elevation  Normal  IX,X: gag reflex Present  XI: trapezius strength  5/5  XI: sternocleidomastoid strength 5/5  XI: neck flexion strength   5/5  XII: tongue strength  Normal    Data Review Lab Results  Component Value Date   WBC 11.4 (H) 01/28/2018   HGB 13.5 01/28/2018   HCT 40.9 01/28/2018   MCV 94.5 01/28/2018   PLT 309 01/28/2018   Lab Results  Component Value Date   NA 141 01/28/2018   K 4.4 01/28/2018   CL 107 01/28/2018   CO2 28 01/28/2018   BUN 14 01/28/2018   CREATININE 1.16 01/28/2018   GLUCOSE 93 01/28/2018   No results found for: INR, PROTIME  Assessment/Plan: C3-4, C4-5, C5-6, 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  I have reviewed his imaging studies with him and pointed out the abnormalities.  I have discussed the various treatment options including surgery.  I have described the surgical treatment option of a C3-4, C4-5, C5-6 and C6-7 anterior cervical discectomy, fusion and plating.  I have shown him surgical models.  I have given him a surgical pamphlet.  We have discussed the risks, benefits, alternatives, expected postoperative course, and likelihood of achieving our goals with surgery.  I have answered all his questions.  He has decided to proceed with surgery.   Ophelia Charter 01/31/2018 10:32 AM

## 2018-01-31 NOTE — Transfer of Care (Signed)
Immediate Anesthesia Transfer of Care Note  Patient: Bryan Lane.  Procedure(s) Performed: ANTERIOR CERVICAL DECOMPRESSION/DISCECTOMY FUSION, INTERBODY PROSTHESIS, PLATE/SCREWS CERVICAL THREE- CERVICAL FOUR, CERVICAL FOUR - CERVICAL FIVE, CERVICAL FIVE - CERVICAL SIX, CERVICAL SIX- CERVICAL SEVEN (N/A Spine Cervical)  Patient Location: PACU  Anesthesia Type:General  Level of Consciousness: awake, alert  and oriented  Airway & Oxygen Therapy: Patient Spontanous Breathing and Patient connected to face mask oxygen  Post-op Assessment: Report given to RN and Post -op Vital signs reviewed and stable  Post vital signs: Reviewed and stable  Last Vitals:  Vitals Value Taken Time  BP 130/70 01/31/2018  3:59 PM  Temp    Pulse 108 01/31/2018  4:04 PM  Resp 16 01/31/2018  4:04 PM  SpO2 96 % 01/31/2018  4:04 PM  Vitals shown include unvalidated device data.  Last Pain:  Vitals:   01/31/18 1000  TempSrc:   PainSc: 0-No pain      Patients Stated Pain Goal: 2 (07/68/08 8110)  Complications: No apparent anesthesia complications

## 2018-01-31 NOTE — Anesthesia Preprocedure Evaluation (Addendum)
Anesthesia Evaluation  Patient identified by MRN, date of birth, ID band Patient awake    Reviewed: Allergy & Precautions, NPO status , Patient's Chart, lab work & pertinent test results  Airway Mallampati: I  TM Distance: >3 FB Neck ROM: Full    Dental  (+) Teeth Intact, Dental Advisory Given   Pulmonary Current Smoker,    breath sounds clear to auscultation       Cardiovascular hypertension,  Rhythm:Regular Rate:Normal     Neuro/Psych PSYCHIATRIC DISORDERS Anxiety Depression    GI/Hepatic negative GI ROS, Neg liver ROS,   Endo/Other  negative endocrine ROS  Renal/GU negative Renal ROS     Musculoskeletal negative musculoskeletal ROS (+)   Abdominal Normal abdominal exam  (+)   Peds  Hematology negative hematology ROS (+)   Anesthesia Other Findings   Reproductive/Obstetrics                           Lab Results  Component Value Date   WBC 11.4 (H) 01/28/2018   HGB 13.5 01/28/2018   HCT 40.9 01/28/2018   MCV 94.5 01/28/2018   PLT 309 01/28/2018   Lab Results  Component Value Date   CREATININE 1.16 01/28/2018   BUN 14 01/28/2018   NA 141 01/28/2018   K 4.4 01/28/2018   CL 107 01/28/2018   CO2 28 01/28/2018   No results found for: INR, PROTIME  EKG: normal sinus rhythm.  Anesthesia Physical Anesthesia Plan  ASA: III  Anesthesia Plan: General   Post-op Pain Management:    Induction: Intravenous  PONV Risk Score and Plan: 2 and Ondansetron, Dexamethasone and Midazolam  Airway Management Planned: Oral ETT and Video Laryngoscope Planned  Additional Equipment: None  Intra-op Plan:   Post-operative Plan: Extubation in OR  Informed Consent: I have reviewed the patients History and Physical, chart, labs and discussed the procedure including the risks, benefits and alternatives for the proposed anesthesia with the patient or authorized representative who has indicated  his/her understanding and acceptance.   Dental advisory given  Plan Discussed with: CRNA  Anesthesia Plan Comments:        Anesthesia Quick Evaluation

## 2018-01-31 NOTE — Progress Notes (Signed)
Subjective: The patient is somnolent but easily arousable.  He is in no apparent distress.  He looks well.  Objective: Vital signs in last 24 hours: Temp:  [97.4 F (36.3 C)-97.9 F (36.6 C)] 97.9 F (36.6 C) (08/15 1559) Pulse Rate:  [82-108] 105 (08/15 1614) Resp:  [15-20] 15 (08/15 1614) BP: (128-130)/(70-84) 130/79 (08/15 1614) SpO2:  [95 %-100 %] 95 % (08/15 1614) Weight:  [98 kg] 98 kg (08/15 0939) Estimated body mass index is 27.73 kg/m as calculated from the following:   Height as of 01/28/18: 6\' 2"  (1.88 m).   Weight as of this encounter: 98 kg.   Intake/Output from previous day: No intake/output data recorded. Intake/Output this shift: Total I/O In: 1000 [I.V.:1000] Out: 350 [Urine:250; Blood:100]  Physical exam the patient is somnolent but easily arousable.  He is moving all 4 extremities well.  There is no weakness in his deltoids.  The patient's dressing is clean and dry.  There is no hematoma or shift.  Lab Results: No results for input(s): WBC, HGB, HCT, PLT in the last 72 hours. BMET No results for input(s): NA, K, CL, CO2, GLUCOSE, BUN, CREATININE, CALCIUM in the last 72 hours.  Studies/Results: Dg Cervical Spine 2-3 Views  Result Date: 01/31/2018 CLINICAL DATA:  C3-C7 ACDF EXAM: CERVICAL SPINE - 2-3 VIEW COMPARISON:  None. FINDINGS: Two intraprocedural images show localization at the C3-4 level followed by C3-4, C4-5, C5-6, and C6-7 ACDF with ventral plate. The lower construct is partially obscured by the shoulders. There is no evident hardware complication or fracture. IMPRESSION: Intraoperative localization for C3-C7 ACDF. Electronically Signed   By: Monte Fantasia M.D.   On: 01/31/2018 15:54    Assessment/Plan: The patient is doing well.  I spoke with his parents.  LOS: 0 days     Ophelia Charter 01/31/2018, 4:25 PM

## 2018-02-01 ENCOUNTER — Other Ambulatory Visit: Payer: Self-pay

## 2018-02-01 MED ORDER — DOCUSATE SODIUM 100 MG PO CAPS: 100.0000 mg | ORAL_CAPSULE | Freq: Two times a day (BID) | ORAL | 0 refills | Status: DC

## 2018-02-01 MED ORDER — OXYCODONE HCL 5 MG PO TABS: 5.0000 mg | ORAL_TABLET | ORAL | 0 refills | Status: DC | PRN

## 2018-02-01 MED ORDER — CYCLOBENZAPRINE HCL 10 MG PO TABS: 10.0000 mg | ORAL_TABLET | Freq: Three times a day (TID) | ORAL | 1 refills | Status: DC | PRN

## 2018-02-01 MED FILL — Thrombin (Recombinant) For Soln 20000 Unit: CUTANEOUS | Qty: 1 | Status: AC

## 2018-02-01 MED FILL — DOK 100 MG SOFTGEL: 100 | 50 days supply | Qty: 100 | Fill #0

## 2018-02-01 MED FILL — oxyCODONE HCL 5 MG TABS: 5 | 5 days supply | Qty: 30 | Fill #0

## 2018-02-01 MED FILL — CYCLOBENZAPRINE HCL 10 MG T: 10 | 17 days supply | Qty: 50 | Fill #0

## 2018-02-01 NOTE — Progress Notes (Signed)
Inpatient Rehabilitation-Admissions Coordinator   Per PM&R consult note, pt is recommended for outpatient therapies at this time. AC will sign off and will communicate with floor CM/SW regarding dispo recommendations.   Jhonnie Garner, OTR/L  Rehab Admissions Coordinator  [phone] 02/01/2018 12:05 PM

## 2018-02-01 NOTE — Progress Notes (Signed)
Patient alert and oriented, mae's well, voiding adequate amount of urine, swallowing without difficulty, no c/o pain at time of discharge. Patient discharged home with family. Script and discharged instructions given to patient. Patient and family stated understanding of instructions given. Patient has an appointment with Dr. Jenkins   

## 2018-02-01 NOTE — Evaluation (Signed)
Occupational Therapy Evaluation Patient Details Name: Bryan Lane. MRN: [id number] DOB: 1960/09/19 Today's Date: 02/01/2018    History of Present Illness Pt is a 57 y/o male who presents to PT s/p C3-C7 ACDF on 01/31/18. PMH significant for HTN, PTSD, bowel/bladder incontinence prior to surgery.    Clinical Impression   PTA Pt requiring walking sticks for mobility and independent in ADL and mobility. Cervical handout provided and reviewed adls in detail. Pt educated on: brace, set an alarm at night for medication, correct bed positioning for sleeping, correct sequence for bed mobility, compensatory strategies with focus on sink level grooming. Educated pt on compensatory strategies and offered built up handles for self-feeding - Pt declining at this time. Pt observed at supervision level getting completely dressed, toilet transfer, sink level grooming. At this time recommending OPOT (neuro if possible) for follow up with function of BUE.     Follow Up Recommendations  Follow surgeon's recommendation for DC plan and follow-up therapies;Outpatient OT    Equipment Recommendations  Other (comment)(SPC)    Recommendations for Other Services       Precautions / Restrictions Precautions Precautions: Fall;Cervical Precaution Booklet Issued: Yes (comment) Precaution Comments: Reviewed in detail with pt. He was cued for maintenance of precautions with functional mobility.  Required Braces or Orthoses: Cervical Brace Cervical Brace: Hard collar;At all times Restrictions Weight Bearing Restrictions: No      Mobility Bed Mobility Overal bed mobility: Needs Assistance Bed Mobility: Rolling;Sidelying to Sit Rolling: Supervision Sidelying to sit: Supervision       General bed mobility comments: Pt sitting OOB in recliner when OT entered, returned to recliner  Transfers Overall transfer level: Needs assistance Equipment used: Straight cane Transfers: Sit to/from Stand Sit to  Stand: Min guard         General transfer comment: Hands-on guarding as pt powered up to full stand. Noted significant sway in static standing.     Balance Overall balance assessment: Needs assistance Sitting-balance support: Feet supported;No upper extremity supported Sitting balance-Leahy Scale: Fair     Standing balance support: No upper extremity supported;During functional activity Standing balance-Leahy Scale: Poor Standing balance comment: Requires assist or SPC use at times to maintain dynamic standing balance.                            ADL either performed or assessed with clinical judgement   ADL Overall ADL's : Needs assistance/impaired Eating/Feeding: Supervision/ safety Eating/Feeding Details (indicate cue type and reason): talked in depth with Pt about grasp on utensils. When discussing with me, he stated that he does not have a problem. Educated in built up handles and offered to get him some to try - he declined "I did all this research when I thought I had MS, I'm ok" Grooming: Oral care;Supervision/safety;Cueing for compensatory techniques;Standing Grooming Details (indicate cue type and reason): educated in cup method Upper Body Bathing: Min guard Upper Body Bathing Details (indicate cue type and reason): educated on safety while bathing - recommending that he sit for safety. Pt stated "I am going to sponge bathe until I am safe enough to get in the shower" declined 3 in 1 or shower chair Lower Body Bathing: Supervison/ safety;Sitting/lateral leans   Upper Body Dressing : Supervision/safety;Sitting Upper Body Dressing Details (indicate cue type and reason): able to don shirt and re-adjust brace without assist Lower Body Dressing: Supervision/safety;Sitting/lateral leans Lower Body Dressing Details (indicate cue type and reason): able  to don underwear, pants, socks, shoes, buckle belt Toilet Transfer: Supervision/safety;Ambulation(SPC)   Toileting-  Clothing Manipulation and Hygiene: Supervision/safety;Sitting/lateral Chartered certified accountant Details (indicate cue type and reason): Pt plans on sponge bathing until he can access shower safely Functional mobility during ADLs: Min guard;Cane General ADL Comments: Pt is overall supervision level for ADL - cues for precautions and education provided for compensatory strategies     Vision Baseline Vision/History: Wears glasses Wears Glasses: At all times Patient Visual Report: No change from baseline Vision Assessment?: No apparent visual deficits     Perception     Praxis      Pertinent Vitals/Pain Pain Assessment: Faces Faces Pain Scale: Hurts little more Pain Location: Incision site Pain Descriptors / Indicators: Operative site guarding Pain Intervention(s): Limited activity within patient's tolerance;Monitored during session     Hand Dominance Right   Extremity/Trunk Assessment Upper Extremity Assessment Upper Extremity Assessment: RUE deficits/detail;LUE deficits/detail RUE Deficits / Details: Decreased strength bilaterally consistent with pre-op diagnosis.  RUE Sensation: decreased light touch RUE Coordination: decreased fine motor;decreased gross motor LUE Deficits / Details: Decreased strength bilaterally consistent with pre-op diagnosis.  LUE Sensation: decreased light touch LUE Coordination: decreased fine motor;decreased gross motor   Lower Extremity Assessment Lower Extremity Assessment: Defer to PT evaluation RLE Deficits / Details: Decreased strength bilaterally consistent with pre-op diagnosis.  RLE Sensation: decreased light touch RLE Coordination: decreased fine motor;decreased gross motor LLE Deficits / Details: Decreased strength bilaterally consistent with pre-op diagnosis.  LLE Sensation: decreased light touch LLE Coordination: decreased fine motor;decreased gross motor   Cervical / Trunk Assessment Cervical / Trunk Assessment: Other  exceptions Cervical / Trunk Exceptions: s/p ACDF   Communication Communication Communication: No difficulties   Cognition Arousal/Alertness: Awake/alert Behavior During Therapy: WFL for tasks assessed/performed Overall Cognitive Status: Within Functional Limits for tasks assessed                                     General Comments  educated Pt on use of being smart of where he put down his SPC so that he's not constantly picking it up off the floor    Exercises     Shoulder Instructions      Home Living Family/patient expects to be discharged to:: Private residence Living Arrangements: Parent Available Help at Discharge: Family;Available 24 hours/day Type of Home: House Home Access: Level entry     Home Layout: One level     Bathroom Shower/Tub: Teacher, early years/pre: Standard     Home Equipment: (Hiking sticks)          Prior Functioning/Environment Level of Independence: Needs assistance  Gait / Transfers Assistance Needed: Pt reports 2 falls PTA ADL's / Homemaking Assistance Needed: Pt reports difficulty managing utensils            OT Problem List: Impaired balance (sitting and/or standing);Decreased coordination;Decreased knowledge of use of DME or AE;Decreased safety awareness;Decreased knowledge of precautions;Impaired sensation;Impaired UE functional use      OT Treatment/Interventions:      OT Goals(Current goals can be found in the care plan section) Acute Rehab OT Goals Patient Stated Goal: Home today OT Goal Formulation: With patient Time For Goal Achievement: 02/15/18 Potential to Achieve Goals: Good  OT Frequency:     Barriers to D/C:            Co-evaluation  AM-PAC PT "6 Clicks" Daily Activity     Outcome Measure Help from another person eating meals?: A Little Help from another person taking care of personal grooming?: A Little Help from another person toileting, which includes using  toliet, bedpan, or urinal?: A Little Help from another person bathing (including washing, rinsing, drying)?: A Little Help from another person to put on and taking off regular upper body clothing?: A Little Help from another person to put on and taking off regular lower body clothing?: A Little 6 Click Score: 18   End of Session Equipment Utilized During Treatment: Gait belt;Other (comment);Cervical collar(SPC) Nurse Communication: Mobility status  Activity Tolerance: Patient tolerated treatment well Patient left: in chair;with call bell/phone within reach  OT Visit Diagnosis: Unsteadiness on feet (R26.81);Muscle weakness (generalized) (M62.81);Other symptoms and signs involving the nervous system (R29.898);History of falling (Z91.81)                Time: 7116-5790 OT Time Calculation (min): 30 min Charges:  OT General Charges $OT Visit: 1 Visit OT Evaluation $OT Eval Moderate Complexity: 1 Mod OT Treatments $Self Care/Home Management : 8-22 mins  Hulda Humphrey OTR/L [place] 02/01/2018, 12:10 PM

## 2018-02-01 NOTE — Consult Note (Signed)
Physical Medicine and Rehabilitation Consult Reason for Consult: Decreased functional mobility Referring Physician: Dr. Arnoldo Morale   HPI: Bryan Lane. is a 57 y.o. right-handed male with history of PTSD, hypertension and tobacco abuse.  Per chart review patient lives with his father and stepmother.  Independent but with progressive decline over the past 6 weeks.  Resented 01/31/2018 with progressive neck and arm pain with numbness as well as bowel and bladder incontinent episodes.  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  Underwent multilevel C3-C7 anterior cervical discectomy with decompression as well as C3-4, 4-5, C5-6 and C6-7 interbody arthrodesis with anterior cervical plating from C3-C7 01/31/2018 per Dr. Arnoldo Morale.  Hospital course pain management.  Decadron protocol as indicated.  Cervical collar as directed.  Physical and Occupational Therapy evaluations pending.  MD has requested physical medicine rehab consult.   Review of Systems  Constitutional: Negative for chills and fever.  HENT: Negative for hearing loss.   Eyes: Negative for blurred vision and double vision.  Respiratory: Negative for shortness of breath.   Cardiovascular: Negative for chest pain, palpitations and leg swelling.  Gastrointestinal: Negative for nausea and vomiting.  Genitourinary:       Intermittent bouts of bowel bladder incontinence  Musculoskeletal: Positive for joint pain and myalgias.  Skin: Negative for rash.  Neurological: Positive for tingling.  Psychiatric/Behavioral: Positive for depression.  All other systems reviewed and are negative.  Past Medical History:  Diagnosis Date  . Bowel incontinence    due to cervical disc issue  . Depression    with pain  . Dyspnea    with  exertion and pain  . Hypertension    2 years ago was on med for bp. hctz.  Marland Kitchen PTSD (post-traumatic stress disorder)   .  Urine incontinence 01/2018   due to cervical issue   Past Surgical History:  Procedure Laterality Date  . HAND SURGERY Right    "BB removal"  . SHOULDER SURGERY Left 2017   acl repair  . Wisdom teeth removal     Family History  Problem Relation Age of Onset  . Diabetes Father   . Heart attack Mother   . Alcoholism Mother   . Hypertension Sister    Social History:  reports that he has been smoking. He has a 20.00 pack-year smoking history. He has never used smokeless tobacco. He reports that he drank alcohol. He reports that he has current or past drug history. Drug: Marijuana. Allergies: No Known Allergies Medications Prior to Admission  Medication Sig Dispense Refill  . acetaminophen (TYLENOL) 500 MG tablet Take 1,000 mg by mouth every morning.     . diazepam (VALIUM) 5 MG tablet Take 0.5 tablet three times daily x 1 week, then increase to 1 tablet three times daily (Patient taking differently: Take 5 mg by mouth 3 (three) times daily. Take 0.5 tablet three times daily x 1 week, then increase to 1 tablet three times daily) 90 tablet 2  . ibuprofen (ADVIL,MOTRIN) 200 MG tablet Take 400 mg by mouth at bedtime.     . naproxen sodium (ALEVE) 220 MG tablet Take 220 mg by mouth daily at 3 pm.       Home: Home Living Family/patient expects to be discharged to:: Private residence Living Arrangements: Parent, Other relatives  Functional History:   Functional Status:  Mobility:          ADL:  Cognition: Cognition Orientation Level: Oriented X4    Blood pressure 117/72, pulse 77, temperature 98.4 F (36.9 C), temperature source Oral, resp. rate 20, weight 98 kg, SpO2 98 %. Physical Exam  Vitals reviewed. Constitutional: He appears well-developed.  HENT:  Head: Normocephalic.  Eyes: EOM are normal.  Neck:  Cervical collar in place with drain  Cardiovascular: Normal rate, regular rhythm and normal heart sounds.  Respiratory: Effort normal and breath sounds normal. No  respiratory distress.  GI: Soft. Bowel sounds are normal. He exhibits no distension.  Neurological:  Patient is alert in no acute distress.  Follows simple commands.  Provides his name age and date of birth    No results found for this or any previous visit (from the past 58 hour(s)). Dg Cervical Spine 2-3 Views  Result Date: 01/31/2018 CLINICAL DATA:  C3-C7 ACDF EXAM: CERVICAL SPINE - 2-3 VIEW COMPARISON:  None. FINDINGS: Two intraprocedural images show localization at the C3-4 level followed by C3-4, C4-5, C5-6, and C6-7 ACDF with ventral plate. The lower construct is partially obscured by the shoulders. There is no evident hardware complication or fracture. IMPRESSION: Intraoperative localization for C3-C7 ACDF. Electronically Signed   By: Monte Fantasia M.D.   On: 01/31/2018 15:54    Pt up and standing at bedside ready to leave. Recommend outpt therapies.   Meredith Staggers, MD, [place] 02/01/2018   Lavon Paganini [place], PA-C 02/01/2018

## 2018-02-01 NOTE — Evaluation (Signed)
Physical Therapy Evaluation Patient Details Name: Bryan Lane. MRN: [id number] DOB: 06/30/1960 Today's Date: 02/01/2018   History of Present Illness  Pt is a 57 y/o male who presents to PT s/p C3-C7 ACDF on 01/31/18. PMH significant for HTN, PTSD, bowel/bladder incontinence prior to surgery.   Clinical Impression  Pt admitted with above diagnosis. Pt currently with functional limitations due to the deficits listed below (see PT Problem List). At the time of PT eval pt was able to perform transfers and ambulation with gross min guard assist to supervision for safety. Pt with improved balance and safety with use of SPC. Pt has hiking poles at home that he reports would be easier for him to use than a cane. Feel pt will need some sort of single UE support upon d/c. Pt reports improvement in strength and numbness bilaterally, however still has obvious deficits. Pt also describing frequent episodes of extensor tone prior to surgery and reports he has not had any since surgery. Feel pt would benefit from continued therapy at the outpatient level when MD allows. Acutely, pt will benefit from skilled PT to increase their independence and safety with mobility to allow discharge to the venue listed below.       Follow Up Recommendations Outpatient PT (to be set up by MD when appropriate per post-op protocol);Supervision for mobility/OOB    Equipment Recommendations  Cane    Recommendations for Other Services       Precautions / Restrictions Precautions Precautions: Fall;Cervical Precaution Booklet Issued: Yes (comment) Precaution Comments: Reviewed in detail with pt. He was cued for maintenance of precautions with functional mobility.  Required Braces or Orthoses: Cervical Brace Cervical Brace: Hard collar;At all times Restrictions Weight Bearing Restrictions: No      Mobility  Bed Mobility Overal bed mobility: Needs Assistance Bed Mobility: Rolling;Sidelying to Sit Rolling:  Supervision Sidelying to sit: Supervision       General bed mobility comments: VC's for proper log roll technique and maintenance of precautions. No assist required to complete with HOB flat and rails lowered to simulate home environment.   Transfers Overall transfer level: Needs assistance Equipment used: Straight cane Transfers: Sit to/from Stand Sit to Stand: Min guard         General transfer comment: Hands-on guarding as pt powered up to full stand. Noted significant sway in static standing.   Ambulation/Gait Ambulation/Gait assistance: Min guard;Min assist Gait Distance (Feet): 300 Feet Assistive device: Straight cane Gait Pattern/deviations: Step-through pattern;Decreased stride length;Trunk flexed Gait velocity: Decreased Gait velocity interpretation: 1.31 - 2.62 ft/sec, indicative of limited community ambulator General Gait Details: VC's for sequencing and general safety. Pt stggering at times and requiring assist to maintain balance and prevent fall.   Stairs Stairs: Yes Stairs assistance: Min guard Stair Management: One rail Right;With cane;Forwards Number of Stairs: 10 General stair comments: VC's for sequencing and general safety with stair negotiation.   Wheelchair Mobility    Modified Rankin (Stroke Patients Only)       Balance Overall balance assessment: Needs assistance Sitting-balance support: Feet supported;No upper extremity supported Sitting balance-Leahy Scale: Fair     Standing balance support: No upper extremity supported;During functional activity Standing balance-Leahy Scale: Poor Standing balance comment: Requires assist or SPC use at times to maintain dynamic standing balance.                              Pertinent Vitals/Pain Pain Assessment: Faces Faces  Pain Scale: Hurts little more Pain Location: Incision site Pain Descriptors / Indicators: Operative site guarding Pain Intervention(s): Limited activity within  patient's tolerance;Monitored during session;Repositioned    Home Living Family/patient expects to be discharged to:: Private residence Living Arrangements: Parent Available Help at Discharge: Family;Available 24 hours/day Type of Home: House Home Access: Level entry     Home Layout: One level Home Equipment: (Hiking sticks)      Prior Function Level of Independence: Needs assistance   Gait / Transfers Assistance Needed: Pt reports 2 falls PTA  ADL's / Homemaking Assistance Needed: Pt reports difficulty managing utensils        Hand Dominance        Extremity/Trunk Assessment   Upper Extremity Assessment Upper Extremity Assessment: RUE deficits/detail;LUE deficits/detail RUE Deficits / Details: Decreased strength bilaterally consistent with pre-op diagnosis.  RUE Sensation: decreased light touch RUE Coordination: decreased fine motor;decreased gross motor LUE Deficits / Details: Decreased strength bilaterally consistent with pre-op diagnosis.  LUE Sensation: decreased light touch LUE Coordination: decreased fine motor;decreased gross motor    Lower Extremity Assessment Lower Extremity Assessment: RLE deficits/detail;LLE deficits/detail RLE Deficits / Details: Decreased strength bilaterally consistent with pre-op diagnosis.  RLE Sensation: decreased light touch RLE Coordination: decreased fine motor;decreased gross motor LLE Deficits / Details: Decreased strength bilaterally consistent with pre-op diagnosis.  LLE Sensation: decreased light touch LLE Coordination: decreased fine motor;decreased gross motor    Cervical / Trunk Assessment Cervical / Trunk Assessment: Other exceptions Cervical / Trunk Exceptions: s/p ACDF  Communication   Communication: No difficulties  Cognition Arousal/Alertness: Awake/alert Behavior During Therapy: WFL for tasks assessed/performed Overall Cognitive Status: Within Functional Limits for tasks assessed                                         General Comments      Exercises     Assessment/Plan    PT Assessment Patient needs continued PT services  PT Problem List Decreased strength;Decreased range of motion;Decreased activity tolerance;Decreased balance;Decreased mobility;Decreased coordination;Decreased knowledge of use of DME;Decreased safety awareness;Decreased knowledge of precautions;Impaired sensation;Pain       PT Treatment Interventions DME instruction;Gait training;Stair training;Functional mobility training;Therapeutic activities;Therapeutic exercise;Neuromuscular re-education;Patient/family education    PT Goals (Current goals can be found in the Care Plan section)  Acute Rehab PT Goals Patient Stated Goal: Home today PT Goal Formulation: With patient Time For Goal Achievement: 02/08/18 Potential to Achieve Goals: Good    Frequency Min 5X/week   Barriers to discharge        Co-evaluation               AM-PAC PT "6 Clicks" Daily Activity  Outcome Measure Difficulty turning over in bed (including adjusting bedclothes, sheets and blankets)?: None Difficulty moving from lying on back to sitting on the side of the bed? : A Little Difficulty sitting down on and standing up from a chair with arms (e.g., wheelchair, bedside commode, etc,.)?: A Little Help needed moving to and from a bed to chair (including a wheelchair)?: A Little Help needed walking in hospital room?: A Little Help needed climbing 3-5 steps with a railing? : A Little 6 Click Score: 19    End of Session Equipment Utilized During Treatment: Gait belt;Cervical collar Activity Tolerance: Patient tolerated treatment well Patient left: in chair;with call bell/phone within reach Nurse Communication: Mobility status PT Visit Diagnosis: Unsteadiness on feet (R26.81);Pain;Other  symptoms and signs involving the nervous system (R29.898) Pain - part of body: (neck)    Time: 4383-7793 PT Time Calculation (min)  (ACUTE ONLY): 35 min   Charges:   PT Evaluation $PT Eval Moderate Complexity: 1 Mod PT Treatments $Gait Training: 8-22 mins        Rolinda Roan, PT, DPT Acute Rehabilitation Services Pager: [place] 02/01/2018, 11:36 AM

## 2018-02-01 NOTE — Discharge Summary (Signed)
Physician Discharge Summary  Patient ID: Bryan Lane. MRN: [id number] DOB/AGE: June 25, 1960 57 y.o.  Admit date: 01/31/2018 Discharge date: 02/01/2018  Admission Diagnoses: C3-4, C4-5, C5-6 and C6-7 degeneration, spondylosis, stenosis, cervical radiculopathy, cervical myelopathy, cervicalgia  Discharge Diagnoses: The same Active Problems:   Stenosis of cervical spine with myelopathy [facility])   Discharged Condition: good  Hospital Course: Performed a C3-4, C4-5, C5-6 and C6-7 anterior cervical discectomy, fusion and plating on patient on 01/31/2018.  The surgery went well.  The patient's postoperative course was unremarkable.  On postoperative day #1 the patient requested discharge home.  We discussed inpatient rehabilitation but he declined.  The patient was given oral and written discharge instructions.  All his questions were answered.  Consults: Physical therapy Significant Diagnostic Studies: None Treatments: C3-4, C4-5, C5-6 and C6-7 anterior cervical discectomy, fusion and plating. Discharge Exam: Blood pressure 117/72, pulse 77, temperature 98.4 F (36.9 C), temperature source Oral, resp. rate 20, weight 98 kg, SpO2 98 %. Patient is alert and pleasant.  He looks well.  His dressing is clean and dry.  There is no hematoma or shift.  The patient is moving all 4 extremities well.  There is no obvious weakness.  Disposition: Home  Discharge Instructions    Call MD for:  difficulty breathing, headache or visual disturbances   Complete by:  As directed    Call MD for:  extreme fatigue   Complete by:  As directed    Call MD for:  hives   Complete by:  As directed    Call MD for:  persistant dizziness or light-headedness   Complete by:  As directed    Call MD for:  persistant nausea and vomiting   Complete by:  As directed    Call MD for:  redness, tenderness, or signs of infection (pain, swelling, redness, odor or green/yellow discharge around incision site)   Complete by:  As  directed    Call MD for:  severe uncontrolled pain   Complete by:  As directed    Call MD for:  temperature >100.4   Complete by:  As directed    Diet - low sodium heart healthy   Complete by:  As directed    Discharge instructions   Complete by:  As directed    Call [phone] for a followup appointment. Take a stool softener while you are using pain medications.   Driving Restrictions   Complete by:  As directed    Do not drive for 2 weeks.   Increase activity slowly   Complete by:  As directed    Lifting restrictions   Complete by:  As directed    Do not lift more than 5 pounds. No excessive bending or twisting.   May shower / Bathe   Complete by:  As directed    Remove the dressing for 3 days after surgery.  You may shower, but leave the incision alone.   Remove dressing in 48 hours   Complete by:  As directed    Your stitches are under the scan and will dissolve by themselves. The Steri-Strips will fall off after you take a few showers. Do not rub back or pick at the wound, Leave the wound alone.     Allergies as of 02/01/2018   No Known Allergies     Medication List    STOP taking these medications   acetaminophen 500 MG tablet Commonly known as:  TYLENOL   diazepam 5 MG tablet Commonly  known as:  VALIUM   ibuprofen 200 MG tablet Commonly known as:  ADVIL,MOTRIN   naproxen sodium 220 MG tablet Commonly known as:  ALEVE     TAKE these medications   cyclobenzaprine 10 MG tablet Commonly known as:  FLEXERIL Take 1 tablet (10 mg total) by mouth 3 (three) times daily as needed for muscle spasms.   docusate sodium 100 MG capsule Commonly known as:  COLACE Take 1 capsule (100 mg total) by mouth 2 (two) times daily.   oxyCODONE 5 MG immediate release tablet Commonly known as:  Oxy IR/ROXICODONE Take 1 tablet (5 mg total) by mouth every 4 (four) hours as needed for moderate pain ((score 4 to 6)).        Signed: Ophelia Charter 02/01/2018, 6:46 AM

## 2018-02-02 ENCOUNTER — Encounter: Payer: Self-pay | Admitting: Medical

## 2018-02-03 ENCOUNTER — Encounter: Payer: Self-pay | Admitting: Medical

## 2018-02-03 ENCOUNTER — Telehealth: Payer: Self-pay | Admitting: Medical

## 2018-02-03 NOTE — Telephone Encounter (Signed)
Kim,  Pt was here the other day. I think he might have gotten here a little late but I was also running behind. He eventually had to leave. I wanted to see him before his upcoming surgery within next day or two of visit with me. No one asked me but I think pt is concerned he may have gotten no show fee. Will you call him and make sure he did not get charge for that last office visit past week.  Thanks, Mackie Pai, PA-C

## 2018-02-04 ENCOUNTER — Encounter (HOSPITAL_COMMUNITY): Payer: Self-pay | Admitting: Neurosurgery

## 2018-02-04 ENCOUNTER — Telehealth: Payer: Self-pay

## 2018-02-04 NOTE — Telephone Encounter (Signed)
Called patient to schedule [facility] follow up. Patient states he was advised by E. Saguier to call after his appointment with Neurosurgeon to schedule follow up visit.

## 2018-02-21 ENCOUNTER — Telehealth: Payer: Self-pay | Admitting: Medical

## 2018-02-21 NOTE — Telephone Encounter (Signed)
Was wondering how patient was doing did he have follow up post surgery with neurosurgeon yet? I had wanted to see him after that visit. Some time since his surgery so wanted update.

## 2018-02-22 ENCOUNTER — Telehealth: Payer: Self-pay | Admitting: *Deleted

## 2018-02-22 NOTE — Telephone Encounter (Signed)
Kristen,  This pt is cleared for anesthetic care at [facility].  Thanks,  Verlan Grotz 

## 2018-02-22 NOTE — Telephone Encounter (Signed)
Dr. Bryan Lemma, This pt actually cancelled his procedure and PV at this time d/t having spinal surgery.  Sorry to have bothered you!  He must have called to cancel between the time I sent the message to you.  Cyril Mourning

## 2018-02-22 NOTE — Telephone Encounter (Signed)
Called patient to schedule appointment for Hospital follow up. Left message for return call. 

## 2018-02-22 NOTE — Telephone Encounter (Signed)
Dr. Bryan Lemma and Jenny Reichmann,  I am getting this patient's chart ready for PV on 02-28-18.  He is scheduled for a screening colonoscopy on 03-12-18.  I noted a few things in his hx- he has had an unintentional 30 lb weight loss in the last two years.  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    Is he ok for [facility]?  Would you want an OV first since he did have this weight loss? Please advise.  Thanks, [employer]

## 2018-02-22 NOTE — Telephone Encounter (Signed)
See my note from 8/19. I will check again today to see if he is ready to schedule.

## 2018-02-25 NOTE — Telephone Encounter (Signed)
Hospital follow up has been scheduled for 03/05/18 @ 11:00 am. Patient sees Neurosurgeon on 03/01/18.

## 2018-02-25 NOTE — Telephone Encounter (Addendum)
You can go ahead and call pt for follow up with me. Did he ever follow up with the neurosurgeon. Did they approve him to start PT? Is that planned? His surgery was almost 3 weeks ago. At this point if he has not seen neurosurgeon for follow upwould like him to follow up with me.Please do not give him late appointment. In fact want him to have 1 pm appointment slot with me. He came in and left for last appointment since I was running behind on last scheduled visit date with me.

## 2018-02-25 NOTE — Telephone Encounter (Signed)
Ok that will work thanks for looking into that for me.

## 2018-02-28 ENCOUNTER — Encounter

## 2018-03-05 ENCOUNTER — Ambulatory Visit (INDEPENDENT_AMBULATORY_CARE_PROVIDER_SITE_OTHER): Admitting: Medical

## 2018-03-05 ENCOUNTER — Encounter: Payer: Self-pay | Admitting: Medical

## 2018-03-05 VITALS — BP 139/80 | HR 97 | Temp 98.2°F | Resp 16 | Ht 74.0 in | Wt 206.4 lb

## 2018-03-05 DIAGNOSIS — E785 Hyperlipidemia, unspecified: Secondary | ICD-10-CM | POA: Diagnosis not present

## 2018-03-05 DIAGNOSIS — F17200 Nicotine dependence, unspecified, uncomplicated: Secondary | ICD-10-CM

## 2018-03-05 DIAGNOSIS — Z9889 Other specified postprocedural states: Secondary | ICD-10-CM

## 2018-03-05 DIAGNOSIS — R030 Elevated blood-pressure reading, without diagnosis of hypertension: Secondary | ICD-10-CM | POA: Diagnosis not present

## 2018-03-05 MED ORDER — BUPROPION HCL ER (XL) 150 MG PO TB24: 150.0000 mg | ORAL_TABLET | Freq: Every day | ORAL | 0 refills | Status: DC

## 2018-03-05 MED FILL — buPROPion HCL ER (XL) 150 M: 150 | 30 days supply | Qty: 30 | Fill #0

## 2018-03-05 NOTE — Patient Instructions (Addendum)
Your blood pressure was mildly elevated initially but on recheck it was less than 140/90.  Still would recommend that you check your blood pressure occasionally and confirm trend is not above 140/90.  If so then would recommend a blood pressure medication.  I have a very glad to see that you had your surgery and and that you are doing overall relatively well with your recovery process.  I still would recommend that you not do any excessive exercise or until we see how you progress.  For history of some atherosclerosis found on CT imaging despite good lipid panel, I do recommend that you consider low dose atorvastatin.  Since you are willing to try Wellbutrin to stop smoking I think we can wait a couple months to see if he can actually stop smoking.  If so then would not place use of statin.  Regarding your request about potentially Cologuard instead of colonoscopy, I want you to check with your family members to make sure no history of colon cancer, adenomas or family history of polyps.   Follow-up in 2  Months or as needed.

## 2018-03-05 NOTE — Progress Notes (Signed)
Subjective:    Patient ID: Bryan Lane., male    DOB: 1960/11/02, 57 y.o.   MRN: [id number]  HPI  Pt in for follow up. He had ANTERIOR CERVICAL DECOMPRESSION/DISCECTOMY FUSION in recent past.  Pt just followed up with Dr. Dimas Aguas last Friday.  Pt states he has much better range of motion range of arms Leg. Better strength in hands/grip. But he states he feels numbness of his hands.  Pt states state his grip strength slight decreased and his hands feel slight numb.   Pt states he states he feels tired easily.   Pt expresses that he never had neck pain preceding all of his symptoms before neck etiology found. Pt states symptoms of both upper and lower ext started as he stopped his work right before he went on [employer].   He did report some remote pain in neck  years ago in the [employer].   On review I had placed order for pt to see GI. But he delayed that due to needing neurosurgery and he is interested in getting possible cologuard instead of colonoscopy.   Review of Systems  Constitutional: Negative for chills, fatigue and fever.  Respiratory: Negative for chest tightness, shortness of breath and wheezing.   Cardiovascular: Negative for chest pain and palpitations.  Gastrointestinal: Negative for abdominal pain, constipation, diarrhea, rectal pain and vomiting.  Musculoskeletal: Negative for back pain and joint swelling.       No neck pain.  Some numbness to hands(hand feel cold and clammy)  He reports  he still feels off balance when he walks. But improved compared to before.  Skin: Negative for rash.  Neurological: Negative for dizziness, facial asymmetry, light-headedness, numbness and headaches.  Hematological: Negative for adenopathy. Does not bruise/bleed easily.  Psychiatric/Behavioral: Negative for behavioral problems, confusion, dysphoric mood, hallucinations and sleep disturbance. The patient is not nervous/anxious.    Past Medical History:    Diagnosis Date  . Bowel incontinence    due to cervical disc issue  . Depression    with pain  . Dyspnea    with  exertion and pain  . Hypertension    2 years ago was on med for bp. hctz.  Marland Kitchen PTSD (post-traumatic stress disorder)   . Urine incontinence 01/2018   due to cervical issue     Social History   Socioeconomic History  . Marital status: Single    Spouse name: Not on file  . Number of children: 2  . Years of education: 16  . Highest education level: Bachelor's degree (e.g., BA, AB, BS)  Occupational History  . Occupation: applying for disability  Social Needs  . Financial resource strain: Not on file  . Food insecurity:    Worry: Not on file    Inability: Not on file  . Transportation needs:    Medical: Not on file    Non-medical: Not on file  Tobacco Use  . Smoking status: Current Every Day Smoker    Packs/day: 0.50    Years: 40.00    Pack years: 20.00  . Smokeless tobacco: Never Used  Substance and Sexual Activity  . Alcohol use: Not Currently  . Drug use: Yes    Types: Marijuana    Comment: last 01/28/18   . Sexual activity: Not Currently  Lifestyle  . Physical activity:    Days per week: Not on file    Minutes per session: Not on file  . Stress: Not on file  Relationships  .  Social connections:    Talks on phone: Not on file    Gets together: Not on file    Attends religious service: Not on file    Active member of club or organization: Not on file    Attends meetings of clubs or organizations: Not on file    Relationship status: Not on file  . Intimate partner violence:    Fear of current or ex partner: Not on file    Emotionally abused: Not on file    Physically abused: Not on file    Forced sexual activity: Not on file  Other Topics Concern  . Not on file  Social History Narrative   Lives with dad and stepmother in a one story home.  Has 2 children.     Applying for disability.  Education: BS    Past Surgical History:  Procedure  Laterality Date  . ANTERIOR CERVICAL DECOMPRESSION/DISCECTOMY FUSION 4 LEVELS N/A 01/31/2018   Procedure: ANTERIOR CERVICAL DECOMPRESSION/DISCECTOMY FUSION, INTERBODY PROSTHESIS, PLATE/SCREWS CERVICAL THREE- CERVICAL FOUR, CERVICAL FOUR - CERVICAL FIVE, CERVICAL FIVE - CERVICAL SIX, CERVICAL SIX- CERVICAL SEVEN;  Surgeon: Newman Pies, MD;  Location: [facility];  Service: Neurosurgery;  Laterality: N/A;  ANTERIOR CERVICAL DECOMPRESSION/DISCECTOMY FUSION, INTERBODY PROSTHESIS, PLATE/SCREWS CER  . HAND SURGERY Right    "BB removal"  . SHOULDER SURGERY Left 2017   acl repair  . Wisdom teeth removal      Family History  Problem Relation Age of Onset  . Diabetes Father   . Heart attack Mother   . Alcoholism Mother   . Hypertension Sister     No Known Allergies  No current outpatient medications on file prior to visit.   No current facility-administered medications on file prior to visit.     BP (!) 144/91   Pulse 97   Temp 98.2 F (36.8 C) (Oral)   Resp 16   Ht 6\' 2"  (1.88 m)   Wt 206 lb 6.4 oz (93.6 kg)   SpO2 100%   BMI 26.50 kg/m       Objective:   Physical Exam  General Mental Status- Alert. General Appearance- Not in acute distress.   Skin General: Color- Normal Color. Moisture- Normal Moisture.  Neck Carotid Arteries- Normal color. Moisture- Normal Moisture. No carotid bruits. No JVD.  Chest and Lung Exam Auscultation: Breath Sounds:-Normal.  Cardiovascular Auscultation:Rythm- Regular. Murmurs & Other Heart Sounds:Auscultation of the heart reveals- No Murmurs.  Abdomen Inspection:-Inspeection Normal. Palpation/Percussion:Note:No mass. Palpation and Percussion of the abdomen reveal- Non Tender, Non Distended + BS, no rebound or guarding.  Neurologic Cranial Nerve exam:- CN III-XII intact(No nystagmus), symmetric smile. Strength:- 5/5 equal and symmetric strength both upper and lower extremities.  Hands- sharp and dull discrimination decreased on rt 3rd  digit. Other digits can discriminate But he states on day to day feel decreased sensation.  Gross movement of upper ext and lower ext appears mild uncoordinated.         Assessment & Plan:  Your blood pressure was mildly elevated initially but on recheck it was less than 140/90.  Still would recommend that you check your blood pressure occasionally and confirm trend is not above 140/90.  If so then would recommend a blood pressure medication.  I have a very glad to see that you had your surgery and and that you are doing overall relatively well with your recovery process.  I still would recommend that you not do any excessive exercise or until we see how you progress.  For history of some atherosclerosis found on CT imaging despite good lipid panel, I do recommend that you consider low dose atorvastatin.  Since you are willing to try Wellbutrin to stop smoking I think we can wait a couple months to see if he can actually stop smoking.  If so then would not place use of statin.  Regarding your request about potentially Cologuard instead of colonoscopy, I want you to check with your family members to make sure no history of colon cancer, adenomas or family history of polyps.   Follow-up in 2  Months or as needed.  25 minutes spent with pt today. 50% of time spent counseling patient his degree how he has been doing since the his surgery. How is recovery process is going. Also counseled counseled on cologuard vs colonscopy. As well as importance of stopping smoking.   Mackie Pai, PA-C

## 2018-03-12 ENCOUNTER — Encounter: Admitting: Gastroenterology

## 2018-03-12 ENCOUNTER — Encounter: Payer: Self-pay | Admitting: Medical

## 2018-03-12 ENCOUNTER — Encounter: Payer: Self-pay | Admitting: Gastroenterology

## 2018-04-04 ENCOUNTER — Ambulatory Visit (AMBULATORY_SURGERY_CENTER): Payer: Self-pay

## 2018-04-04 VITALS — Ht 74.0 in | Wt 207.0 lb

## 2018-04-04 DIAGNOSIS — Z1211 Encounter for screening for malignant neoplasm of colon: Secondary | ICD-10-CM

## 2018-04-04 MED ORDER — NA SULFATE-K SULFATE-MG SULF 17.5-3.13-1.6 GM/177ML PO SOLN: 1.0000 | Freq: Once | ORAL | 0 refills | Status: AC

## 2018-04-04 NOTE — Progress Notes (Signed)
Denies allergies to eggs or soy products. Denies complication of anesthesia or sedation. Denies use of weight loss medication. Denies use of O2.   Emmi instructions declined.  

## 2018-04-17 ENCOUNTER — Ambulatory Visit (AMBULATORY_SURGERY_CENTER): Admitting: Gastroenterology

## 2018-04-17 ENCOUNTER — Encounter: Payer: Self-pay | Admitting: Gastroenterology

## 2018-04-17 VITALS — BP 112/76 | HR 77 | Temp 98.2°F | Resp 13 | Ht 74.0 in | Wt 207.0 lb

## 2018-04-17 DIAGNOSIS — Z1211 Encounter for screening for malignant neoplasm of colon: Secondary | ICD-10-CM | POA: Diagnosis present

## 2018-04-17 DIAGNOSIS — K5730 Diverticulosis of large intestine without perforation or abscess without bleeding: Secondary | ICD-10-CM

## 2018-04-17 DIAGNOSIS — D122 Benign neoplasm of ascending colon: Secondary | ICD-10-CM

## 2018-04-17 DIAGNOSIS — D128 Benign neoplasm of rectum: Secondary | ICD-10-CM

## 2018-04-17 DIAGNOSIS — K621 Rectal polyp: Secondary | ICD-10-CM

## 2018-04-17 MED ORDER — SODIUM CHLORIDE 0.9 % IV SOLN: 500.0000 mL | Freq: Once | INTRAVENOUS | Status: DC

## 2018-04-17 NOTE — Patient Instructions (Signed)
HANDOUTS GIVEN FOR POLYPS AND DIVERTICULOSIS  YOU HAD AN ENDOSCOPIC PROCEDURE TODAY AT [facility]:   Refer to the procedure report that was given to you for any specific questions about what was found during the examination.  If the procedure report does not answer your questions, please call your gastroenterologist to clarify.  If you requested that your care partner not be given the details of your procedure findings, then the procedure report has been included in a sealed envelope for you to review at your convenience later.  YOU SHOULD EXPECT: Some feelings of bloating in the abdomen. Passage of more gas than usual.  Walking can help get rid of the air that was put into your GI tract during the procedure and reduce the bloating. If you had a lower endoscopy (such as a colonoscopy or flexible sigmoidoscopy) you may notice spotting of blood in your stool or on the toilet paper. If you underwent a bowel prep for your procedure, you may not have a normal bowel movement for a few days.  Please Note:  You might notice some irritation and congestion in your nose or some drainage.  This is from the oxygen used during your procedure.  There is no need for concern and it should clear up in a day or so.  SYMPTOMS TO REPORT IMMEDIATELY:   Following lower endoscopy (colonoscopy or flexible sigmoidoscopy):  Excessive amounts of blood in the stool  Significant tenderness or worsening of abdominal pains  Swelling of the abdomen that is new, acute  Fever of 100F or higher  For urgent or emergent issues, a gastroenterologist can be reached at any hour by calling [phone].   DIET:  We do recommend a small meal at first, but then you may proceed to your regular diet.  Drink plenty of fluids but you should avoid alcoholic beverages for 24 hours.  ACTIVITY:  You should plan to take it easy for the rest of today and you should NOT DRIVE or use heavy machinery until tomorrow (because of  the sedation medicines used during the test).    FOLLOW UP: Our staff will call the number listed on your records the next business day following your procedure to check on you and address any questions or concerns that you may have regarding the information given to you following your procedure. If we do not reach you, we will leave a message.  However, if you are feeling well and you are not experiencing any problems, there is no need to return our call.  We will assume that you have returned to your regular daily activities without incident.  If any biopsies were taken you will be contacted by phone or by letter within the next 1-3 weeks.  Please call us at [phone] if you have not heard about the biopsies in 3 weeks.    SIGNATURES/CONFIDENTIALITY: You and/or your care partner have signed paperwork which will be entered into your electronic medical record.  These signatures attest to the fact that that the information above on your After Visit Summary has been reviewed and is understood.  Full responsibility of the confidentiality of this discharge information lies with you and/or your care-partner.

## 2018-04-17 NOTE — Progress Notes (Signed)
PT taken to PACU. Monitors in place. VSS. Report given to RN. 

## 2018-04-17 NOTE — Progress Notes (Signed)
Called to room to assist during endoscopic procedure.  Patient ID and intended procedure confirmed with present staff. Received instructions for my participation in the procedure from the performing physician.  

## 2018-04-17 NOTE — Op Note (Signed)
[facility] Patient Name: Bryan Lane Procedure Date: 04/17/2018 1:20 PM MRN: [id number] Endoscopist: Gerrit Heck , MD Age: 57 Referring MD:  Date of Birth: [birth year] Gender: Male Account #: [id number] Procedure:                Colonoscopy Indications:              Screening for colorectal malignant neoplasm, This                            is the patient's first colonoscopy Medicines:                Monitored Anesthesia Care Procedure:                Pre-Anesthesia Assessment:                           - Prior to the procedure, a History and Physical                            was performed, and patient medications and                            allergies were reviewed. The patient's tolerance of                            previous anesthesia was also reviewed. The risks                            and benefits of the procedure and the sedation                            options and risks were discussed with the patient.                            All questions were answered, and informed consent                            was obtained. Prior Anticoagulants: The patient has                            taken no previous anticoagulant or antiplatelet                            agents. ASA Grade Assessment: II - A patient with                            mild systemic disease. After reviewing the risks                            and benefits, the patient was deemed in                            satisfactory condition to undergo the procedure.  After obtaining informed consent, the colonoscope                            was passed under direct vision. Throughout the                            procedure, the patient's blood pressure, pulse, and                            oxygen saturations were monitored continuously. The                            Colonoscope was introduced through the anus and                            advanced to the the  terminal ileum. The colonoscopy                            was performed without difficulty. The patient                            tolerated the procedure well. The quality of the                            bowel preparation was adequate. Scope In: 1:40:21 PM Scope Out: 2:00:35 PM Scope Withdrawal Time: 0 hours 17 minutes 6 seconds  Total Procedure Duration: 0 hours 20 minutes 14 seconds  Findings:                 The perianal and digital rectal examinations were                            normal.                           Three sessile polyps were found in the ascending                            colon. The polyps were 3 to 4 mm in size. These                            polyps were removed with a cold snare. Resection                            and retrieval were complete. Estimated blood loss                            was minimal.                           A 2 mm polyp was found in the ascending colon. The                            polyp was sessile. The polyp was removed with a  cold biopsy forceps. Resection and retrieval were                            complete. Estimated blood loss was minimal.                           A 3 mm polyp was found in the rectum. The polyp was                            sessile. The polyp was removed with a cold snare.                            Resection and retrieval were complete. Estimated                            blood loss was minimal.                           Multiple small-mouthed diverticula were found in                            the sigmoid colon and ascending colon.                           Retroflexion in the right colon was performed.                           The retroflexed view of the distal rectum and anal                            verge was normal and showed no anal or rectal                            abnormalities.                           The terminal ileum appeared normal. Complications:             No immediate complications. Estimated Blood Loss:     Estimated blood loss was minimal. Impression:               - Three 3 to 4 mm polyps in the ascending colon,                            removed with a cold snare. Resected and retrieved.                           - One 2 mm polyp in the ascending colon, removed                            with a cold biopsy forceps. Resected and retrieved.                           - One 3 mm polyp in the rectum,  removed with a cold                            snare. Resected and retrieved.                           - Diverticulosis in the sigmoid colon and in the                            ascending colon.                           - The distal rectum and anal verge are normal on                            retroflexion view.                           - The examined portion of the ileum was normal. Recommendation:           - Patient has a contact number available for                            emergencies. The signs and symptoms of potential                            delayed complications were discussed with the                            patient. Return to normal activities tomorrow.                            Written discharge instructions were provided to the                            patient.                           - Resume previous diet today.                           - Continue present medications.                           - Await pathology results.                           - Repeat colonoscopy in 3 - 5 years for                            surveillance based on pathology results.                           - Return to GI office PRN. Gerrit Heck, MD 04/17/2018 2:04:33 PM

## 2018-04-17 NOTE — Progress Notes (Signed)
Pt's states no medical or surgical changes since previsit or office visit. 

## 2018-04-18 ENCOUNTER — Telehealth: Payer: Self-pay | Admitting: *Deleted

## 2018-04-18 ENCOUNTER — Telehealth: Payer: Self-pay

## 2018-04-18 NOTE — Telephone Encounter (Signed)
  Follow up Call-  Call back number 04/17/2018  Post procedure Call Back phone  # [phone]  Permission to leave phone message Yes     Patient questions:  Do you have a fever, pain , or abdominal swelling? No. Pain Score  0 *  Have you tolerated food without any problems? Yes.    Have you been able to return to your normal activities? Yes.    Do you have any questions about your discharge instructions: Diet   No. Medications  No. Follow up visit  No.  Do you have questions or concerns about your Care? No.  Actions: * If pain score is 4 or above: No action needed, pain <4.

## 2018-04-18 NOTE — Telephone Encounter (Signed)
Attempted to reach pt. With follow-up call following endoscopic procedure 04/17/2018.  LM on pt. Voice mail.  Will try to reach pt. Again later today.

## 2018-04-18 NOTE — Telephone Encounter (Signed)
Pt returned your call stating that he is doing fine.  °

## 2018-04-23 ENCOUNTER — Encounter: Payer: Self-pay | Admitting: Gastroenterology

## 2018-05-15 ENCOUNTER — Encounter: Payer: Self-pay | Admitting: Medical

## 2018-05-15 ENCOUNTER — Telehealth: Payer: Self-pay | Admitting: *Deleted

## 2018-05-15 NOTE — Telephone Encounter (Signed)
Received request for Medical Records from [place], [place]; forwarded to Medical Records via email/scan/SLS 11/27

## 2018-05-23 ENCOUNTER — Ambulatory Visit (INDEPENDENT_AMBULATORY_CARE_PROVIDER_SITE_OTHER): Admitting: Medical

## 2018-05-23 ENCOUNTER — Encounter: Payer: Self-pay | Admitting: Medical

## 2018-05-23 VITALS — BP 130/87 | HR 84 | Temp 97.8°F | Resp 16 | Ht 76.0 in | Wt 225.0 lb

## 2018-05-23 DIAGNOSIS — R202 Paresthesia of skin: Secondary | ICD-10-CM

## 2018-05-23 DIAGNOSIS — Z9889 Other specified postprocedural states: Secondary | ICD-10-CM

## 2018-05-23 DIAGNOSIS — R29898 Other symptoms and signs involving the musculoskeletal system: Secondary | ICD-10-CM | POA: Diagnosis not present

## 2018-05-23 DIAGNOSIS — M79602 Pain in left arm: Secondary | ICD-10-CM

## 2018-05-23 DIAGNOSIS — M62838 Other muscle spasm: Secondary | ICD-10-CM | POA: Diagnosis not present

## 2018-05-23 DIAGNOSIS — M79601 Pain in right arm: Secondary | ICD-10-CM

## 2018-05-23 DIAGNOSIS — F17200 Nicotine dependence, unspecified, uncomplicated: Secondary | ICD-10-CM

## 2018-05-23 MED ORDER — BACLOFEN 10 MG PO TABS: 10.0000 mg | ORAL_TABLET | Freq: Three times a day (TID) | ORAL | 0 refills | Status: DC

## 2018-05-23 MED ORDER — NICOTINE 21 MG/24HR TD PT24
21.0000 mg | MEDICATED_PATCH | Freq: Every day | TRANSDERMAL | 0 refills | Status: DC
Start: 2018-05-23 — End: 2019-02-07

## 2018-05-23 MED FILL — BACLOFEN 10 MG TABS: 10 | 10 days supply | Qty: 30 | Fill #0

## 2018-05-23 NOTE — Addendum Note (Signed)
Addended by: Anabel Halon on: 05/23/2018 08:10 PM   Modules accepted: Level of Service

## 2018-05-23 NOTE — Progress Notes (Signed)
Subjective:    Patient ID: Bryan Lane., male    DOB: 03-Dec-1960, 57 y.o.   MRN: [id number]  HPI Pt in stating he feels some proximal muscle weakness to both upper and lower ext. He states he has very poor endurance of both upper and lower upper extremity. He also describes that he tries to stretch he reports that muscle will spasm and contract.   He states his muscles will spasm when he brushes leg against things.  He explains his stride is shorter than it used to be. He states about a month after surgery he had improvement but then after about one month symptoms/signs started to worsen.  Pt is going to see neurosurgeon next week on 05/28/2018. He had neck surgery in past. He report improvement of symptoms initially then regression as before.  Pt has tried to stop smoking cigarettes but can't. He also admits smoking marijuana.   Pt states disability was disaproved. Now going to get lawyer to help with disability  Pt explains to me today that had onset of symptoms before he was walking on [place].    Review of Systems  Constitutional: Negative for chills, fatigue and fever.  Respiratory: Negative for cough, chest tightness, shortness of breath and wheezing.   Cardiovascular: Negative for chest pain and palpitations.  Gastrointestinal: Negative for abdominal pain.  Musculoskeletal:       See hpi.  Neurological: Negative for dizziness and headaches.       See hpi.  Hematological: Negative for adenopathy. Does not bruise/bleed easily.    Past Medical History:  Diagnosis Date  . Bowel incontinence    due to cervical disc issue  . Depression    with pain  . Dyspnea    with  exertion and pain  . Hyperlipidemia   . Hypertension    2 years ago was on med for bp. hctz.  . Neuromuscular disorder ([place])    Spinal cord injury  . PTSD (post-traumatic stress disorder)   . Urine incontinence 01/2018   due to cervical issue     Social History   Socioeconomic History    . Marital status: Single    Spouse name: Not on file  . Number of children: 2  . Years of education: 16  . Highest education level: Bachelor's degree (e.g., BA, AB, BS)  Occupational History  . Occupation: applying for disability  Social Needs  . Financial resource strain: Not on file  . Food insecurity:    Worry: Not on file    Inability: Not on file  . Transportation needs:    Medical: Not on file    Non-medical: Not on file  Tobacco Use  . Smoking status: Current Every Day Smoker    Packs/day: 0.50    Years: 40.00    Pack years: 20.00  . Smokeless tobacco: Never Used  Substance and Sexual Activity  . Alcohol use: Yes    Comment: Socially  . Drug use: Yes    Types: Marijuana    Comment: last 01/28/18   . Sexual activity: Not Currently  Lifestyle  . Physical activity:    Days per week: Not on file    Minutes per session: Not on file  . Stress: Not on file  Relationships  . Social connections:    Talks on phone: Not on file    Gets together: Not on file    Attends religious service: Not on file    Active member of club or organization:  Not on file    Attends meetings of clubs or organizations: Not on file    Relationship status: Not on file  . Intimate partner violence:    Fear of current or ex partner: Not on file    Emotionally abused: Not on file    Physically abused: Not on file    Forced sexual activity: Not on file  Other Topics Concern  . Not on file  Social History Narrative   Lives with dad and stepmother in a one story home.  Has 2 children.     Applying for disability.  Education: BS    Past Surgical History:  Procedure Laterality Date  . ANTERIOR CERVICAL DECOMPRESSION/DISCECTOMY FUSION 4 LEVELS N/A 01/31/2018   Procedure: ANTERIOR CERVICAL DECOMPRESSION/DISCECTOMY FUSION, INTERBODY PROSTHESIS, PLATE/SCREWS CERVICAL THREE- CERVICAL FOUR, CERVICAL FOUR - CERVICAL FIVE, CERVICAL FIVE - CERVICAL SIX, CERVICAL SIX- CERVICAL SEVEN;  Surgeon: Newman Pies, MD;  Location: [facility];  Service: Neurosurgery;  Laterality: N/A;  ANTERIOR CERVICAL DECOMPRESSION/DISCECTOMY FUSION, INTERBODY PROSTHESIS, PLATE/SCREWS CER  . HAND SURGERY Right    "BB removal"  . SHOULDER SURGERY Left 2017   acl repair  . Wisdom teeth removal      Family History  Problem Relation Age of Onset  . Diabetes Father   . Colon polyps Father   . Heart attack Mother   . Alcoholism Mother   . Hypertension Sister   . Colon cancer Neg Hx   . Esophageal cancer Neg Hx   . Rectal cancer Neg Hx   . Stomach cancer Neg Hx     No Known Allergies  Current Outpatient Medications on File Prior to Visit  Medication Sig Dispense Refill  . acetaminophen (TYLENOL) 500 MG tablet Take 500 mg by mouth every 6 (six) hours as needed.    . cyclobenzaprine (FLEXERIL) 10 MG tablet Take 10 mg by mouth 3 (three) times daily as needed for muscle spasms.     No current facility-administered medications on file prior to visit.     BP (!) 153/85   Pulse 84 Comment: 84  Temp 97.8 F (36.6 C) (Oral)   Resp 16   Ht 6\' 4"  (1.93 m)   Wt 225 lb (102.1 kg)   SpO2 100%   BMI 27.39 kg/m       Objective:   Physical Exam   General Mental Status- Alert. General Appearance- Not in acute distress.   Skin General: Color- Normal Color. Moisture- Normal Moisture.  Neck Carotid Arteries- Normal color. Moisture- Normal Moisture. No carotid bruits. No JVD.  Chest and Lung Exam Auscultation: Breath Sounds:-Normal.  Cardiovascular Auscultation:Rythm- Regular. Murmurs & Other Heart Sounds:Auscultation of the heart reveals- No Murmurs.  Abdomen Inspection:-Inspeection Normal. Palpation/Percussion:Note:No mass. Palpation and Percussion of the abdomen reveal- Non Tender, Non Distended + BS, no rebound or guarding.   Neurologic Cranial Nerve exam:- CN III-XII intact(No nystagmus), symmetric smile. Drift Test:- No drift. Heal to Toe Gait exam:-gait shortened due to muscle weakness and  spasticity. Finger to Nose:- Normal/Intact Strength:- 4/5 equal and symmetric strength both upper and lower extremities. Upper and lower ext- uncordinated.      Assessment & Plan:  For your muscles spasm experiencing will prescribe baclofen 10 mg dose. Sedation is side effect.  You have upcoming appointment with neurosurgeon. I think they might repeat mri or get ct of your neck. Please have them send me office note and copy of radiology read of imaging studies.  Will review those notes and may get you  back in with neurologist to get opinion on treating spasms and other symptoms.  To stop smoking rx'd nicotine patches.  Follow up in 3 weeks or as needed  40 minutes spent with pt. 50% of time spent counseling/discussing on treatment today and plans going forward in terms of getting neurosurgeon follow up notes and possible/probable  referral back to his neurologist to help with treatment of current symptoms depending on neurosurgeon opinion. Asked him to give my my chart summary on Tuesday after specialist visit and update me on how he is doing with baclofen.  Mackie Pai, PA-C

## 2018-05-23 NOTE — Patient Instructions (Addendum)
For your muscles spasm experiencing will prescribe baclofen 10 mg dose. Sedation is side effect.  You have upcoming appointment with neurosurgeon. I think they might repeat mri or get ct of your neck. Please have them send me office note and copy of radiology read of imaging studies.  Will review those notes and may get you back in with neurologist to get opinion on treating spasms and other symptoms.  To stop smoking rx'd nicotine patches.  Follow up in 3 weeks or as needed

## 2018-06-03 ENCOUNTER — Other Ambulatory Visit (HOSPITAL_BASED_OUTPATIENT_CLINIC_OR_DEPARTMENT_OTHER): Payer: Self-pay | Admitting: Neurosurgery

## 2018-06-03 DIAGNOSIS — G959 Disease of spinal cord, unspecified: Secondary | ICD-10-CM

## 2018-06-04 ENCOUNTER — Telehealth: Payer: Self-pay | Admitting: Medical

## 2018-06-04 ENCOUNTER — Encounter: Payer: Self-pay | Admitting: Medical

## 2018-06-04 MED ORDER — BACLOFEN 10 MG PO TABS: 10.0000 mg | ORAL_TABLET | Freq: Three times a day (TID) | ORAL | 0 refills | Status: DC

## 2018-06-04 NOTE — Telephone Encounter (Signed)
Rx baclofen sent to pt pharmacy. °

## 2018-06-05 ENCOUNTER — Telehealth: Payer: Self-pay | Admitting: Medical

## 2018-06-05 MED ORDER — BACLOFEN 10 MG PO TABS: 10.0000 mg | ORAL_TABLET | Freq: Three times a day (TID) | ORAL | 0 refills | Status: DC

## 2018-06-05 NOTE — Telephone Encounter (Signed)
Rx baclofen sent to pt pharmacy[facility].

## 2018-06-08 ENCOUNTER — Ambulatory Visit (HOSPITAL_BASED_OUTPATIENT_CLINIC_OR_DEPARTMENT_OTHER)
Admission: RE | Admit: 2018-06-08 | Discharge: 2018-06-08 | Disposition: A | Discharge status: Home / Self Care | Source: Ambulatory Visit | Attending: Neurosurgery | Admitting: Neurosurgery

## 2018-06-08 DIAGNOSIS — G959 Disease of spinal cord, unspecified: Secondary | ICD-10-CM | POA: Diagnosis present

## 2018-07-02 NOTE — Telephone Encounter (Signed)
error 

## 2018-07-05 ENCOUNTER — Other Ambulatory Visit: Payer: Self-pay | Admitting: Medical

## 2018-07-10 ENCOUNTER — Telehealth: Payer: Self-pay | Admitting: Medical

## 2018-07-10 MED ORDER — BACLOFEN 10 MG PO TABS: ORAL_TABLET | ORAL | 0 refills | Status: DC

## 2018-07-10 NOTE — Telephone Encounter (Signed)
Rx baclofen sent to pt pharmacy. Please notify pt. Sorry for delay. Was in epic 2 days ago and somehow deleted the request that was sent to me.

## 2018-08-12 ENCOUNTER — Telehealth: Payer: Self-pay | Admitting: Medical

## 2018-08-12 NOTE — Telephone Encounter (Signed)
Patient came into the office to inform us that he needs a prescription refill of Baclofen 10 MG sent to [facility]. Please call with any questions.

## 2018-08-13 MED ORDER — BACLOFEN 10 MG PO TABS: ORAL_TABLET | ORAL | 0 refills | Status: DC

## 2018-08-13 NOTE — Telephone Encounter (Signed)
Rx sent to pharmacy   

## 2018-09-10 ENCOUNTER — Telehealth: Payer: Self-pay | Admitting: Medical

## 2018-09-10 ENCOUNTER — Encounter: Payer: Self-pay | Admitting: Medical

## 2018-09-10 MED ORDER — BACLOFEN 10 MG PO TABS: ORAL_TABLET | ORAL | 0 refills | Status: DC

## 2018-09-10 NOTE — Telephone Encounter (Signed)
Rx baclofen sent to pt pharmacy. °

## 2018-09-11 ENCOUNTER — Telehealth: Payer: Self-pay | Admitting: Medical

## 2018-09-11 MED ORDER — BACLOFEN 10 MG PO TABS: ORAL_TABLET | ORAL | 0 refills | Status: DC

## 2018-09-11 NOTE — Telephone Encounter (Signed)
Rx refill of baclofen sent to patient pharmacy [facility]. Will you call downstairs and cancel rx at [facility]

## 2018-09-11 NOTE — Telephone Encounter (Signed)
Patient would like a call to notify him when the script at [facility] is cancelled.

## 2018-09-12 NOTE — Telephone Encounter (Signed)
Canceled rx. Pt notified.

## 2018-10-11 ENCOUNTER — Ambulatory Visit (INDEPENDENT_AMBULATORY_CARE_PROVIDER_SITE_OTHER): Admitting: Medical

## 2018-10-11 ENCOUNTER — Encounter: Payer: Self-pay | Admitting: Medical

## 2018-10-11 ENCOUNTER — Other Ambulatory Visit: Payer: Self-pay

## 2018-10-11 DIAGNOSIS — L089 Local infection of the skin and subcutaneous tissue, unspecified: Secondary | ICD-10-CM | POA: Diagnosis not present

## 2018-10-11 MED ORDER — DOXYCYCLINE HYCLATE 100 MG PO TABS: 100.0000 mg | ORAL_TABLET | Freq: Two times a day (BID) | ORAL | 0 refills | Status: DC

## 2018-10-11 NOTE — Progress Notes (Signed)
   Subjective:    Patient ID: Bryan Lane., male    DOB: 1961-02-12, 58 y.o.   MRN: [id number]  HPI  Virtual Visit via Video Note  I connected with Bryan Lane. on 10/11/18 at  9:00 AM EDT by a video enabled telemedicine application and verified that I am speaking with the correct person using two identifiers.   I discussed the limitations of evaluation and management by telemedicine and the availability of in person appointments. The patient expressed understanding and agreed to proceed.  History of Present Illness:  Pt has irritated area right occipital region just above the hairline.  Present for about 5 days.  Initially he states there were small bumps were slightly irritated and minimally tender.  Then about 3 days ago he tried to squeeze the area after cutting grass and area became more tender and more swollen.  Describes area as mildly indurated but not with a soft center.  No fevers chills or sweats.    Observations/Objective: General no acute distress. Skin- on inspection of the area I really cannot appreciate any obvious swelling.  He palpates the area and states that it is mildly tender to palpation.  Warm to touch and indurated.  No fluctuance reported.  Assessment and Plan: By history appears that you had initial folliculitis followed by skin infection in region.  No abscess described presently.  Will go ahead and prescribe doxycycline oral antibiotic.  Rx advisement given.  If the area worsens as described then recommend ED evaluation.  Particularly if start to form an abscess that might need to be drained.  Can do warm compresses twice daily.  Asked to update me by my chart on this coming Tuesday or sooner if needed.  Follow Up Instructions:    I discussed the assessment and treatment plan with the patient. The patient was provided an opportunity to ask questions and all were answered. The patient agreed with the plan and demonstrated an understanding of the  instructions.   The patient was advised to call back or seek an in-person evaluation if the symptoms worsen or if the condition fails to improve as anticipated.    Mackie Pai, PA-C    Review of Systems See hpi    Objective:   Physical Exam   See objective.     Assessment & Plan:

## 2018-10-11 NOTE — Progress Notes (Signed)
   Subjective:    Patient ID: Bryan Lane., male    DOB: October 12, 1960, 58 y.o.   MRN: [id number]  HPI Virtual Visit via Video Note  I connected with Bryan Lane. on 10/11/18 at  9:00 AM EDT by a video enabled telemedicine application and verified that I am speaking with the correct person using two identifiers.   I discussed the limitations of evaluation and management by telemedicine and the availability of in person appointments. The patient expressed understanding and agreed to proceed.  History of Present Illness: Pt had about 5 days of irritated skin area back of occipatal area at junciton of hairline/neck area. Originally small bumps and mild irriated. He scratch and pick and area. Then squeezed the area and got tender. Now area hurts daily for past 3 days. He is applying vinegar to area but not decreasing. No fever,no chills or sweats. Skin feels hard but no soft center when he feels.   Observations/Objective: No acute distress. Could not visualize are well since in hairline. Pt feels area and states swollen, indurated and mild warm. No fluctuance,.  Assessment and Plan: Apply warm compress twice daily to infected area. Appears to have been inflamed follicles with skin infection now. Rx doxycycline antibiotic. rx advisement. If forms abscess/worse pain or symptoms then ED evaluation. Ask give me update by my chart on Tuesday next week.  Follow Up Instructions:    I discussed the assessment and treatment plan with the patient. The patient was provided an opportunity to ask questions and all were answered. The patient agreed with the plan and demonstrated an understanding of the instructions.   The patient was advised to call back or seek an in-person evaluation if the symptoms worsen or if the condition fails to improve as anticipated.     Mackie Pai, PA-C    Review of Systems See hpi    Objective:   Physical Exam See objective.       Assessment &  Plan:

## 2018-10-11 NOTE — Patient Instructions (Addendum)
By history appears that you had initial folliculitis followed by skin infection in region.  No abscess described presently.  Will go ahead and prescribe doxycycline oral antibiotic.  Rx advisement given.  If the area worsens as described then recommend ED evaluation.  Particularly if start to form an abscess that might need to be drained.  Can do warm compresses twice daily.  Asked to update me by my chart on this coming Tuesday or sooner if needed.

## 2018-10-17 ENCOUNTER — Other Ambulatory Visit: Payer: Self-pay | Admitting: Medical

## 2018-10-18 ENCOUNTER — Encounter: Payer: Self-pay | Admitting: Medical

## 2018-10-18 MED ORDER — BACLOFEN 10 MG PO TABS: ORAL_TABLET | ORAL | 3 refills | Status: DC

## 2018-10-18 NOTE — Telephone Encounter (Signed)
Rx Baclofen sent to pt pharmacy.

## 2018-10-21 ENCOUNTER — Telehealth: Payer: Self-pay | Admitting: Medical

## 2018-10-21 ENCOUNTER — Telehealth: Payer: Self-pay | Admitting: *Deleted

## 2018-10-21 MED ORDER — DOXYCYCLINE HYCLATE 100 MG PO TABS: 100.0000 mg | ORAL_TABLET | Freq: Two times a day (BID) | ORAL | 0 refills | Status: DC

## 2018-10-21 NOTE — Telephone Encounter (Signed)
Will you call pt and set him up for virtual visit follow up from last visit.

## 2018-10-21 NOTE — Telephone Encounter (Signed)
Received request for Medical Records from [facility]; forwarded to Medical Records via email/scan/SLS

## 2018-10-22 ENCOUNTER — Ambulatory Visit: Admitting: Medical

## 2018-10-22 ENCOUNTER — Other Ambulatory Visit: Payer: Self-pay

## 2018-10-22 MED ORDER — DOXYCYCLINE HYCLATE 100 MG PO TABS: 100.0000 mg | ORAL_TABLET | Freq: Two times a day (BID) | ORAL | 0 refills | Status: DC

## 2018-10-22 NOTE — Addendum Note (Signed)
Addended by: Damita Dunnings D on: 10/22/2018 11:15 AM   Modules accepted: Orders

## 2018-10-22 NOTE — Telephone Encounter (Signed)
[facility] message sent- awaiting response.

## 2018-11-25 ENCOUNTER — Encounter: Payer: Self-pay | Admitting: Medical

## 2018-12-02 ENCOUNTER — Encounter: Payer: Self-pay | Admitting: Medical

## 2018-12-03 ENCOUNTER — Encounter: Payer: Self-pay | Admitting: Medical

## 2018-12-03 ENCOUNTER — Ambulatory Visit (INDEPENDENT_AMBULATORY_CARE_PROVIDER_SITE_OTHER): Admitting: Medical

## 2018-12-03 ENCOUNTER — Other Ambulatory Visit: Payer: Self-pay

## 2018-12-03 VITALS — BP 123/70 | HR 73 | Ht 73.0 in | Wt 220.0 lb

## 2018-12-03 DIAGNOSIS — M5441 Lumbago with sciatica, right side: Secondary | ICD-10-CM | POA: Diagnosis not present

## 2018-12-03 DIAGNOSIS — M5442 Lumbago with sciatica, left side: Secondary | ICD-10-CM | POA: Diagnosis not present

## 2018-12-03 DIAGNOSIS — M5410 Radiculopathy, site unspecified: Secondary | ICD-10-CM

## 2018-12-03 MED ORDER — HYDROCODONE-ACETAMINOPHEN 5-325 MG PO TABS: 1.0000 | ORAL_TABLET | Freq: Four times a day (QID) | ORAL | 0 refills | Status: DC | PRN

## 2018-12-03 MED ORDER — GABAPENTIN 100 MG PO CAPS: 100.0000 mg | ORAL_CAPSULE | Freq: Every day | ORAL | 0 refills | Status: DC

## 2018-12-03 MED ORDER — PREDNISONE 5 MG (21) PO TBPK: ORAL_TABLET | ORAL | 0 refills | Status: DC

## 2018-12-03 NOTE — Progress Notes (Signed)
Subjective:    Patient ID: Bryan Lane., male    DOB: 11-22-1960, 58 y.o.   MRN: [id number]  HPI  Virtual Visit via Video Note  I connected with Bryan Lane. on 12/03/18 at 10:20 AM EDT by a video enabled telemedicine application and verified that I am speaking with the correct person using two identifiers.  Location: Patient: home Provider: home   I discussed the limitations of evaluation and management by telemedicine and the availability of in person appointments. The patient expressed understanding and agreed to proceed.  History of Present Illness:  Pt states he woke up with some severe back pain about 5 days ago. He states some sciatic nerve type pain down both legs. He states lumbar back pain all the way down to his legs/foot.   Pt taking 3 tylenol, 4 ibuprofen and 3 baclofen before he goes to sleep. He states will have 4 am due to pain. Last night hard to sleep in bed due to pain. Last night had to sleep in chair and use heating bad.   Today he woke up and put ice on the low back and symptoms improved. Alternative.  No bowel or urine incontinence.  Pt has some baseline upper and lower ext weaknes and stiffeness related to cspine history. But lower extremity does not have any acute changes in terms of less strength.  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  L3-4 and L4-5 show moderate disc bulges and mild facet and ligamentous hypertrophy with mild stenosis of both lateral recesses but no definite neural compression.  L5-S1 shows advanced bilateral facet arthropathy allowing 3 mm of anterolisthesis. There is shallow broad-based herniation of disc material with slight caudal down turning behind the superior endplate of S1. There is stenosis of the lateral recesses and foramina that could cause neural compression on either or both sides. Findings slightly worse on the right. 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    Observations/Objective: General-no acute distress, pleasant, oriented. Lungs- on inspection lungs appear unlabored. Neck- no tracheal deviation or jvd on inspection. Neuro- gross motor function appears intact.  Assessment and Plan:   Follow Up Instructions:    I discussed the assessment and treatment plan with the patient. The patient was provided an opportunity to ask questions and all were answered. The patient agreed with the plan and demonstrated an understanding of the instructions.   The patient was advised to call back or seek an in-person evaluation if the symptoms worsen or if the condition fails to improve as anticipated.  I provided 25 minutes of non-face-to-face time during this encounter.   Mackie Pai, PA-C    Review of Systems  Constitutional: Negative for chills, fatigue and fever.  Respiratory: Negative for cough, chest tightness, shortness of breath and wheezing.   Cardiovascular: Negative for chest pain and palpitations.  Gastrointestinal: Negative for abdominal distention, abdominal pain, nausea and vomiting.  Genitourinary: Negative for difficulty urinating, dysuria, enuresis, flank pain and frequency.  Musculoskeletal: Positive for back pain.  Neurological:       Radiating pain to both legs.  From back.   Past Medical History:  Diagnosis Date  . Bowel incontinence    due to cervical disc issue  . Depression    with pain  .  Dyspnea    with  exertion and pain  . Hyperlipidemia   . Hypertension    2 years ago was on med for bp. hctz.  . Neuromuscular disorder ([place])    Spinal cord injury  . PTSD (post-traumatic stress disorder)   . Urine incontinence 01/2018   due to cervical issue     Social History   Socioeconomic History  . Marital status: Single    Spouse name: Not on file  .  Number of children: 2  . Years of education: 16  . Highest education level: Bachelor's degree (e.g., BA, AB, BS)  Occupational History  . Occupation: applying for disability  Social Needs  . Financial resource strain: Not on file  . Food insecurity    Worry: Not on file    Inability: Not on file  . Transportation needs    Medical: Not on file    Non-medical: Not on file  Tobacco Use  . Smoking status: Current Every Day Smoker    Packs/day: 0.50    Years: 40.00    Pack years: 20.00  . Smokeless tobacco: Never Used  Substance and Sexual Activity  . Alcohol use: Yes    Comment: Socially  . Drug use: Yes    Types: Marijuana    Comment: last 01/28/18   . Sexual activity: Not Currently  Lifestyle  . Physical activity    Days per week: Not on file    Minutes per session: Not on file  . Stress: Not on file  Relationships  . Social Herbalist on phone: Not on file    Gets together: Not on file    Attends religious service: Not on file    Active member of club or organization: Not on file    Attends meetings of clubs or organizations: Not on file    Relationship status: Not on file  . Intimate partner violence    Fear of current or ex partner: Not on file    Emotionally abused: Not on file    Physically abused: Not on file    Forced sexual activity: Not on file  Other Topics Concern  . Not on file  Social History Narrative   Lives with dad and stepmother in a one story home.  Has 2 children.     Applying for disability.  Education: BS    Past Surgical History:  Procedure Laterality Date  . ANTERIOR CERVICAL DECOMPRESSION/DISCECTOMY FUSION 4 LEVELS N/A 01/31/2018   Procedure: ANTERIOR CERVICAL DECOMPRESSION/DISCECTOMY FUSION, INTERBODY PROSTHESIS, PLATE/SCREWS CERVICAL THREE- CERVICAL FOUR, CERVICAL FOUR - CERVICAL FIVE, CERVICAL FIVE - CERVICAL SIX, CERVICAL SIX- CERVICAL SEVEN;  Surgeon: Newman Pies, MD;  Location: [facility];  Service: Neurosurgery;  Laterality:  N/A;  ANTERIOR CERVICAL DECOMPRESSION/DISCECTOMY FUSION, INTERBODY PROSTHESIS, PLATE/SCREWS CER  . HAND SURGERY Right    "BB removal"  . SHOULDER SURGERY Left 2017   acl repair  . Wisdom teeth removal      Family History  Problem Relation Age of Onset  . Diabetes Father   . Colon polyps Father   . Heart attack Mother   . Alcoholism Mother   . Hypertension Sister   . Colon cancer Neg Hx   . Esophageal cancer Neg Hx   . Rectal cancer Neg Hx   . Stomach cancer Neg Hx     No Known Allergies  Current Outpatient Medications on File Prior to Visit  Medication Sig Dispense Refill  . acetaminophen (TYLENOL) 500 MG tablet Take  500 mg by mouth every 6 (six) hours as needed.    . baclofen (LIORESAL) 10 MG tablet TAKE ONE (1) TABLET BY MOUTH 3 TIMES DAILY(can take additional tab at night if needed) 120 each 3  . cyclobenzaprine (FLEXERIL) 10 MG tablet Take 10 mg by mouth 3 (three) times daily as needed for muscle spasms.    . nicotine (NICODERM CQ - DOSED IN MG/24 HOURS) 21 mg/24hr patch Place 1 patch (21 mg total) onto the skin daily. 28 patch 0   No current facility-administered medications on file prior to visit.     BP 123/70   Pulse 73   Ht 6\' 1"  (1.854 m)   Wt 220 lb (99.8 kg)   BMI 29.03 kg/m       Objective:   Physical Exam        Assessment & Plan:

## 2018-12-03 NOTE — Patient Instructions (Signed)
Patient has history of recent lumbar back pain with radiating features to both lower extremity.  MRI findings August 2019 show findings like could be a cause of recent symptoms.  Will treat with the 6-day taper dose of prednisone and advised to stop ibuprofen NSAID over-the-counter while on the prednisone.  Will add Norco for occasional severe pain.  Also will prescribe gabapentin to use at night for neuropathic type/radicular pain.  Went ahead and referred patient back to his former neurosurgeon in light of the severe pain and MRI findings.  Hopefully he will get some relief with the above.  If signs and symptoms worsen or change asked patient to notify us.  Follow-up in 10 days or as needed.

## 2018-12-11 ENCOUNTER — Encounter: Payer: Self-pay | Admitting: Medical

## 2018-12-16 ENCOUNTER — Telehealth: Payer: Self-pay | Admitting: Medical

## 2018-12-16 ENCOUNTER — Other Ambulatory Visit: Payer: Self-pay

## 2018-12-16 MED ORDER — HYDROCODONE-ACETAMINOPHEN 5-325 MG PO TABS: 1.0000 | ORAL_TABLET | Freq: Four times a day (QID) | ORAL | 0 refills | Status: DC | PRN

## 2018-12-16 NOTE — Telephone Encounter (Signed)
Copied from [facility] [facility] [id number]. Topic: General - Other >> Dec 16, 2018 11:12 AM Rainey Pines A wrote: Patient wants to let provider know that he has scheduled appt with neurosurgery on 02/14/2019 at 9am and that he is still in pain.

## 2018-12-16 NOTE — Telephone Encounter (Signed)
Would you let pt know that I can refill hydrocodone but want to talk with him on virtual visit as to how he is feeling regarding signs/symptoms and to see if he needs repeat taper prednisone.  Also want to discuss potential need for controlled med contract as his neurosurgeon appointment almost 2 months out and impractical/burdensome for both of [name]  to be sending short term refills every 4-5 days.

## 2018-12-16 NOTE — Telephone Encounter (Signed)
Patient calling to speak with [place] or his [place] about the pain he is in. His referral to neurosurgery was placed 06/16 but he has not heard from them and was advised that [facility] was out of the office via mychart and he needed to schedule an appointment to get assistance. Please advise as patient says he was already seen and would like steroids or pain medicine until he can get to neurosurgery. Patient says he can't seem to reach [facility] on [address] and the hold message says he can but he always gets a nurse lately.

## 2018-12-16 NOTE — Telephone Encounter (Signed)
Left pt a message to call back. Okay for [facility] to give information.  

## 2018-12-16 NOTE — Addendum Note (Signed)
Addended by: Anabel Halon on: 12/16/2018 11:40 AM   Modules accepted: Orders

## 2018-12-17 ENCOUNTER — Ambulatory Visit (INDEPENDENT_AMBULATORY_CARE_PROVIDER_SITE_OTHER): Admitting: Medical

## 2018-12-17 DIAGNOSIS — M5442 Lumbago with sciatica, left side: Secondary | ICD-10-CM

## 2018-12-17 DIAGNOSIS — M5441 Lumbago with sciatica, right side: Secondary | ICD-10-CM

## 2018-12-17 DIAGNOSIS — M5410 Radiculopathy, site unspecified: Secondary | ICD-10-CM

## 2018-12-17 NOTE — Patient Instructions (Addendum)
For back pain with some radicular symptoms recommend that you continue baclofen, gabapentin and hydrocodone. Offered prednisone but declined today stating last time did not help much.  Red flag signs and symptoms indicating cauda equina syndrome discussed with pt. If signs and symptoms worsen as discussed then ED evaluation.  Pt waiting for call back from neurosurgeon office for earlier appointment. If no call back by Monday send me my chart message. I would try to call neurosurgeon office to talk with staff in order to try to get Bryan Lane in quicker.  Follow up date with me to be determined.

## 2018-12-17 NOTE — Progress Notes (Signed)
Subjective:    Patient ID: Bryan Lane., male    DOB: 1960-12-09, 58 y.o.   MRN: [id number]  HPI  Virtual Visit via Video Note  I connected with Bryan Lane. on 12/17/18 at  3:00 PM EDT by a video enabled telemedicine application and verified that I am speaking with the correct person using two identifiers.  Location: Patient: home Provider: office.   I discussed the limitations of evaluation and management by telemedicine and the availability of in person appointments. The patient expressed understanding and agreed to proceed.  History of Present Illness: Pt reports his pain in lower back is worsening recently. He states wakes up usually in early morning with severe pain. He can put ice to lower back and will help after about 2 hours. Pt states has pain that runs down rt leg. He states his leg feels tight at times. Pt states some difficulty picking up his rt foot. He states walks with some difficulty. No bladder or bowel incontinence. Pt is scheduled appointment with Dr Arnoldo Morale on February 14, 2019.   Pt states recently his rt  foot fees numb at times, and leg feels weak.  In the past I had given him prednisone and he states it did not help much. I did call him some hydrocodone. This allowing him to sleep about 6 hours.  Pt has called neurosurgeon office note asking to be seen sooner.   Disc levels:  T12-L1: Normal.  L1-2: Mild, non-compressive disc bulge.  L2-3: Mild, non-compressive disc bulge.  L3-4: Moderate bulging of the disc. Mild facet and ligamentous hypertrophy. Mild stenosis of both lateral recesses but without likely neural compression.  L4-5: Moderate bulging of the disc. Mild facet and ligamentous hypertrophy. Mild stenosis of both lateral recesses but without likely neural compression.  L5-S1: Advanced bilateral facet arthropathy with anterolisthesis of 3 mm. Shallow broad-based disc herniation with slight caudal down turning behind the  superior endplate of S1. Stenosis of both lateral recesses and neural foramina that could cause neural compression on either or both sides. Findings are slightly worse on the right. Edema and enhancement of the facet joints could be associated with back pain or referred facet syndrome pain.  S1-2: No disc pathology. Bilateral facet osteoarthritis. Large synovial cyst projecting inward into the canal from the facet on the right, probably actually deriving from the inferior aspect of the right L5-S1 facet. This cyst measures up to 15 mm in size and could contribute to right-sided neural compressive symptoms.  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  L3-4 and L4-5 show moderate disc bulges and mild facet and ligamentous hypertrophy with mild stenosis of both lateral recesses but no definite neural compression.  L5-S1 shows advanced bilateral facet arthropathy allowing 3 mm of anterolisthesis. There is shallow broad-based herniation of disc material with slight caudal down turning behind the superior endplate of S1. There is stenosis of the lateral recesses and foramina that could cause neural compression on either or both sides. Findings slightly worse on the right. There is a 15 mm synovial cyst probably arising from the inferior aspect of the facet on the right at L5-S1 that encroaches upon the right side of the spinal canal behind the S1 vertebra, actually closer to the S1-2 disc level.      Observations/Objective: General-no acute distress, pleasant, oriented. Lungs- on inspection lungs appear unlabored. Neck- no tracheal deviation or jvd on inspection. Neuro- gross motor function appears intact. Rt lower ext- he states toes  always mild numb. No foot drop presently. Can raise his rt foot up just just little less than on left side.    Assessment and Plan: For back pain with some radicular symptoms recommend that you continue baclofen, gabapentin and hydrocodone.  Offered prednisone but declined today stating last time did not help much.  Red flag signs and symptoms indicating cauda equina syndrome discussed with pt. If signs and symptoms worsen as discussed then ED evaluation.  Pt waiting for call back from neurosurgeon office for earlier appointment. If no call back by Monday send me my chart message. I would try to call neurosurgeon office to talk with staff in order to try to get Tim in quicker.  Follow up date with me to be determined.  Mackie Pai, PA-C  Follow Up Instructions:    I discussed the assessment and treatment plan with the patient. The patient was provided an opportunity to ask questions and all were answered. The patient agreed with the plan and demonstrated an understanding of the instructions.   The patient was advised to call back or seek an in-person evaluation if the symptoms worsen or if the condition fails to improve as anticipated.  I provided 25  minutes of non-face-to-face time during this encounter.   Mackie Pai, PA-C    Review of Systems  Constitutional: Negative for chills and fatigue.  Respiratory: Negative for cough, chest tightness, shortness of breath and wheezing.   Cardiovascular: Negative for chest pain and palpitations.  Gastrointestinal: Negative for abdominal pain.  Genitourinary: Negative for difficulty urinating, dysuria, flank pain, frequency and urgency.  Musculoskeletal: Positive for back pain. Negative for myalgias and neck stiffness.  Skin: Negative for rash.  Neurological:       See hpi.  Hematological: Negative for adenopathy. Does not bruise/bleed easily.  Psychiatric/Behavioral: Negative for behavioral problems, confusion and sleep disturbance. The patient is not nervous/anxious.    Past Medical History:  Diagnosis Date   Bowel incontinence    due to cervical disc issue   Depression    with pain   Dyspnea    with  exertion and pain   Hyperlipidemia    Hypertension      2 years ago was on med for bp. hctz.   Neuromuscular disorder ([place])    Spinal cord injury   PTSD (post-traumatic stress disorder)    Urine incontinence 01/2018   due to cervical issue     Social History   Socioeconomic History   Marital status: Single    Spouse name: Not on file   Number of children: 2   Years of education: 16   Highest education level: Bachelor's degree (e.g., BA, AB, BS)  Occupational History   Occupation: applying for disability  Social Designer, fashion/clothing strain: Not on file   Food insecurity    Worry: Not on file    Inability: Not on file   Transportation needs    Medical: Not on file    Non-medical: Not on file  Tobacco Use   Smoking status: Current Every Day Smoker    Packs/day: 0.50    Years: 40.00    Pack years: 20.00   Smokeless tobacco: Never Used  Substance and Sexual Activity   Alcohol use: Yes    Comment: Socially   Drug use: Yes    Types: Marijuana    Comment: last 01/28/18    Sexual activity: Not Currently  Lifestyle   Physical activity    Days per week: Not  on file    Minutes per session: Not on file   Stress: Not on file  Relationships   Social connections    Talks on phone: Not on file    Gets together: Not on file    Attends religious service: Not on file    Active member of club or organization: Not on file    Attends meetings of clubs or organizations: Not on file    Relationship status: Not on file   Intimate partner violence    Fear of current or ex partner: Not on file    Emotionally abused: Not on file    Physically abused: Not on file    Forced sexual activity: Not on file  Other Topics Concern   Not on file  Social History Narrative   Lives with dad and stepmother in a one story home.  Has 2 children.     Applying for disability.  Education: BS    Past Surgical History:  Procedure Laterality Date   ANTERIOR CERVICAL DECOMPRESSION/DISCECTOMY FUSION 4 LEVELS N/A 01/31/2018    Procedure: ANTERIOR CERVICAL DECOMPRESSION/DISCECTOMY FUSION, INTERBODY PROSTHESIS, PLATE/SCREWS CERVICAL THREE- CERVICAL FOUR, CERVICAL FOUR - CERVICAL FIVE, CERVICAL FIVE - CERVICAL SIX, CERVICAL SIX- CERVICAL SEVEN;  Surgeon: Newman Pies, MD;  Location: [facility];  Service: Neurosurgery;  Laterality: N/A;  ANTERIOR CERVICAL DECOMPRESSION/DISCECTOMY FUSION, INTERBODY PROSTHESIS, PLATE/SCREWS CER   HAND SURGERY Right    "BB removal"   SHOULDER SURGERY Left 2017   acl repair   Wisdom teeth removal      Family History  Problem Relation Age of Onset   Diabetes Father    Colon polyps Father    Heart attack Mother    Alcoholism Mother    Hypertension Sister    Colon cancer Neg Hx    Esophageal cancer Neg Hx    Rectal cancer Neg Hx    Stomach cancer Neg Hx     No Known Allergies  Current Outpatient Medications on File Prior to Visit  Medication Sig Dispense Refill   acetaminophen (TYLENOL) 500 MG tablet Take 500 mg by mouth every 6 (six) hours as needed.     baclofen (LIORESAL) 10 MG tablet TAKE ONE (1) TABLET BY MOUTH 3 TIMES DAILY(can take additional tab at night if needed) 120 each 3   cyclobenzaprine (FLEXERIL) 10 MG tablet Take 10 mg by mouth 3 (three) times daily as needed for muscle spasms.     gabapentin (NEURONTIN) 100 MG capsule Take 1 capsule (100 mg total) by mouth at bedtime. 30 capsule 0   HYDROcodone-acetaminophen (NORCO) 5-325 MG tablet Take 1 tablet by mouth every 6 (six) hours as needed for moderate pain. 16 tablet 0   nicotine (NICODERM CQ - DOSED IN MG/24 HOURS) 21 mg/24hr patch Place 1 patch (21 mg total) onto the skin daily. 28 patch 0   predniSONE (STERAPRED UNI-PAK 21 TAB) 5 MG (21) TBPK tablet Taper over 6 days 21 tablet 0   No current facility-administered medications on file prior to visit.     There were no vitals taken for this visit.      Objective:   Physical Exam        Assessment & Plan:

## 2018-12-19 ENCOUNTER — Encounter: Payer: Self-pay | Admitting: Medical

## 2018-12-19 ENCOUNTER — Telehealth: Payer: Self-pay | Admitting: Medical

## 2018-12-19 NOTE — Telephone Encounter (Signed)
Opened to review note.

## 2019-01-02 ENCOUNTER — Encounter: Payer: Self-pay | Admitting: Medical

## 2019-01-10 ENCOUNTER — Other Ambulatory Visit (HOSPITAL_BASED_OUTPATIENT_CLINIC_OR_DEPARTMENT_OTHER): Payer: Self-pay | Admitting: Neurosurgery

## 2019-01-10 ENCOUNTER — Other Ambulatory Visit: Payer: Self-pay | Admitting: Neurosurgery

## 2019-01-10 DIAGNOSIS — M5441 Lumbago with sciatica, right side: Secondary | ICD-10-CM

## 2019-01-12 ENCOUNTER — Other Ambulatory Visit: Payer: Self-pay

## 2019-01-12 ENCOUNTER — Ambulatory Visit (INDEPENDENT_AMBULATORY_CARE_PROVIDER_SITE_OTHER)

## 2019-01-12 DIAGNOSIS — M5441 Lumbago with sciatica, right side: Secondary | ICD-10-CM | POA: Diagnosis not present

## 2019-01-16 ENCOUNTER — Other Ambulatory Visit: Payer: Self-pay | Admitting: Neurosurgery

## 2019-01-16 ENCOUNTER — Encounter: Payer: Self-pay | Admitting: Medical

## 2019-01-23 ENCOUNTER — Other Ambulatory Visit: Payer: Self-pay | Admitting: Neurosurgery

## 2019-02-03 ENCOUNTER — Other Ambulatory Visit: Payer: Self-pay

## 2019-02-03 ENCOUNTER — Other Ambulatory Visit (HOSPITAL_COMMUNITY)
Admission: RE | Admit: 2019-02-03 | Discharge: 2019-02-03 | Disposition: A | Discharge status: Home / Self Care | Source: Ambulatory Visit | Attending: Neurosurgery | Admitting: Neurosurgery

## 2019-02-03 ENCOUNTER — Encounter (HOSPITAL_COMMUNITY)
Admission: RE | Admit: 2019-02-03 | Discharge: 2019-02-03 | Disposition: A | Discharge status: Home / Self Care | Source: Ambulatory Visit | Attending: Neurosurgery | Admitting: Neurosurgery

## 2019-02-03 DIAGNOSIS — I10 Essential (primary) hypertension: Secondary | ICD-10-CM | POA: Insufficient documentation

## 2019-02-03 DIAGNOSIS — Z20828 Contact with and (suspected) exposure to other viral communicable diseases: Secondary | ICD-10-CM | POA: Insufficient documentation

## 2019-02-03 DIAGNOSIS — Z01818 Encounter for other preprocedural examination: Secondary | ICD-10-CM | POA: Insufficient documentation

## 2019-02-03 LAB — CBC
HCT: 42.7 % (ref 39.0–52.0)
Hemoglobin: 14.1 g/dL (ref 13.0–17.0)
MCH: 32.2 pg (ref 26.0–34.0)
MCHC: 33 g/dL (ref 30.0–36.0)
MCV: 97.5 fL (ref 80.0–100.0)
Platelets: 321 10*3/uL (ref 150–400)
RBC: 4.38 MIL/uL (ref 4.22–5.81)
RDW: 14.3 % (ref 11.5–15.5)
WBC: 14.7 10*3/uL — ABNORMAL HIGH (ref 4.0–10.5)
nRBC: 0 % (ref 0.0–0.2)

## 2019-02-03 LAB — BASIC METABOLIC PANEL
Anion gap: 8 (ref 5–15)
BUN: 11 mg/dL (ref 6–20)
CO2: 24 mmol/L (ref 22–32)
Calcium: 9.5 mg/dL (ref 8.9–10.3)
Chloride: 108 mmol/L (ref 98–111)
Creatinine, Ser: 0.95 mg/dL (ref 0.61–1.24)
GFR calc Af Amer: >60 mL/min (ref >60)
GFR calc non Af Amer: >60 mL/min (ref >60)
Glucose, Bld: 99 mg/dL (ref 70–99)
Potassium: 4.5 mmol/L (ref 3.5–5.1)
Sodium: 140 mmol/L (ref 135–145)

## 2019-02-03 LAB — SURGICAL PCR SCREEN
MRSA, PCR: NEGATIVE
Staphylococcus aureus: NEGATIVE

## 2019-02-03 LAB — SARS CORONAVIRUS 2 (TAT 6-24 HRS): SARS Coronavirus 2: NEGATIVE

## 2019-02-03 MED ORDER — CHLORHEXIDINE GLUCONATE CLOTH 2 % EX PADS: 6.0000 | MEDICATED_PAD | Freq: Once | CUTANEOUS | Status: DC

## 2019-02-03 NOTE — Progress Notes (Signed)
PCP - E Sanguier pa at [employer] in [employer] Cardiologist - na  Chest x-ray - na EKG - 02/03/19     S    C   Aspirin Instructions:stop  Anesthesia review: review ekg   Patient denies shortness of breath, fever, cough and chest pain at PAT appointment   Patient verbalized understanding of instructions that were given to them at the PAT appointment. Patient was also instructed that they will need to review over the PAT instructions again at home before surgery.

## 2019-02-03 NOTE — Pre-Procedure Instructions (Signed)
Bryan Angst Jr.  02/03/2019      [facility] - [place], [place] - [address] [place] [address] Phone: [phone] Fax: [phone]    Your procedure is scheduled on 02/06/19.  Report to [facility] Admitting at 930 A.M.  Call this number if you have problems the morning of surgery:  [phone]   Remember:  Do not eat or drink after midnight.  You m Take these medicines the morning of surgery with A SIP OF WATER ----TYLENOL,BACLOFEN    Do not wear jewelry, make-up or nail polish.  Do not wear lotions, powders, or perfumes, or deodorant.  Do not shave 48 hours prior to surgery.  Men may shave face and neck.  Do not bring valuables to the hospital.  [facility] is not responsible for any belongings or valuables.  Contacts, dentures or bridgework may not be worn into surgery.  Leave your suitcase in the car.  After surgery it may be brought to your room.  For patients admitted to the hospital, discharge time will be determined by your treatment team.  Patients discharged the day of surgery will not be allowed to drive home.   Special instructions:  Do not take any aspirin,anti-inflammatories,vitamins,or herbal supplements 5-7 days prior to surgery.[facility] - Preparing for Surgery  Before surgery, you can play an important role.  Because skin is not sterile, your skin needs to be as free of germs as possible.  You can reduce the number of germs on you skin by washing with CHG (chlorahexidine gluconate) soap before surgery.  CHG is an antiseptic cleaner which kills germs and bonds with the skin to continue killing germs even after washing.  Oral Hygiene is also important in reducing the risk of infection.  Remember to brush your teeth with your regular toothpaste the morning of surgery.  Please DO NOT use if you have an allergy to CHG or antibacterial soaps.  If your skin becomes reddened/irritated stop using the CHG and  inform your nurse when you arrive at Short Stay.  Do not shave (including legs and underarms) for at least 48 hours prior to the first CHG shower.  You may shave your face.  Please follow these instructions carefully:   1.  Shower with CHG Soap the night before surgery and the morning of Surgery.  2.  If you choose to wash your hair, wash your hair first as usual with your normal shampoo.  3.  After you shampoo, rinse your hair and body thoroughly to remove the shampoo. 4.  Use CHG as you would any other liquid soap.  You can apply chg directly to the skin and wash gently with a      scrungie or washcloth.           5.  Apply the CHG Soap to your body ONLY FROM THE NECK DOWN.   Do not use on open wounds or open sores. Avoid contact with your eyes, ears, mouth and genitals (private parts).  Wash genitals (private parts) with your normal soap.  6.  Wash thoroughly, paying special attention to the area where your surgery will be performed.  7.  Thoroughly rinse your body with warm water from the neck down.  8.  DO NOT shower/wash with your normal soap after using and rinsing off the CHG Soap.  9.  Pat yourself dry with a clean towel.  10.  Wear clean pajamas.            11.  Place clean sheets on your bed the night of your first shower and do not sleep with pets.  Day of Surgery  Do not apply any lotions/deoderants the morning of surgery.   Please wear clean clothes to the hospital/surgery center. Remember to brush your teeth with toothpaste.    Please read over the following fact sheets that you were given. MRSA Information

## 2019-02-04 LAB — TYPE AND SCREEN
ABO/RH(D): O POS
Antibody Screen: NEGATIVE

## 2019-02-06 ENCOUNTER — Encounter (HOSPITAL_COMMUNITY): Payer: Self-pay

## 2019-02-06 ENCOUNTER — Other Ambulatory Visit: Payer: Self-pay

## 2019-02-06 ENCOUNTER — Inpatient Hospital Stay (HOSPITAL_COMMUNITY)

## 2019-02-06 ENCOUNTER — Encounter (HOSPITAL_COMMUNITY)
Admission: RE | Disposition: A | Discharge status: Home / Self Care | Payer: Self-pay | Source: Home / Self Care | Attending: Neurosurgery

## 2019-02-06 ENCOUNTER — Inpatient Hospital Stay (HOSPITAL_COMMUNITY)
Admission: RE | Admit: 2019-02-06 | Discharge: 2019-02-07 | DRG: 455 | Disposition: A | Discharge status: Home / Self Care | Source: Ambulatory Visit | Attending: Neurosurgery | Admitting: Neurosurgery

## 2019-02-06 ENCOUNTER — Inpatient Hospital Stay (HOSPITAL_COMMUNITY): Admitting: Physician Assistant

## 2019-02-06 DIAGNOSIS — F4310 Post-traumatic stress disorder, unspecified: Secondary | ICD-10-CM | POA: Diagnosis present

## 2019-02-06 DIAGNOSIS — Z79899 Other long term (current) drug therapy: Secondary | ICD-10-CM | POA: Diagnosis not present

## 2019-02-06 DIAGNOSIS — M4316 Spondylolisthesis, lumbar region: Secondary | ICD-10-CM | POA: Diagnosis present

## 2019-02-06 DIAGNOSIS — M48062 Spinal stenosis, lumbar region with neurogenic claudication: Secondary | ICD-10-CM | POA: Diagnosis present

## 2019-02-06 DIAGNOSIS — Z8249 Family history of ischemic heart disease and other diseases of the circulatory system: Secondary | ICD-10-CM

## 2019-02-06 DIAGNOSIS — M5116 Intervertebral disc disorders with radiculopathy, lumbar region: Principal | ICD-10-CM | POA: Diagnosis present

## 2019-02-06 DIAGNOSIS — F17210 Nicotine dependence, cigarettes, uncomplicated: Secondary | ICD-10-CM | POA: Diagnosis present

## 2019-02-06 DIAGNOSIS — M4726 Other spondylosis with radiculopathy, lumbar region: Secondary | ICD-10-CM | POA: Diagnosis present

## 2019-02-06 DIAGNOSIS — Z01812 Encounter for preprocedural laboratory examination: Secondary | ICD-10-CM

## 2019-02-06 DIAGNOSIS — E785 Hyperlipidemia, unspecified: Secondary | ICD-10-CM | POA: Diagnosis present

## 2019-02-06 DIAGNOSIS — I10 Essential (primary) hypertension: Secondary | ICD-10-CM | POA: Diagnosis present

## 2019-02-06 DIAGNOSIS — F329 Major depressive disorder, single episode, unspecified: Secondary | ICD-10-CM | POA: Diagnosis present

## 2019-02-06 DIAGNOSIS — Z981 Arthrodesis status: Secondary | ICD-10-CM

## 2019-02-06 DIAGNOSIS — Z20828 Contact with and (suspected) exposure to other viral communicable diseases: Secondary | ICD-10-CM | POA: Diagnosis present

## 2019-02-06 DIAGNOSIS — Z419 Encounter for procedure for purposes other than remedying health state, unspecified: Secondary | ICD-10-CM

## 2019-02-06 HISTORY — DX: Spondylolisthesis, lumbar region: M43.16

## 2019-02-06 SURGERY — POSTERIOR LUMBAR FUSION 1 LEVEL
Anesthesia: General | Site: Spine Lumbar

## 2019-02-06 MED ORDER — FENTANYL CITRATE (PF) 250 MCG/5ML IJ SOLN
INTRAMUSCULAR | Status: AC
  Filled 2019-02-06: qty 5

## 2019-02-06 MED ORDER — ONDANSETRON HCL 4 MG/2ML IJ SOLN
INTRAMUSCULAR | Status: DC | PRN
  Administered 2019-02-06: 4 mg via INTRAVENOUS

## 2019-02-06 MED ORDER — GLYCOPYRROLATE PF 0.2 MG/ML IJ SOSY
PREFILLED_SYRINGE | INTRAMUSCULAR | Status: AC
  Filled 2019-02-06: qty 1

## 2019-02-06 MED ORDER — DEXAMETHASONE SODIUM PHOSPHATE 10 MG/ML IJ SOLN
INTRAMUSCULAR | Status: DC | PRN
  Administered 2019-02-06: 10 mg via INTRAVENOUS

## 2019-02-06 MED ORDER — CEFAZOLIN SODIUM-DEXTROSE 2-4 GM/100ML-% IV SOLN
2.0000 g | Freq: Three times a day (TID) | INTRAVENOUS | Status: AC
  Administered 2019-02-06 – 2019-02-07 (×2): 2 g via INTRAVENOUS
  Filled 2019-02-06 (×2): qty 100

## 2019-02-06 MED ORDER — ONDANSETRON HCL 4 MG/2ML IJ SOLN
INTRAMUSCULAR | Status: AC
  Filled 2019-02-06: qty 2

## 2019-02-06 MED ORDER — MIDAZOLAM HCL 5 MG/5ML IJ SOLN
INTRAMUSCULAR | Status: DC | PRN
  Administered 2019-02-06: 2 mg via INTRAVENOUS

## 2019-02-06 MED ORDER — 0.9 % SODIUM CHLORIDE (POUR BTL) OPTIME
TOPICAL | Status: DC | PRN
  Administered 2019-02-06: 12:00:00 1000 mL

## 2019-02-06 MED ORDER — HYDROCODONE-ACETAMINOPHEN 7.5-325 MG PO TABS: 1.0000 | ORAL_TABLET | Freq: Once | ORAL | Status: DC | PRN

## 2019-02-06 MED ORDER — LIDOCAINE 2% (20 MG/ML) 5 ML SYRINGE
INTRAMUSCULAR | Status: AC
  Filled 2019-02-06: qty 5

## 2019-02-06 MED ORDER — SODIUM CHLORIDE 0.9% FLUSH: 3.0000 mL | INTRAVENOUS | Status: DC | PRN

## 2019-02-06 MED ORDER — MENTHOL 3 MG MT LOZG: 1.0000 | LOZENGE | OROMUCOSAL | Status: DC | PRN

## 2019-02-06 MED ORDER — THROMBIN 5000 UNITS EX SOLR
CUTANEOUS | Status: AC
  Filled 2019-02-06: qty 5000

## 2019-02-06 MED ORDER — SODIUM CHLORIDE 0.9 % IV SOLN
INTRAVENOUS | Status: DC | PRN
  Administered 2019-02-06: 500 mL

## 2019-02-06 MED ORDER — HYDROCODONE-ACETAMINOPHEN 5-325 MG PO TABS: 1.0000 | ORAL_TABLET | ORAL | Status: DC | PRN

## 2019-02-06 MED ORDER — BACLOFEN 10 MG PO TABS
10.0000 mg | ORAL_TABLET | Freq: Three times a day (TID) | ORAL | Status: DC
  Administered 2019-02-06 – 2019-02-07 (×2): 10 mg via ORAL
  Filled 2019-02-06 (×2): qty 1

## 2019-02-06 MED ORDER — BUPIVACAINE-EPINEPHRINE (PF) 0.5% -1:200000 IJ SOLN
INTRAMUSCULAR | Status: AC
  Filled 2019-02-06: qty 30

## 2019-02-06 MED ORDER — DOCUSATE SODIUM 100 MG PO CAPS
100.0000 mg | ORAL_CAPSULE | Freq: Two times a day (BID) | ORAL | Status: DC
  Administered 2019-02-06 – 2019-02-07 (×2): 100 mg via ORAL
  Filled 2019-02-06 (×2): qty 1

## 2019-02-06 MED ORDER — ACETAMINOPHEN 650 MG RE SUPP: 650.0000 mg | RECTAL | Status: DC | PRN

## 2019-02-06 MED ORDER — BACITRACIN ZINC 500 UNIT/GM EX OINT
TOPICAL_OINTMENT | CUTANEOUS | Status: DC | PRN
  Administered 2019-02-06: 1 via TOPICAL

## 2019-02-06 MED ORDER — BACLOFEN 10 MG PO TABS: 10.0000 mg | ORAL_TABLET | Freq: Every evening | ORAL | Status: DC | PRN

## 2019-02-06 MED ORDER — MIDAZOLAM HCL 2 MG/2ML IJ SOLN
INTRAMUSCULAR | Status: AC
  Filled 2019-02-06: qty 2

## 2019-02-06 MED ORDER — SODIUM CHLORIDE 0.9% FLUSH: 3.0000 mL | Freq: Two times a day (BID) | INTRAVENOUS | Status: DC

## 2019-02-06 MED ORDER — PHENOL 1.4 % MT LIQD: 1.0000 | OROMUCOSAL | Status: DC | PRN

## 2019-02-06 MED ORDER — ACETAMINOPHEN 10 MG/ML IV SOLN
1000.0000 mg | Freq: Once | INTRAVENOUS | Status: DC | PRN
  Administered 2019-02-06: 1000 mg via INTRAVENOUS

## 2019-02-06 MED ORDER — SODIUM CHLORIDE 0.9 % IV SOLN: 250.0000 mL | INTRAVENOUS | Status: DC

## 2019-02-06 MED ORDER — MEPERIDINE HCL 25 MG/ML IJ SOLN: 6.2500 mg | INTRAMUSCULAR | Status: DC | PRN

## 2019-02-06 MED ORDER — OXYCODONE HCL 5 MG PO TABS
ORAL_TABLET | ORAL | Status: AC
  Filled 2019-02-06: qty 1

## 2019-02-06 MED ORDER — OXYCODONE HCL 5 MG PO TABS
10.0000 mg | ORAL_TABLET | ORAL | Status: DC | PRN
  Administered 2019-02-06 – 2019-02-07 (×6): 10 mg via ORAL
  Filled 2019-02-06 (×6): qty 2

## 2019-02-06 MED ORDER — DEXAMETHASONE SODIUM PHOSPHATE 10 MG/ML IJ SOLN
INTRAMUSCULAR | Status: AC
  Filled 2019-02-06: qty 1

## 2019-02-06 MED ORDER — CYCLOBENZAPRINE HCL 10 MG PO TABS
10.0000 mg | ORAL_TABLET | Freq: Three times a day (TID) | ORAL | Status: DC | PRN
  Administered 2019-02-06 – 2019-02-07 (×2): 10 mg via ORAL
  Filled 2019-02-06 (×2): qty 1

## 2019-02-06 MED ORDER — BISACODYL 10 MG RE SUPP: 10.0000 mg | Freq: Every day | RECTAL | Status: DC | PRN

## 2019-02-06 MED ORDER — ACETAMINOPHEN 325 MG PO TABS: 650.0000 mg | ORAL_TABLET | ORAL | Status: DC | PRN

## 2019-02-06 MED ORDER — CEFAZOLIN SODIUM-DEXTROSE 2-4 GM/100ML-% IV SOLN
INTRAVENOUS | Status: AC
  Filled 2019-02-06: qty 100

## 2019-02-06 MED ORDER — ONDANSETRON HCL 4 MG/2ML IJ SOLN: 4.0000 mg | Freq: Four times a day (QID) | INTRAMUSCULAR | Status: DC | PRN

## 2019-02-06 MED ORDER — SODIUM CHLORIDE 0.9 % IV SOLN
INTRAVENOUS | Status: DC | PRN
  Administered 2019-02-06: 60 ug/min via INTRAVENOUS

## 2019-02-06 MED ORDER — OXYCODONE HCL 5 MG PO TABS: 5.0000 mg | ORAL_TABLET | ORAL | Status: DC | PRN

## 2019-02-06 MED ORDER — BACLOFEN 10 MG PO TABS: 10.0000 mg | ORAL_TABLET | ORAL | Status: DC

## 2019-02-06 MED ORDER — LACTATED RINGERS IV SOLN
INTRAVENOUS | Status: DC | PRN
  Administered 2019-02-06: 12:00:00 via INTRAVENOUS

## 2019-02-06 MED ORDER — MORPHINE SULFATE (PF) 4 MG/ML IV SOLN: 4.0000 mg | INTRAVENOUS | Status: DC | PRN

## 2019-02-06 MED ORDER — THROMBIN 5000 UNITS EX SOLR
OROMUCOSAL | Status: DC | PRN
  Administered 2019-02-06: 5 mL

## 2019-02-06 MED ORDER — HYDROMORPHONE HCL 1 MG/ML IJ SOLN
INTRAMUSCULAR | Status: AC
  Filled 2019-02-06: qty 1

## 2019-02-06 MED ORDER — LIDOCAINE 2% (20 MG/ML) 5 ML SYRINGE
INTRAMUSCULAR | Status: DC | PRN
  Administered 2019-02-06: 100 mg via INTRAVENOUS

## 2019-02-06 MED ORDER — ALBUMIN HUMAN 5 % IV SOLN
INTRAVENOUS | Status: DC | PRN
  Administered 2019-02-06: 13:00:00 via INTRAVENOUS

## 2019-02-06 MED ORDER — PHENYLEPHRINE 40 MCG/ML (10ML) SYRINGE FOR IV PUSH (FOR BLOOD PRESSURE SUPPORT)
PREFILLED_SYRINGE | INTRAVENOUS | Status: DC | PRN
  Administered 2019-02-06: 80 ug via INTRAVENOUS

## 2019-02-06 MED ORDER — PROPOFOL 10 MG/ML IV BOLUS
INTRAVENOUS | Status: DC | PRN
  Administered 2019-02-06: 140 mg via INTRAVENOUS

## 2019-02-06 MED ORDER — ROCURONIUM BROMIDE 50 MG/5ML IV SOSY
PREFILLED_SYRINGE | INTRAVENOUS | Status: DC | PRN
  Administered 2019-02-06 (×3): 20 mg via INTRAVENOUS
  Administered 2019-02-06: 10 mg via INTRAVENOUS
  Administered 2019-02-06: 50 mg via INTRAVENOUS
  Administered 2019-02-06: 10 mg via INTRAVENOUS

## 2019-02-06 MED ORDER — ROCURONIUM BROMIDE 10 MG/ML (PF) SYRINGE
PREFILLED_SYRINGE | INTRAVENOUS | Status: AC
  Filled 2019-02-06: qty 10

## 2019-02-06 MED ORDER — BUPIVACAINE LIPOSOME 1.3 % IJ SUSP
20.0000 mL | INTRAMUSCULAR | Status: AC
  Administered 2019-02-06: 20 mL
  Filled 2019-02-06: qty 20

## 2019-02-06 MED ORDER — ACETAMINOPHEN 500 MG PO TABS
1000.0000 mg | ORAL_TABLET | Freq: Four times a day (QID) | ORAL | Status: DC
  Administered 2019-02-06 – 2019-02-07 (×3): 1000 mg via ORAL
  Filled 2019-02-06 (×3): qty 2

## 2019-02-06 MED ORDER — PHENYLEPHRINE HCL (PRESSORS) 10 MG/ML IV SOLN
INTRAVENOUS | Status: AC
  Filled 2019-02-06: qty 1

## 2019-02-06 MED ORDER — ONDANSETRON HCL 4 MG PO TABS: 4.0000 mg | ORAL_TABLET | Freq: Four times a day (QID) | ORAL | Status: DC | PRN

## 2019-02-06 MED ORDER — THROMBIN 20000 UNITS EX SOLR
CUTANEOUS | Status: AC
  Filled 2019-02-06: qty 20000

## 2019-02-06 MED ORDER — FENTANYL CITRATE (PF) 100 MCG/2ML IJ SOLN
INTRAMUSCULAR | Status: DC | PRN
  Administered 2019-02-06 (×7): 50 ug via INTRAVENOUS

## 2019-02-06 MED ORDER — GLYCOPYRROLATE PF 0.2 MG/ML IJ SOSY
PREFILLED_SYRINGE | INTRAMUSCULAR | Status: DC | PRN
  Administered 2019-02-06: .1 mg via INTRAVENOUS

## 2019-02-06 MED ORDER — PROMETHAZINE HCL 25 MG/ML IJ SOLN: 6.2500 mg | INTRAMUSCULAR | Status: DC | PRN

## 2019-02-06 MED ORDER — LACTATED RINGERS IV SOLN
INTRAVENOUS | Status: DC | PRN
  Administered 2019-02-06 (×2): via INTRAVENOUS

## 2019-02-06 MED ORDER — HYDROMORPHONE HCL 1 MG/ML IJ SOLN
0.2500 mg | INTRAMUSCULAR | Status: DC | PRN
  Administered 2019-02-06 (×4): 0.5 mg via INTRAVENOUS

## 2019-02-06 MED ORDER — PROPOFOL 10 MG/ML IV BOLUS
INTRAVENOUS | Status: AC
  Filled 2019-02-06: qty 20

## 2019-02-06 MED ORDER — BUPIVACAINE HCL (PF) 0.5 % IJ SOLN
INTRAMUSCULAR | Status: AC
  Filled 2019-02-06: qty 30

## 2019-02-06 MED ORDER — BACITRACIN ZINC 500 UNIT/GM EX OINT
TOPICAL_OINTMENT | CUTANEOUS | Status: AC
  Filled 2019-02-06: qty 28.35

## 2019-02-06 MED ORDER — THROMBIN 20000 UNITS EX SOLR
CUTANEOUS | Status: DC | PRN
  Administered 2019-02-06: 20 mL

## 2019-02-06 MED ORDER — CEFAZOLIN SODIUM-DEXTROSE 2-4 GM/100ML-% IV SOLN
2.0000 g | INTRAVENOUS | Status: AC
  Administered 2019-02-06: 2 g via INTRAVENOUS

## 2019-02-06 MED ORDER — BUPIVACAINE-EPINEPHRINE (PF) 0.5% -1:200000 IJ SOLN
INTRAMUSCULAR | Status: DC | PRN
  Administered 2019-02-06: 10 mL

## 2019-02-06 MED ORDER — ACETAMINOPHEN 10 MG/ML IV SOLN
INTRAVENOUS | Status: AC
  Filled 2019-02-06: qty 100

## 2019-02-06 SURGICAL SUPPLY — 66 items
BAG DECANTER FOR FLEXI CONT (MISCELLANEOUS) ×3 IMPLANT
BENZOIN TINCTURE PRP APPL 2/3 (GAUZE/BANDAGES/DRESSINGS) ×3 IMPLANT
BLADE CLIPPER SURG (BLADE) IMPLANT
BUR MATCHSTICK NEURO 3.0 LAGG (BURR) ×3 IMPLANT
BUR PRECISION FLUTE 6.0 (BURR) ×3 IMPLANT
CANISTER SUCT 3000ML PPV (MISCELLANEOUS) ×3 IMPLANT
CAP REVERE LOCKING (Cap) ×8 IMPLANT
CARTRIDGE OIL MAESTRO DRILL (MISCELLANEOUS) ×1 IMPLANT
CLOSURE WOUND 1/2 X4 (GAUZE/BANDAGES/DRESSINGS) ×1
CONT SPEC 4OZ CLIKSEAL STRL BL (MISCELLANEOUS) ×3 IMPLANT
COVER BACK TABLE 60X90IN (DRAPES) ×3 IMPLANT
DECANTER SPIKE VIAL GLASS SM (MISCELLANEOUS) ×3 IMPLANT
DIFFUSER DRILL AIR PNEUMATIC (MISCELLANEOUS) ×3 IMPLANT
DRAPE C-ARM 42X72 X-RAY (DRAPES) ×8 IMPLANT
DRAPE HALF SHEET 40X57 (DRAPES) ×3 IMPLANT
DRAPE LAPAROTOMY 100X72X124 (DRAPES) ×3 IMPLANT
DRAPE SURG 17X23 STRL (DRAPES) ×12 IMPLANT
DRSG OPSITE POSTOP 4X6 (GAUZE/BANDAGES/DRESSINGS) ×2 IMPLANT
DRSG OPSITE POSTOP 4X8 (GAUZE/BANDAGES/DRESSINGS) ×2 IMPLANT
ELECT BLADE 4.0 EZ CLEAN MEGAD (MISCELLANEOUS) ×3
ELECT CAUTERY BLADE 6.4 (BLADE) ×2 IMPLANT
ELECT REM PT RETURN 9FT ADLT (ELECTROSURGICAL) ×3
ELECTRODE BLDE 4.0 EZ CLN MEGD (MISCELLANEOUS) ×1 IMPLANT
ELECTRODE REM PT RTRN 9FT ADLT (ELECTROSURGICAL) ×1 IMPLANT
EVACUATOR 1/8 PVC DRAIN (DRAIN) IMPLANT
GAUZE 4X4 16PLY RFD (DISPOSABLE) ×3 IMPLANT
GAUZE SPONGE 4X4 12PLY STRL (GAUZE/BANDAGES/DRESSINGS) ×3 IMPLANT
GLOVE BIO SURGEON STRL SZ8 (GLOVE) ×8 IMPLANT
GLOVE BIO SURGEON STRL SZ8.5 (GLOVE) ×6 IMPLANT
GLOVE BIOGEL PI IND STRL 7.0 (GLOVE) IMPLANT
GLOVE BIOGEL PI IND STRL 8.5 (GLOVE) IMPLANT
GLOVE BIOGEL PI INDICATOR 7.0 (GLOVE) ×2
GLOVE BIOGEL PI INDICATOR 8.5 (GLOVE) ×2
GOWN STRL REUS W/ TWL LRG LVL3 (GOWN DISPOSABLE) IMPLANT
GOWN STRL REUS W/ TWL XL LVL3 (GOWN DISPOSABLE) ×2 IMPLANT
GOWN STRL REUS W/TWL 2XL LVL3 (GOWN DISPOSABLE) IMPLANT
GOWN STRL REUS W/TWL LRG LVL3 (GOWN DISPOSABLE) ×4
GOWN STRL REUS W/TWL XL LVL3 (GOWN DISPOSABLE) ×6
HEMOSTAT POWDER KIT SURGIFOAM (HEMOSTASIS) ×3 IMPLANT
KIT BASIN OR (CUSTOM PROCEDURE TRAY) ×3 IMPLANT
KIT TURNOVER KIT B (KITS) ×3 IMPLANT
NDL HYPO 21X1.5 SAFETY (NEEDLE) IMPLANT
NEEDLE HYPO 21X1.5 SAFETY (NEEDLE) ×3 IMPLANT
NEEDLE HYPO 22GX1.5 SAFETY (NEEDLE) ×3 IMPLANT
NS IRRIG 1000ML POUR BTL (IV SOLUTION) ×3 IMPLANT
OIL CARTRIDGE MAESTRO DRILL (MISCELLANEOUS) ×3
PACK LAMINECTOMY NEURO (CUSTOM PROCEDURE TRAY) ×3 IMPLANT
PAD ARMBOARD 7.5X6 YLW CONV (MISCELLANEOUS) ×9 IMPLANT
PATTIES SURGICAL .5 X1 (DISPOSABLE) IMPLANT
PATTIES SURGICAL 1X1 (DISPOSABLE) ×2 IMPLANT
PUTTY DBM 10CC CALC GRAN (Putty) ×2 IMPLANT
ROD REVERE 6.35 45MM (Rod) ×4 IMPLANT
SCREW 7.5X50MM (Screw) ×8 IMPLANT
SPACER ALTERA 10X31-15 (Spacer) ×2 IMPLANT
SPONGE LAP 4X18 RFD (DISPOSABLE) IMPLANT
SPONGE NEURO XRAY DETECT 1X3 (DISPOSABLE) IMPLANT
SPONGE SURGIFOAM ABS GEL 100 (HEMOSTASIS) ×2 IMPLANT
STRIP CLOSURE SKIN 1/2X4 (GAUZE/BANDAGES/DRESSINGS) ×2 IMPLANT
SUT VIC AB 1 CT1 18XBRD ANBCTR (SUTURE) ×2 IMPLANT
SUT VIC AB 1 CT1 8-18 (SUTURE) ×6
SUT VIC AB 2-0 CP2 18 (SUTURE) ×6 IMPLANT
SYR 20ML LL LF (SYRINGE) ×2 IMPLANT
TOWEL GREEN STERILE (TOWEL DISPOSABLE) ×3 IMPLANT
TOWEL GREEN STERILE FF (TOWEL DISPOSABLE) ×3 IMPLANT
TRAY FOLEY MTR SLVR 16FR STAT (SET/KITS/TRAYS/PACK) ×3 IMPLANT
WATER STERILE IRR 1000ML POUR (IV SOLUTION) ×3 IMPLANT

## 2019-02-06 NOTE — Transfer of Care (Signed)
Immediate Anesthesia Transfer of Care Note  Patient: Bryan Lane.  Procedure(s) Performed: POSTERIOR LUMBAR INTERBODY FUSION, INTERBODY PROSTHESIS,POSTERIOR INSTRUMENTATION LUMBAR FOUR- LUMBAR FIVE (N/A Spine Lumbar)  Patient Location: PACU  Anesthesia Type:General  Level of Consciousness: sedated  Airway & Oxygen Therapy: Patient Spontanous Breathing and Patient connected to nasal cannula oxygen  Post-op Assessment: Report given to RN and Post -op Vital signs reviewed and stable  Post vital signs: Reviewed and stable  Last Vitals:  Vitals Value Taken Time  BP 123/71 02/06/19 1508  Temp    Pulse 96 02/06/19 1510  Resp 15 02/06/19 1511  SpO2 97 % 02/06/19 1510  Vitals shown include unvalidated device data.  Last Pain:  Vitals:   02/06/19 0944  TempSrc:   PainSc: 2       Patients Stated Pain Goal: 3 (52/08/02 2336)  Complications: No apparent anesthesia complications

## 2019-02-06 NOTE — Anesthesia Procedure Notes (Signed)
Procedure Name: Intubation Date/Time: 02/06/2019 11:25 AM Performed by: Orlie Dakin, CRNA Pre-anesthesia Checklist: Patient identified, Emergency Drugs available, Suction available and Patient being monitored Patient Re-evaluated:Patient Re-evaluated prior to induction Oxygen Delivery Method: Circle system utilized Preoxygenation: Pre-oxygenation with 100% oxygen Induction Type: IV induction Ventilation: Mask ventilation without difficulty Laryngoscope Size: Glidescope and 4 Grade View: Grade I Tube type: Oral Tube size: 7.5 mm Number of attempts: 1 Airway Equipment and Method: Stylet and Video-laryngoscopy Placement Confirmation: ETT inserted through vocal cords under direct vision,  positive ETCO2 and breath sounds checked- equal and bilateral Secured at: 23 cm Tube secured with: Tape Dental Injury: Teeth and Oropharynx as per pre-operative assessment  Comments: Electively used Glidescope due to H/O cervical neck fusion, discomfort with neck movement.  4x4s bite block used.

## 2019-02-06 NOTE — H&P (Signed)
Subjective: The patient is a 58 year old white male on whom I previously performed an anterior cervical discectomy, fusion and plating for cervical myelopathy.  He did well after the surgery but has complained of back and leg pain.  He has failed medical management.  He was worked up with lumbar x-rays and lumbar MRI which demonstrated an L4-5 spondylolisthesis, facet arthropathy and spinal stenosis.  I discussed the various treatment options with him.  He has decided to proceed with surgery.  Past Medical History:  Diagnosis Date  . Bowel incontinence    due to cervical disc issue  . Depression    with pain  . Dyspnea    with  exertion and pain  . Hyperlipidemia   . Hypertension    2 years ago was on med for bp. hctz.  . Neuromuscular disorder ([place])    Spinal cord injury  . PTSD (post-traumatic stress disorder)   . Urine incontinence 01/2018   due to cervical issue    Past Surgical History:  Procedure Laterality Date  . ANTERIOR CERVICAL DECOMPRESSION/DISCECTOMY FUSION 4 LEVELS N/A 01/31/2018   Procedure: ANTERIOR CERVICAL DECOMPRESSION/DISCECTOMY FUSION, INTERBODY PROSTHESIS, PLATE/SCREWS CERVICAL THREE- CERVICAL FOUR, CERVICAL FOUR - CERVICAL FIVE, CERVICAL FIVE - CERVICAL SIX, CERVICAL SIX- CERVICAL SEVEN;  Surgeon: Newman Pies, MD;  Location: [facility];  Service: Neurosurgery;  Laterality: N/A;  ANTERIOR CERVICAL DECOMPRESSION/DISCECTOMY FUSION, INTERBODY PROSTHESIS, PLATE/SCREWS CER  . HAND SURGERY Right    "BB removal"  . SHOULDER SURGERY Left 2017   acl repair  . Wisdom teeth removal      No Known Allergies  Social History   Tobacco Use  . Smoking status: Current Every Day Smoker    Packs/day: 0.50    Years: 40.00    Pack years: 20.00  . Smokeless tobacco: Never Used  Substance Use Topics  . Alcohol use: Yes    Comment: Socially    Family History  Problem Relation Age of Onset  . Diabetes Father   . Colon polyps Father   . Heart attack Mother   . Alcoholism  Mother   . Hypertension Sister   . Colon cancer Neg Hx   . Esophageal cancer Neg Hx   . Rectal cancer Neg Hx   . Stomach cancer Neg Hx    Prior to Admission medications   Medication Sig Start Date End Date Taking? Authorizing Provider  acetaminophen (TYLENOL) 500 MG tablet Take 1,000 mg by mouth every 6 (six) hours as needed for moderate pain or headache.    Yes [provider]  Ascorbic Acid (VITAMIN C PO) Take 1 tablet by mouth 3 (three) times a week.   Yes [provider]  b complex vitamins tablet Take 1 tablet by mouth 3 (three) times a week.   Yes [provider]  baclofen (LIORESAL) 10 MG tablet TAKE ONE (1) TABLET BY MOUTH 3 TIMES DAILY(can take additional tab at night if needed) Patient taking differently: Take 10 mg by mouth See admin instructions. Take 10 mg by mouth 3 times daily, may take a fourth 10 mg dose at night as needed for muscle spasms 10/18/18  Yes Saguier, Percell Miller, PA-C  bismuth subsalicylate (PEPTO BISMOL) 262 MG/15ML suspension Take 30 mLs by mouth daily as needed for indigestion or diarrhea or loose stools.   Yes [provider]  ibuprofen (ADVIL) 200 MG tablet Take 800 mg by mouth every 6 (six) hours as needed for headache or moderate pain.   Yes [provider]  TURMERIC  PO Take 1 tablet by mouth 3 (three) times a week.   Yes [provider]  VITAMIN D PO Take 1 tablet by mouth 3 (three) times a week.   Yes [provider]  gabapentin (NEURONTIN) 100 MG capsule Take 1 capsule (100 mg total) by mouth at bedtime. Patient not taking: Reported on 01/28/2019 12/03/18   Saguier, Percell Miller, PA-C  HYDROcodone-acetaminophen (NORCO) 5-325 MG tablet Take 1 tablet by mouth every 6 (six) hours as needed for moderate pain. Patient not taking: Reported on 01/28/2019 12/16/18   Saguier, Percell Miller, PA-C  nicotine (NICODERM CQ - DOSED IN MG/24 HOURS) 21 mg/24hr patch Place 1 patch (21 mg total) onto the skin daily. Patient not  taking: Reported on 01/28/2019 05/23/18   Saguier, Percell Miller, PA-C  predniSONE (STERAPRED UNI-PAK 21 TAB) 5 MG (21) TBPK tablet Taper over 6 days Patient not taking: Reported on 01/28/2019 12/03/18   Saguier, Percell Miller, PA-C     Review of Systems  Positive ROS: As above  All other systems have been reviewed and were otherwise negative with the exception of those mentioned in the HPI and as above.  Objective: Vital signs in last 24 hours: Temp:  [98 F (36.7 C)] 98 F (36.7 C) (08/20 0920) Pulse Rate:  [86] 86 (08/20 0920) Resp:  [20] 20 (08/20 0920) BP: (147)/(85) 147/85 (08/20 0920) SpO2:  [100 %] 100 % (08/20 0920) Weight:  [102.1 kg] 102.1 kg (08/20 0920) Estimated body mass index is 28.89 kg/m as calculated from the following:   Height as of this encounter: 6\' 2"  (1.88 m).   Weight as of this encounter: 102.1 kg.   General Appearance: Alert Head: Normocephalic, without obvious abnormality, atraumatic Eyes: PERRL, conjunctiva/corneas clear, EOM's intact,    Ears: Normal  Throat: Normal  Neck: His anterior cervical incision is healing well.  He has a limited cervical range of motion. Back: unremarkable Lungs: Clear to auscultation bilaterally, respirations unlabored Heart: Regular rate and rhythm, no murmur, rub or gallop Abdomen: Soft, non-tender Extremities: Extremities normal, atraumatic, no cyanosis or edema Skin: unremarkable  NEUROLOGIC:   Mental status: alert and oriented,Motor Exam - grossly normal Sensory Exam - grossly normal Reflexes: Hyperreflexic Coordination - grossly normal Gait -unsteady Balance -unsteady Cranial Nerves: I: smell Not tested  II: visual acuity  OS: Normal  OD: Normal   II: visual fields Full to confrontation  II: pupils Equal, round, reactive to light  III,VII: ptosis None  III,IV,VI: extraocular muscles  Full ROM  V: mastication Normal  V: facial light touch sensation  Normal  V,VII: corneal reflex  Present  VII: facial muscle  function - upper  Normal  VII: facial muscle function - lower Normal  VIII: hearing Not tested  IX: soft palate elevation  Normal  IX,X: gag reflex Present  XI: trapezius strength  5/5  XI: sternocleidomastoid strength 5/5  XI: neck flexion strength  5/5  XII: tongue strength  Normal    Data Review Lab Results  Component Value Date   WBC 14.7 (H) 02/03/2019   HGB 14.1 02/03/2019   HCT 42.7 02/03/2019   MCV 97.5 02/03/2019   PLT 321 02/03/2019   Lab Results  Component Value Date   NA 140 02/03/2019   K 4.5 02/03/2019   CL 108 02/03/2019   CO2 24 02/03/2019   BUN 11 02/03/2019   CREATININE 0.95 02/03/2019   GLUCOSE 99 02/03/2019   No results found for: INR, PROTIME  Assessment/Plan: Lumbar spondylolisthesis, facet arthropathy, spinal stenosis, lumbago,  lumbar radiculopathy, neurogenic claudication: I have discussed the situation with the patient.  I have reviewed his imaging studies with him and pointed out the abnormalities.  We have discussed the various treatment options including surgery.  I have described the surgical treatment option of an L4-5 decompression, instrumentation and fusion.  I have shown him surgical models.  I have given him a surgical pamphlet.  We have discussed the risks, benefits, alternatives, expected postoperative course, and likelihood of achieving our goals with surgery.  I have answered all his questions.  He has decided to proceed with surgery.   Ophelia Charter 02/06/2019 10:32 AM

## 2019-02-06 NOTE — Progress Notes (Signed)
Subjective: The patient is somnolent but arousable.  He is in no apparent distress.  He looks well.  Objective: Vital signs in last 24 hours: Temp:  [98 F (36.7 C)-98.4 F (36.9 C)] 98.4 F (36.9 C) (08/20 1510) Pulse Rate:  [86-96] 96 (08/20 1510) Resp:  [11-20] 11 (08/20 1510) BP: (123-147)/(71-85) 123/71 (08/20 1510) SpO2:  [97 %-100 %] 97 % (08/20 1510) Weight:  [102.1 kg] 102.1 kg (08/20 0920) Estimated body mass index is 28.89 kg/m as calculated from the following:   Height as of this encounter: 6\' 2"  (1.88 m).   Weight as of this encounter: 102.1 kg.   Intake/Output from previous day: No intake/output data recorded. Intake/Output this shift: Total I/O In: 2450 [I.V.:2200; IV Piggyback:250] Out: 280 [Urine:180; Blood:100]  Physical exam the patient is somnolent but arousable.  He is moving his lower extremities well.  Lab Results: No results for input(s): WBC, HGB, HCT, PLT in the last 72 hours. BMET No results for input(s): NA, K, CL, CO2, GLUCOSE, BUN, CREATININE, CALCIUM in the last 72 hours.  Studies/Results: Dg Lumbar Spine 1 View  Result Date: 02/06/2019 CLINICAL DATA:  58 year old male with localization of L4-5 in the operator. EXAM: LUMBAR SPINE - 1 VIEW COMPARISON:  Lumbar spine radiograph dated 01/01/2019 and MRI dated 01/12/2019 FINDINGS: The tip of the operative instrument is in the interspinous space between the L3 and L4 spinous processes. Lower lumbar facet arthropathy noted. There is atherosclerotic calcification of the aorta. IMPRESSION: The tip of the operative instrument is in the interspinous space between the L3 and L4 spinous processes. Electronically Signed   By: Anner Crete M.D.   On: 02/06/2019 13:36    Assessment/Plan: The patient is doing well.  I spoke with his father.  LOS: 0 days     Ophelia Charter 02/06/2019, 3:17 PM

## 2019-02-06 NOTE — Op Note (Signed)
Brief history: The patient is a 58 year old white male with a history of cervical myelopathy.  He has had an anterior cervicectomy fusion and plating and has done well.  He is developed back and leg pain consistent with neurogenic claudication.  He has failed medical management and was worked up with a lumbar MRI and lumbar x-rays.  This demonstrated an L4-5 spondylolisthesis, synovial cyst, spinal stenosis, etc.  I discussed the various treatment options with him.  He has weighed the risks, benefits and alternatives of surgery and decided to proceed with an L4-5 decompression, instrumentation, and fusion.  Preoperative diagnosis: L4-5 spondylolisthesis, synovial cyst, facet arthropathy, degenerative disc disease, spinal stenosis compressing both the L4 and the L5 nerve roots; lumbago; lumbar radiculopathy; neurogenic claudication  Postoperative diagnosis: The same  Procedure: Bilateral L4-5 laminotomy/foraminotomies/medial facetectomy to decompress the bilateral L4 and L5 nerve roots(the work required to do this was in addition to the work required to do the posterior lumbar interbody fusion because of the patient's synovial cyst, spinal stenosis, facet arthropathy. Etc. requiring a wide decompression of the nerve roots.);  L4-5 transforaminal lumbar interbody fusion with local morselized autograft bone and Zimmer DBM; insertion of interbody prosthesis at L4-5 (globus peek expandable interbody prosthesis); posterior nonsegmental instrumentation from L4 to L5 with globus titanium pedicle screws and rods; posterior lateral arthrodesis at L4-5 bilaterally with local morselized autograft bone and Zimmer DBM.  Surgeon: Dr. Earle Gell  Asst.: Dr. Erline Levine  Anesthesia: Gen. endotracheal  Estimated blood loss: 200 cc  Drains: None  Complications: None  Description of procedure: The patient was brought to the operating room by the anesthesia team. General endotracheal anesthesia was induced. The  patient was turned to the prone position on the Wilson frame. The patient's lumbosacral region was then prepared with Betadine scrub and Betadine solution. Sterile drapes were applied.  I then injected the area to be incised with Marcaine with epinephrine solution. I then used the scalpel to make a linear midline incision over the L4-5 interspace. I then used electrocautery to perform a bilateral subperiosteal dissection exposing the spinous process and lamina of L4 and L5. We then obtained intraoperative radiograph to confirm our location. We then inserted the Verstrac retractor to provide exposure.  I began the decompression by using the high speed drill to perform laminotomies at L4-5 bilaterally. We then used the Kerrison punches to widen the laminotomy and removed the ligamentum flavum at L4-5 bilaterally.  We encountered a synovial cyst as expected on the right compressing the exiting L4 nerve root.  We freed up from the nerve root and removed it with a Kerrison punches.  We used the Kerrison punches to remove the medial facets at L4-5 bilaterally. We performed wide foraminotomies about the bilateral L4 and L5 nerve roots completing the decompression.  We now turned our attention to the posterior lumbar interbody fusion. I used a scalpel to incise the intervertebral disc at L4-5 bilaterally. I then performed a partial intervertebral discectomy at L4-5 bilaterally using the pituitary forceps. We prepared the vertebral endplates at J6-7 bilaterally for the fusion by removing the soft tissues with the curettes. We then used the trial spacers to pick the appropriate sized interbody prosthesis. We prefilled his prosthesis with a combination of local morselized autograft bone that we obtained during the decompression as well as Zimmer DBM. We inserted the prefilled prosthesis into the interspace at L4-5 from the right, we then turned and expanded the prosthesis. There was a good snug fit of the  prosthesis in  the interspace. We then filled and the remainder of the intervertebral disc space with local morselized autograft bone and Zimmer DBM. This completed the posterior lumbar interbody arthrodesis.  We now turned attention to the instrumentation. Under fluoroscopic guidance we cannulated the bilateral L4 and L5 pedicles with the bone probe. We then removed the bone probe. We then tapped the pedicle with a 6.5 millimeter tap. We then removed the tap. We probed inside the tapped pedicle with a ball probe to rule out cortical breaches. We then inserted a 7.5 x 50 millimeter pedicle screw into the L4 and L5 pedicles bilaterally under fluoroscopic guidance. We then palpated along the medial aspect of the pedicles to rule out cortical breaches. There were none. The nerve roots were not injured. We then connected the unilateral pedicle screws with a lordotic rod. We compressed the construct and secured the rod in place with the caps. We then tightened the caps appropriately. This completed the instrumentation from L4-5 bilaterally.  We now turned our attention to the posterior lateral arthrodesis at L4-5 bilaterally. We used the high-speed drill to decorticate the remainder of the facets, pars, transverse process at L4-5 bilaterally. We then applied a combination of local morselized autograft bone and Zimmer DBM over these decorticated posterior lateral structures. This completed the posterior lateral arthrodesis.  We then obtained hemostasis using bipolar electrocautery. We irrigated the wound out with bacitracin solution. We inspected the thecal sac and nerve roots and noted they were well decompressed. We then removed the retractor. We injected Exparel . We reapproximated patient's thoracolumbar fascia with interrupted #1 Vicryl suture. We reapproximated patient's subcutaneous tissue with interrupted 2-0 Vicryl suture. The reapproximated patient's skin with Steri-Strips and benzoin. The wound was then coated with  bacitracin ointment. A sterile dressing was applied. The drapes were removed. The patient was subsequently returned to the supine position where they were extubated by the anesthesia team. He was then transported to the post anesthesia care unit in stable condition. All sponge instrument and needle counts were reportedly correct at the end of this case.

## 2019-02-06 NOTE — Anesthesia Postprocedure Evaluation (Signed)
Anesthesia Post Note  Patient: Bryan Lane.  Procedure(s) Performed: POSTERIOR LUMBAR INTERBODY FUSION, INTERBODY PROSTHESIS,POSTERIOR INSTRUMENTATION LUMBAR FOUR- LUMBAR FIVE (N/A Spine Lumbar)     Patient location during evaluation: PACU Anesthesia Type: General Level of consciousness: awake and alert Pain management: pain level controlled Vital Signs Assessment: post-procedure vital signs reviewed and stable Respiratory status: spontaneous breathing, nonlabored ventilation, respiratory function stable and patient connected to nasal cannula oxygen Cardiovascular status: blood pressure returned to baseline and stable Postop Assessment: no apparent nausea or vomiting Anesthetic complications: no    Last Vitals:  Vitals:   02/06/19 1555 02/06/19 1620  BP: 131/82 (!) 132/96  Pulse: 96 91  Resp: 13 18  Temp: 36.4 C 36.7 C  SpO2: 95% 97%    Last Pain:  Vitals:   02/06/19 1620  TempSrc: Oral  PainSc:                  [name]

## 2019-02-06 NOTE — Anesthesia Preprocedure Evaluation (Addendum)
Anesthesia Evaluation  Patient identified by MRN, date of birth, ID band Patient awake    Reviewed: Allergy & Precautions, NPO status , Patient's Chart, lab work & pertinent test results  Airway Mallampati: II  TM Distance: >3 FB Neck ROM: Full    Dental no notable dental hx. (+) Teeth Intact   Pulmonary Current Smoker,    Pulmonary exam normal breath sounds clear to auscultation       Cardiovascular Exercise Tolerance: Good hypertension, Pt. on medications negative cardio ROS Normal cardiovascular exam Rhythm:Regular Rate:Normal     Neuro/Psych PSYCHIATRIC DISORDERS Anxiety Hx of PTSD Neuromuscular disease    GI/Hepatic negative GI ROS, Neg liver ROS,   Endo/Other  negative endocrine ROS  Renal/GU negative Renal ROS     Musculoskeletal   Abdominal   Peds  Hematology negative hematology ROS (+)   Anesthesia Other Findings   Reproductive/Obstetrics                            Lab Results  Component Value Date   WBC 14.7 (H) 02/03/2019   HGB 14.1 02/03/2019   HCT 42.7 02/03/2019   MCV 97.5 02/03/2019   PLT 321 02/03/2019   Lab Results  Component Value Date   CREATININE 0.95 02/03/2019   BUN 11 02/03/2019   NA 140 02/03/2019   K 4.5 02/03/2019   CL 108 02/03/2019   CO2 24 02/03/2019     Anesthesia Physical Anesthesia Plan  ASA: III  Anesthesia Plan:    Post-op Pain Management:    Induction:   PONV Risk Score and Plan: Treatment may vary due to age or medical condition, Ondansetron and Dexamethasone  Airway Management Planned: Video Laryngoscope Planned and Oral ETT  Additional Equipment:   Intra-op Plan:   Post-operative Plan: Extubation in OR  Informed Consent: I have reviewed the patients History and Physical, chart, labs and discussed the procedure including the risks, benefits and alternatives for the proposed anesthesia with the patient or authorized  representative who has indicated his/her understanding and acceptance.     Dental advisory given  Plan Discussed with:   Anesthesia Plan Comments:        Anesthesia Quick Evaluation

## 2019-02-06 NOTE — Progress Notes (Signed)
Orthopedic Tech Progress Note Patient Details:  Foye Damron January 03, 1961 [id number] Called in order to [facility] for a [place] Patient ID: Caswell Alvillar., male   DOB: 07-Dec-1960, 58 y.o.   MRN: [id number]   Janit Pagan 02/06/2019, 3:14 PM

## 2019-02-07 LAB — BASIC METABOLIC PANEL
Anion gap: 9 (ref 5–15)
BUN: 13 mg/dL (ref 6–20)
CO2: 22 mmol/L (ref 22–32)
Calcium: 8.9 mg/dL (ref 8.9–10.3)
Chloride: 104 mmol/L (ref 98–111)
Creatinine, Ser: 0.98 mg/dL (ref 0.61–1.24)
GFR calc Af Amer: >60 mL/min (ref >60)
GFR calc non Af Amer: >60 mL/min (ref >60)
Glucose, Bld: 144 mg/dL — ABNORMAL HIGH (ref 70–99)
Potassium: 4.3 mmol/L (ref 3.5–5.1)
Sodium: 135 mmol/L (ref 135–145)

## 2019-02-07 LAB — CBC
HCT: 35.7 % — ABNORMAL LOW (ref 39.0–52.0)
Hemoglobin: 12.1 g/dL — ABNORMAL LOW (ref 13.0–17.0)
MCH: 32.1 pg (ref 26.0–34.0)
MCHC: 33.9 g/dL (ref 30.0–36.0)
MCV: 94.7 fL (ref 80.0–100.0)
Platelets: 266 10*3/uL (ref 150–400)
RBC: 3.77 MIL/uL — ABNORMAL LOW (ref 4.22–5.81)
RDW: 13.9 % (ref 11.5–15.5)
WBC: 24.1 10*3/uL — ABNORMAL HIGH (ref 4.0–10.5)
nRBC: 0 % (ref 0.0–0.2)

## 2019-02-07 MED ORDER — OXYCODONE HCL 5 MG PO TABS: 5.0000 mg | ORAL_TABLET | ORAL | 0 refills | Status: DC | PRN

## 2019-02-07 MED ORDER — DOCUSATE SODIUM 100 MG PO CAPS: 100.0000 mg | ORAL_CAPSULE | Freq: Two times a day (BID) | ORAL | 0 refills | Status: DC

## 2019-02-07 NOTE — Plan of Care (Signed)
Patient alert and oriented, mae's well, voiding adequate amount of urine, swallowing without difficulty, no c/o pain at time of discharge. Patient discharged home with family. Script and discharged instructions given to patient. Patient and family stated understanding of instructions given. Patient has an appointment with Dr. Jenkins   

## 2019-02-07 NOTE — Discharge Summary (Signed)
Physician Discharge Summary  Patient ID: Bryan Lane. MRN: [id number] DOB/AGE: 20-Apr-1961 58 y.o.  Admit date: 02/06/2019 Discharge date: 02/07/2019  Admission Diagnoses: Lumbar spondylolisthesis, lumbar stenosis, lumbar facet arthropathy, lumbar radiculopathy, lumbago, neurogenic claudication  Discharge Diagnoses: The same Active Problems:   Spondylolisthesis of lumbar region   Discharged Condition: good  Hospital Course: I performed an L4-5 decompression, instrumentation and fusion on the patient on 02/06/2019.  The surgery went well.  On postoperative day #1 the patient quested discharge to home.  His leg pain and numbness was gone.  He was given written and oral discharge instructions.  All his questions were answered.  Consults: Physical therapy, Occupational Therapy, care management Significant Diagnostic Studies: None Treatments: L4-5 decompression, instrumentation and fusion. Discharge Exam: Blood pressure (!) 143/80, pulse 93, temperature 98.7 F (37.1 C), temperature source Oral, resp. rate 20, height 6\' 2"  (1.88 m), weight 102.1 kg, SpO2 94 %. The patient is alert and pleasant.  His lower extremity strength is normal.  He looks well.  Disposition: Home  Discharge Instructions    Call MD for:  difficulty breathing, headache or visual disturbances   Complete by: As directed    Call MD for:  extreme fatigue   Complete by: As directed    Call MD for:  hives   Complete by: As directed    Call MD for:  persistant dizziness or light-headedness   Complete by: As directed    Call MD for:  persistant nausea and vomiting   Complete by: As directed    Call MD for:  redness, tenderness, or signs of infection (pain, swelling, redness, odor or green/yellow discharge around incision site)   Complete by: As directed    Call MD for:  severe uncontrolled pain   Complete by: As directed    Call MD for:  temperature >100.4   Complete by: As directed    Diet - low sodium heart  healthy   Complete by: As directed    Discharge instructions   Complete by: As directed    Call [phone] for a followup appointment. Take a stool softener while you are using pain medications.   Driving Restrictions   Complete by: As directed    Do not drive for 2 weeks.   Increase activity slowly   Complete by: As directed    Lifting restrictions   Complete by: As directed    Do not lift more than 5 pounds. No excessive bending or twisting.   May shower / Bathe   Complete by: As directed    Remove the dressing for 3 days after surgery.  You may shower, but leave the incision alone.   Remove dressing in 48 hours   Complete by: As directed    Your stitches are under the scan and will dissolve by themselves. The Steri-Strips will fall off after you take a few showers. Do not rub back or pick at the wound, Leave the wound alone.     Allergies as of 02/07/2019   No Known Allergies     Medication List    STOP taking these medications   acetaminophen 500 MG tablet Commonly known as: TYLENOL   HYDROcodone-acetaminophen 5-325 MG tablet Commonly known as: Norco   ibuprofen 200 MG tablet Commonly known as: ADVIL   nicotine 21 mg/24hr patch Commonly known as: NICODERM CQ - dosed in mg/24 hours   predniSONE 5 MG (21) Tbpk tablet Commonly known as: STERAPRED UNI-PAK 21 TAB  TAKE these medications   b complex vitamins tablet Take 1 tablet by mouth 3 (three) times a week.   baclofen 10 MG tablet Commonly known as: LIORESAL TAKE ONE (1) TABLET BY MOUTH 3 TIMES DAILY(can take additional tab at night if needed) What changed:   how much to take  how to take this  when to take this  additional instructions   bismuth subsalicylate [id number] suspension Commonly known as: PEPTO BISMOL Take 30 mLs by mouth daily as needed for indigestion or diarrhea or loose stools.   docusate sodium 100 MG capsule Commonly known as: COLACE Take 1 capsule (100 mg total) by mouth 2  (two) times daily.   gabapentin 100 MG capsule Commonly known as: NEURONTIN Take 1 capsule (100 mg total) by mouth at bedtime.   oxyCODONE 5 MG immediate release tablet Commonly known as: Oxy IR/ROXICODONE Take 1 tablet (5 mg total) by mouth every 4 (four) hours as needed for moderate pain ((score 4 to 6)).   TURMERIC PO Take 1 tablet by mouth 3 (three) times a week.   VITAMIN C PO Take 1 tablet by mouth 3 (three) times a week.   VITAMIN D PO Take 1 tablet by mouth 3 (three) times a week.        Signed: Ophelia Charter 02/07/2019, 6:44 AM

## 2019-02-07 NOTE — Evaluation (Signed)
Physical Therapy Evaluation Patient Details Name: Bryan Lane. MRN: [id number] DOB: 07/26/1960 Today's Date: 02/07/2019   History of Present Illness  Pt is a 58 y.o. male s/p PLIF. PMH consists of s/p C3-7 ACDF 01/2018, HTN, and PTSD.  Clinical Impression  PT eval complete. Pt required min guard assist bed mobility, supervision transfers, supervision ambulation 300 feet without AD, and min guard assist ascend/descend 1 flight of stairs with L rail. Pt educated on 3/3 back precautions and brace wear schedule. Plan is for d/c home today. No follow up services or DME needed. PT signing off.    Follow Up Recommendations No PT follow up;Supervision for mobility/OOB    Equipment Recommendations  None recommended by PT    Recommendations for Other Services       Precautions / Restrictions Precautions Precautions: Back Precaution Comments: Pt educated on 3/3 back precautions. Required Braces or Orthoses: Spinal Brace Spinal Brace: Lumbar corset;Applied in sitting position      Mobility  Bed Mobility Overal bed mobility: Needs Assistance Bed Mobility: Rolling;Sidelying to Sit;Sit to Sidelying Rolling: Modified independent (Device/Increase time) Sidelying to sit: Min guard     Sit to sidelying: Min guard General bed mobility comments: HOB flat, no rails, increased time and effort, cues for sequencing  Transfers Overall transfer level: Needs assistance Equipment used: None Transfers: Sit to/from Stand Sit to Stand: Supervision         General transfer comment: increased time and effort, no physical assist  Ambulation/Gait Ambulation/Gait assistance: Supervision Gait Distance (Feet): 300 Feet Assistive device: None Gait Pattern/deviations: Step-through pattern;Decreased stride length Gait velocity: decreased Gait velocity interpretation: 1.31 - 2.62 ft/sec, indicative of limited community ambulator General Gait Details: steady gait  Stairs Stairs: Yes Stairs  assistance: Min guard Stair Management: One rail Left;Step to pattern;Forwards Number of Stairs: 12    Wheelchair Mobility    Modified Rankin (Stroke Patients Only)       Balance Overall balance assessment: Mild deficits observed, not formally tested                                           Pertinent Vitals/Pain Pain Assessment: 0-10 Pain Score: 5  Pain Location: sx site Pain Descriptors / Indicators: Grimacing;Operative site guarding Pain Intervention(s): Monitored during session    Home Living Family/patient expects to be discharged to:: Private residence Living Arrangements: Parent Available Help at Discharge: Family;Available 24 hours/day Type of Home: House Home Access: Stairs to enter Entrance Stairs-Rails: Psychiatric nurse of Steps: 3 Home Layout: One level Home Equipment: None      Prior Function Level of Independence: Independent               Hand Dominance   Dominant Hand: Right    Extremity/Trunk Assessment   Upper Extremity Assessment Upper Extremity Assessment: Defer to OT evaluation         Cervical / Trunk Assessment Cervical / Trunk Assessment: Other exceptions Cervical / Trunk Exceptions: s/p PLIF, h/o ACDF  Communication   Communication: No difficulties  Cognition Arousal/Alertness: Awake/alert Behavior During Therapy: WFL for tasks assessed/performed Overall Cognitive Status: Within Functional Limits for tasks assessed                                        General Comments  Exercises     Assessment/Plan    PT Assessment Patent does not need any further PT services  PT Problem List         PT Treatment Interventions      PT Goals (Current goals can be found in the Care Plan section)  Acute Rehab PT Goals Patient Stated Goal: home today PT Goal Formulation: All assessment and education complete, DC therapy    Frequency     Barriers to discharge         Co-evaluation               AM-PAC PT "6 Clicks" Mobility  Outcome Measure Help needed turning from your back to your side while in a flat bed without using bedrails?: None Help needed moving from lying on your back to sitting on the side of a flat bed without using bedrails?: None Help needed moving to and from a bed to a chair (including a wheelchair)?: None Help needed standing up from a chair using your arms (e.g., wheelchair or bedside chair)?: None Help needed to walk in hospital room?: A Little Help needed climbing 3-5 steps with a railing? : A Little 6 Click Score: 22    End of Session Equipment Utilized During Treatment: Gait belt;Back brace Activity Tolerance: Patient tolerated treatment well Patient left: in bed;with call bell/phone within reach Nurse Communication: Mobility status PT Visit Diagnosis: Difficulty in walking, not elsewhere classified (R26.2);Pain    Time: [id number] PT Time Calculation (min) (ACUTE ONLY): 19 min   Charges:   PT Evaluation $PT Eval Moderate Complexity: 1 Mod          Lorrin Goodell, PT  Office # [phone] Pager [phone]   Lorriane Shire 02/07/2019, 8:50 AM

## 2019-02-07 NOTE — Evaluation (Signed)
Occupational Therapy Evaluation and discharge Patient Details Name: Bryan Lane. MRN: [id number] DOB: March 19, 1961 Today's Date: 02/07/2019    History of Present Illness Pt is a 58 y.o. male s/p PLIF. PMH consists of s/p C3-7 ACDF 01/2018, HTN, and PTSD.   Clinical Impression   OT eval completed with no further acute OT needs identified at this time. Pt completing functional mobility without AD, LB ADL at minguard assist level; demonstrating seated UB and standing grooming ADL with supervision. Educated pt re: back precautions, brace management, AE, safety and compensatory techniques for completing ADL and functional transfers with pt verbalizing/return demonstrating understanding. Pt reports plans to return home with parents who can lightly assist with iADL/ADL tasks. Questions answered throughout with no further acute OT needs identified. Pt anticipating d/c home today. Acute OT to sign off, thank you for this referral.     Follow Up Recommendations  No OT follow up;Supervision - Intermittent    Equipment Recommendations  None recommended by OT    Recommendations for Other Services       Precautions / Restrictions Precautions Precautions: Back Precaution Booklet Issued: Yes (comment) Precaution Comments: issued and reviewed 3/3 back precautions Required Braces or Orthoses: Spinal Brace Spinal Brace: Lumbar corset;Applied in sitting position Restrictions Weight Bearing Restrictions: No      Mobility Bed Mobility Overal bed mobility: Modified Independent Bed Mobility: Rolling;Sidelying to Sit;Sit to Sidelying Rolling: Modified independent (Device/Increase time) Sidelying to sit: Min guard     Sit to sidelying: Min guard General bed mobility comments: pt requiring increased time/effort but able to perform from flat bed without use of handrail   Transfers Overall transfer level: Needs assistance Equipment used: None Transfers: Sit to/from Stand Sit to Stand:  Supervision         General transfer comment: increased time and effort, no physical assist    Balance Overall balance assessment: Mild deficits observed, not formally tested                                         ADL either performed or assessed with clinical judgement   ADL Overall ADL's : Needs assistance/impaired Eating/Feeding: Modified independent;Sitting   Grooming: Wash/dry hands;Supervision/safety;Standing   Upper Body Bathing: Supervision/ safety;Sitting   Lower Body Bathing: Min guard;Sit to/from stand Lower Body Bathing Details (indicate cue type and reason): pt reports plans to sponge bathe initially Upper Body Dressing : Set up;Sitting Upper Body Dressing Details (indicate cue type and reason): donning overhead shirt, lumbar brace Lower Body Dressing: Min guard;Sit to/from stand Lower Body Dressing Details (indicate cue type and reason): requires increased time/effort to perform and utilize figure 4 technique but pt is able to do so; donning pants, underwear, and shoes; also educated in method of using reacher, [employer] shoe horn for ADL task Toilet Transfer: Min guard;Supervision/safety;Ambulation Toilet Transfer Details (indicate cue type and reason): simulated via transfer to recliner, room/hallway level mobility Toileting- Clothing Manipulation and Hygiene: Min guard;Sit to/from Nurse, children's Details (indicate cue type and reason): pt reports plans to sponge bathe initially for increased safety Functional mobility during ADLs: Min guard General ADL Comments: educated pt re: back precautions, brace management, AE, safety and compensatory techniques for completing ADL and functional transfers     Vision         Perception     Praxis      Pertinent  Vitals/Pain Pain Assessment: Faces Pain Score: 5  Faces Pain Scale: Hurts little more Pain Location: sx site Pain Descriptors / Indicators: Grimacing;Operative site guarding Pain  Intervention(s): Repositioned;Limited activity within patient's tolerance;Monitored during session     Hand Dominance Right   Extremity/Trunk Assessment Upper Extremity Assessment Upper Extremity Assessment: Overall WFL for tasks assessed   Lower Extremity Assessment Lower Extremity Assessment: Defer to PT evaluation   Cervical / Trunk Assessment Cervical / Trunk Assessment: Other exceptions Cervical / Trunk Exceptions: s/p PLIF, h/o ACDF   Communication Communication Communication: No difficulties   Cognition Arousal/Alertness: Awake/alert Behavior During Therapy: WFL for tasks assessed/performed Overall Cognitive Status: Within Functional Limits for tasks assessed                                     General Comments       Exercises     Shoulder Instructions      Home Living Family/patient expects to be discharged to:: Private residence Living Arrangements: Parent Available Help at Discharge: Family;Available 24 hours/day Type of Home: House Home Access: Stairs to enter [employer] of Steps: 3 Entrance Stairs-Rails: Right;Left Home Layout: One level     Bathroom Shower/Tub: Teacher, early years/pre: Standard     Home Equipment: None          Prior Functioning/Environment Level of Independence: Independent                 OT Problem List: Decreased range of motion;Decreased strength;Decreased activity tolerance;Impaired balance (sitting and/or standing);Decreased knowledge of precautions;Decreased knowledge of use of DME or AE;Pain      OT Treatment/Interventions:      OT Goals(Current goals can be found in the care plan section) Acute Rehab OT Goals Patient Stated Goal: home today OT Goal Formulation: All assessment and education complete, DC therapy  OT Frequency:     Barriers to D/C:            Co-evaluation              AM-PAC OT "6 Clicks" Daily Activity     Outcome Measure Help from another  person eating meals?: None Help from another person taking care of personal grooming?: None Help from another person toileting, which includes using toliet, bedpan, or urinal?: None Help from another person bathing (including washing, rinsing, drying)?: A Little Help from another person to put on and taking off regular upper body clothing?: None Help from another person to put on and taking off regular lower body clothing?: A Little 6 Click Score: 22   End of Session Equipment Utilized During Treatment: Back brace Nurse Communication: Mobility status  Activity Tolerance: Patient tolerated treatment well Patient left: in chair;with call bell/phone within reach  OT Visit Diagnosis: Other abnormalities of gait and mobility (R26.89);Pain Pain - part of body: (back)                Time: [id number] OT Time Calculation (min): 23 min Charges:  OT General Charges $OT Visit: 1 Visit OT Evaluation $OT Eval Low Complexity: 1 Low OT Treatments $Self Care/Home Management : 8-22 mins  Lou Cal, OT [facility] Pager [phone] Office [place] 02/07/2019, 9:59 AM

## 2019-02-10 MED FILL — Sodium Chloride IV Soln 0.9%: INTRAVENOUS | Qty: 1000 | Status: AC

## 2019-02-10 MED FILL — Heparin Sodium (Porcine) Inj 1000 Unit/ML: INTRAMUSCULAR | Qty: 30 | Status: AC

## 2019-02-11 ENCOUNTER — Encounter (HOSPITAL_COMMUNITY): Payer: Self-pay | Admitting: Neurosurgery

## 2019-02-27 ENCOUNTER — Other Ambulatory Visit: Payer: Self-pay | Admitting: Medical

## 2019-03-24 ENCOUNTER — Telehealth: Payer: Self-pay | Admitting: Medical

## 2019-03-24 NOTE — Telephone Encounter (Signed)
I put medical record info sheet for disability records. Have not seen pt in a while. If you could send those record request off. Will you call pt and offer virtual visit. Also would you mind offering flu vaccine.

## 2019-05-02 ENCOUNTER — Ambulatory Visit

## 2019-05-02 ENCOUNTER — Ambulatory Visit (INDEPENDENT_AMBULATORY_CARE_PROVIDER_SITE_OTHER): Admitting: Medical

## 2019-05-02 ENCOUNTER — Encounter: Payer: Self-pay | Admitting: Medical

## 2019-05-02 ENCOUNTER — Ambulatory Visit (INDEPENDENT_AMBULATORY_CARE_PROVIDER_SITE_OTHER)

## 2019-05-02 ENCOUNTER — Other Ambulatory Visit: Payer: Self-pay

## 2019-05-02 VITALS — BP 162/99

## 2019-05-02 DIAGNOSIS — R739 Hyperglycemia, unspecified: Secondary | ICD-10-CM | POA: Diagnosis not present

## 2019-05-02 DIAGNOSIS — I10 Essential (primary) hypertension: Secondary | ICD-10-CM

## 2019-05-02 DIAGNOSIS — Z23 Encounter for immunization: Secondary | ICD-10-CM | POA: Diagnosis not present

## 2019-05-02 DIAGNOSIS — M62838 Other muscle spasm: Secondary | ICD-10-CM

## 2019-05-02 DIAGNOSIS — M6281 Muscle weakness (generalized): Secondary | ICD-10-CM

## 2019-05-02 MED ORDER — CHLORTHALIDONE 25 MG PO TABS: 25.0000 mg | ORAL_TABLET | Freq: Every day | ORAL | 3 refills | Status: DC

## 2019-05-02 NOTE — Progress Notes (Signed)
Subjective:    Patient ID: Bryan Lane., male    DOB: 02-21-1961, 58 y.o.   MRN: [id number]  HPI  Virtual Visit via Video Note  I connected with Bryan Lane. on 05/02/19 at  3:00 PM EST by a video enabled telemedicine application and verified that I am speaking with the correct person using two identifiers.  Location: Patient: home Provider: office   I discussed the limitations of evaluation and management by telemedicine and the availability of in person appointments. The patient expressed understanding and agreed to proceed.  History of Present Illness:  Pt in lower back post lumbar spine fusion is less. Sore type pain in lower back. Not having crepitus. Pt states for 2 weeks he had severe spasms of lower legs post surgery. Pt states he still has some lower leg spasms. He has difficulty sleeping due to spasm.  Pt states not sleeping.  Pt had some spasm of both upper and lower ext since neck surgery.  Pt is not sleeping well for months know.   Pt states when he exercises muscle spasms are worse.  Pt bp was high in the past. Was borderline intermittently in the past I had wanted him to start bp medication in the past and he declined.  No gross motor or sensory function deficits.      Observations/Objective: General-no acute distress, pleasant, oriented. Lungs- on inspection lungs appear unlabored. Neck- no tracheal deviation or jvd on inspection. Neuro- gross motor function appears intact.  Assessment and Plan: For upper and lower ext muscle spasms will continue with baclofen. Follow recommendation of your specialist. If pain in back returns then would consider nsaid but would need bp to be better controlled.  For htn, want you to start chlorthalidone 25 mg daily. Make sure you eat potassium rich foods. Get cmp, lipid panel and a1c next Friday. If bp not coming down less than 140/90 then would add losartan to chlorthalidone.  Follow up 10-14 days or as  needed   Mackie Pai, PA-C  Follow Up Instructions:    I discussed the assessment and treatment plan with the patient. The patient was provided an opportunity to ask questions and all were answered. The patient agreed with the plan and demonstrated an understanding of the instructions.   The patient was advised to call back or seek an in-person evaluation if the symptoms worsen or if the condition fails to improve as anticipated.  I provided 25 minutes of non-face-to-face time during this encounter.   Mackie Pai, PA-C    Review of Systems  Constitutional: Negative for chills, fatigue and fever.  Respiratory: Negative for chest tightness, shortness of breath and wheezing.   Cardiovascular: Negative for chest pain and palpitations.  Gastrointestinal: Negative for abdominal pain, blood in stool, diarrhea, nausea and vomiting.  Musculoskeletal: Negative for back pain, joint swelling and neck pain.       Upper and lower ext spasms daily basis.  Both upper and lower ext weakness.  Fine motor coordination still off per pt.  Skin: Negative for rash.  Neurological: Negative for dizziness, syncope, speech difficulty, weakness, numbness and headaches.  Hematological: Negative for adenopathy. Does not bruise/bleed easily.  Psychiatric/Behavioral: Negative for behavioral problems, confusion, dysphoric mood, self-injury and suicidal ideas. The patient is not nervous/anxious and is not hyperactive.     Past Medical History:  Diagnosis Date  . Bowel incontinence    due to cervical disc issue  . Depression    with pain  .  Dyspnea    with  exertion and pain  . Hyperlipidemia   . Hypertension    2 years ago was on med for bp. hctz.  . Neuromuscular disorder ([place])    Spinal cord injury  . PTSD (post-traumatic stress disorder)   . Urine incontinence 01/2018   due to cervical issue     Social History   Socioeconomic History  . Marital status: Single    Spouse name: Not on  file  . Number of children: 2  . Years of education: 16  . Highest education level: Bachelor's degree (e.g., BA, AB, BS)  Occupational History  . Occupation: applying for disability  Social Needs  . Financial resource strain: Not on file  . Food insecurity    Worry: Not on file    Inability: Not on file  . Transportation needs    Medical: Not on file    Non-medical: Not on file  Tobacco Use  . Smoking status: Current Every Day Smoker    Packs/day: 0.50    Years: 40.00    Pack years: 20.00  . Smokeless tobacco: Never Used  Substance and Sexual Activity  . Alcohol use: Yes    Comment: Socially  . Drug use: Yes    Types: Marijuana    Comment: last 01/28/18   . Sexual activity: Not Currently  Lifestyle  . Physical activity    Days per week: Not on file    Minutes per session: Not on file  . Stress: Not on file  Relationships  . Social Herbalist on phone: Not on file    Gets together: Not on file    Attends religious service: Not on file    Active member of club or organization: Not on file    Attends meetings of clubs or organizations: Not on file    Relationship status: Not on file  . Intimate partner violence    Fear of current or ex partner: Not on file    Emotionally abused: Not on file    Physically abused: Not on file    Forced sexual activity: Not on file  Other Topics Concern  . Not on file  Social History Narrative   Lives with dad and stepmother in a one story home.  Has 2 children.     Applying for disability.  Education: BS    Past Surgical History:  Procedure Laterality Date  . ANTERIOR CERVICAL DECOMPRESSION/DISCECTOMY FUSION 4 LEVELS N/A 01/31/2018   Procedure: ANTERIOR CERVICAL DECOMPRESSION/DISCECTOMY FUSION, INTERBODY PROSTHESIS, PLATE/SCREWS CERVICAL THREE- CERVICAL FOUR, CERVICAL FOUR - CERVICAL FIVE, CERVICAL FIVE - CERVICAL SIX, CERVICAL SIX- CERVICAL SEVEN;  Surgeon: Newman Pies, MD;  Location: [facility];  Service: Neurosurgery;   Laterality: N/A;  ANTERIOR CERVICAL DECOMPRESSION/DISCECTOMY FUSION, INTERBODY PROSTHESIS, PLATE/SCREWS CER  . HAND SURGERY Right    "BB removal"  . SHOULDER SURGERY Left 2017   acl repair  . Wisdom teeth removal      Family History  Problem Relation Age of Onset  . Diabetes Father   . Colon polyps Father   . Heart attack Mother   . Alcoholism Mother   . Hypertension Sister   . Colon cancer Neg Hx   . Esophageal cancer Neg Hx   . Rectal cancer Neg Hx   . Stomach cancer Neg Hx     No Known Allergies  Current Outpatient Medications on File Prior to Visit  Medication Sig Dispense Refill  . Ascorbic Acid (VITAMIN C PO) Take  1 tablet by mouth 3 (three) times a week.    Marland Kitchen b complex vitamins tablet Take 1 tablet by mouth 3 (three) times a week.    . baclofen (LIORESAL) 10 MG tablet TAKE 1 TABLET BY MOUTH 3 TIMES A DAY. MAY TAKE ADDITIONAL TABLET AT NIGHT IF NEEDED. 120 each 3  . bismuth subsalicylate (PEPTO BISMOL) 262 MG/15ML suspension Take 30 mLs by mouth daily as needed for indigestion or diarrhea or loose stools.    . docusate sodium (COLACE) 100 MG capsule Take 1 capsule (100 mg total) by mouth 2 (two) times daily. 60 capsule 0  . gabapentin (NEURONTIN) 100 MG capsule Take 1 capsule (100 mg total) by mouth at bedtime. 30 capsule 0  . oxyCODONE (OXY IR/ROXICODONE) 5 MG immediate release tablet Take 1 tablet (5 mg total) by mouth every 4 (four) hours as needed for moderate pain ((score 4 to 6)). 30 tablet 0  . TURMERIC PO Take 1 tablet by mouth 3 (three) times a week.    Marland Kitchen VITAMIN D PO Take 1 tablet by mouth 3 (three) times a week.     No current facility-administered medications on file prior to visit.     BP (!) 174/123 Comment: Pt thinks his cuff is wrong      Objective:   Physical Exam  General-no acute distress, pleasant, oriented. Lungs- on inspection lungs appear unlabored. Neck- no tracheal deviation or jvd on inspection. Neuro- gross motor function appears  intact.      Assessment & Plan:

## 2019-05-02 NOTE — Patient Instructions (Signed)
For upper and lower ext muscle spasms will continue with baclofen. Follow recommendation of your specialist. If pain in back returns then would consider nsaid but would need bp to be better controlled.  For htn, want you to start chlorthalidone 25 mg daily. Make sure you eat potassium rich foods. Get cmp, lipid panel and a1c next Friday. If bp not coming down less than 140/90 then would add losartan to chlorthalidone.  Follow up 10-14 days or as needed

## 2019-05-02 NOTE — Progress Notes (Signed)
Pre visit review using our clinic review tool, if applicable. No additional management support is needed unless otherwise documented below in the visit note.  Patient here for flu vaccine. 0.5mL flu vaccine given in left deltoid IM. Patient tolerated well. VIS given.   

## 2019-05-12 ENCOUNTER — Other Ambulatory Visit (INDEPENDENT_AMBULATORY_CARE_PROVIDER_SITE_OTHER)

## 2019-05-12 ENCOUNTER — Other Ambulatory Visit: Payer: Self-pay

## 2019-05-12 DIAGNOSIS — R739 Hyperglycemia, unspecified: Secondary | ICD-10-CM

## 2019-05-12 DIAGNOSIS — I10 Essential (primary) hypertension: Secondary | ICD-10-CM

## 2019-05-12 LAB — LIPID PANEL
Cholesterol: 192 mg/dL (ref 0–200)
HDL: 34.8 mg/dL — ABNORMAL LOW (ref >39.00)
LDL Cholesterol: 143 mg/dL — ABNORMAL HIGH (ref 0–99)
NonHDL: 157.58
Total CHOL/HDL Ratio: 6
Triglycerides: 72 mg/dL (ref 0.0–149.0)
VLDL: 14.4 mg/dL (ref 0.0–40.0)

## 2019-05-12 LAB — COMPREHENSIVE METABOLIC PANEL
ALT: 35 U/L (ref 0–53)
AST: 24 U/L (ref 0–37)
Albumin: 4.1 g/dL (ref 3.5–5.2)
Alkaline Phosphatase: 83 U/L (ref 39–117)
BUN: 17 mg/dL (ref 6–23)
CO2: 29 mEq/L (ref 19–32)
Calcium: 10 mg/dL (ref 8.4–10.5)
Chloride: 100 mEq/L (ref 96–112)
Creatinine, Ser: 1.07 mg/dL (ref 0.40–1.50)
GFR: 71.41 mL/min (ref >60.00)
Glucose, Bld: 108 mg/dL — ABNORMAL HIGH (ref 70–99)
Potassium: 3.9 mEq/L (ref 3.5–5.1)
Sodium: 136 mEq/L (ref 135–145)
Total Bilirubin: 0.6 mg/dL (ref 0.2–1.2)
Total Protein: 6.6 g/dL (ref 6.0–8.3)

## 2019-05-12 LAB — HEMOGLOBIN A1C: Hgb A1c MFr Bld: 5.7 % (ref 4.6–6.5)

## 2019-05-13 ENCOUNTER — Telehealth: Payer: Self-pay | Admitting: Medical

## 2019-05-13 MED ORDER — ATORVASTATIN CALCIUM 10 MG PO TABS: 10.0000 mg | ORAL_TABLET | Freq: Every day | ORAL | 3 refills | Status: DC

## 2019-05-13 NOTE — Telephone Encounter (Signed)
Rx atorvastatin sent to pt pharmacy. 

## 2019-05-14 ENCOUNTER — Encounter: Payer: Self-pay | Admitting: Medical

## 2019-06-20 ENCOUNTER — Encounter: Payer: Self-pay | Admitting: Medical

## 2019-06-24 ENCOUNTER — Telehealth: Payer: Self-pay | Admitting: Medical

## 2019-06-24 DIAGNOSIS — M6281 Muscle weakness (generalized): Secondary | ICD-10-CM

## 2019-06-24 DIAGNOSIS — M62838 Other muscle spasm: Secondary | ICD-10-CM

## 2019-06-24 MED ORDER — LOSARTAN POTASSIUM 25 MG PO TABS: 25.0000 mg | ORAL_TABLET | Freq: Every day | ORAL | 3 refills | Status: DC

## 2019-06-24 NOTE — Telephone Encounter (Signed)
I put in referral to physical medicine. Will you see 3 internal office we use. Can you see which office could see pt the quickest?

## 2019-06-24 NOTE — Telephone Encounter (Signed)
Referral to physical medicine place.

## 2019-06-24 NOTE — Telephone Encounter (Signed)
Losartan rx sent to pt pharmacy.

## 2019-06-27 ENCOUNTER — Telehealth: Payer: Self-pay | Admitting: Medical

## 2019-06-27 MED ORDER — LOSARTAN POTASSIUM 25 MG PO TABS: 25.0000 mg | ORAL_TABLET | Freq: Every day | ORAL | 3 refills | Status: DC

## 2019-06-27 NOTE — Telephone Encounter (Signed)
Rx losartan sent to pt pharmacy again. Pt wanted me to send it although already sent so assuming they did not get rx or they did not notify him?

## 2019-07-05 ENCOUNTER — Encounter: Payer: Self-pay | Admitting: Medical

## 2019-07-07 ENCOUNTER — Emergency Department (HOSPITAL_BASED_OUTPATIENT_CLINIC_OR_DEPARTMENT_OTHER)

## 2019-07-07 ENCOUNTER — Telehealth: Payer: Self-pay | Admitting: Medical

## 2019-07-07 ENCOUNTER — Encounter (HOSPITAL_BASED_OUTPATIENT_CLINIC_OR_DEPARTMENT_OTHER): Payer: Self-pay

## 2019-07-07 ENCOUNTER — Encounter: Payer: Self-pay | Admitting: Medical

## 2019-07-07 ENCOUNTER — Ambulatory Visit (INDEPENDENT_AMBULATORY_CARE_PROVIDER_SITE_OTHER): Admitting: Medical

## 2019-07-07 ENCOUNTER — Emergency Department (HOSPITAL_BASED_OUTPATIENT_CLINIC_OR_DEPARTMENT_OTHER)
Admission: EM | Admit: 2019-07-07 | Discharge: 2019-07-07 | Disposition: A | Discharge status: Home / Self Care | Attending: Emergency Medicine | Admitting: Emergency Medicine

## 2019-07-07 ENCOUNTER — Other Ambulatory Visit: Payer: Self-pay

## 2019-07-07 ENCOUNTER — Other Ambulatory Visit: Payer: Self-pay | Admitting: Medical

## 2019-07-07 VITALS — BP 140/90 | HR 110 | Temp 97.5°F | Resp 18 | Ht 74.0 in | Wt 226.6 lb

## 2019-07-07 DIAGNOSIS — R0609 Other forms of dyspnea: Secondary | ICD-10-CM | POA: Insufficient documentation

## 2019-07-07 DIAGNOSIS — G4700 Insomnia, unspecified: Secondary | ICD-10-CM

## 2019-07-07 DIAGNOSIS — E785 Hyperlipidemia, unspecified: Secondary | ICD-10-CM | POA: Insufficient documentation

## 2019-07-07 DIAGNOSIS — R0789 Other chest pain: Secondary | ICD-10-CM | POA: Insufficient documentation

## 2019-07-07 DIAGNOSIS — F17210 Nicotine dependence, cigarettes, uncomplicated: Secondary | ICD-10-CM | POA: Insufficient documentation

## 2019-07-07 DIAGNOSIS — M62838 Other muscle spasm: Secondary | ICD-10-CM

## 2019-07-07 DIAGNOSIS — R42 Dizziness and giddiness: Secondary | ICD-10-CM | POA: Insufficient documentation

## 2019-07-07 DIAGNOSIS — R079 Chest pain, unspecified: Secondary | ICD-10-CM

## 2019-07-07 DIAGNOSIS — I10 Essential (primary) hypertension: Secondary | ICD-10-CM | POA: Insufficient documentation

## 2019-07-07 HISTORY — DX: Other muscle spasm: M62.838

## 2019-07-07 LAB — BASIC METABOLIC PANEL
Anion gap: 10 (ref 5–15)
BUN: 24 mg/dL — ABNORMAL HIGH (ref 6–20)
CO2: 26 mmol/L (ref 22–32)
Calcium: 9.9 mg/dL (ref 8.9–10.3)
Chloride: 98 mmol/L (ref 98–111)
Creatinine, Ser: 1.14 mg/dL (ref 0.61–1.24)
GFR calc Af Amer: >60 mL/min (ref >60)
GFR calc non Af Amer: >60 mL/min (ref >60)
Glucose, Bld: 95 mg/dL (ref 70–99)
Potassium: 4.3 mmol/L (ref 3.5–5.1)
Sodium: 134 mmol/L — ABNORMAL LOW (ref 135–145)

## 2019-07-07 LAB — CBC
HCT: 47.2 % (ref 39.0–52.0)
Hemoglobin: 15.8 g/dL (ref 13.0–17.0)
MCH: 31.3 pg (ref 26.0–34.0)
MCHC: 33.5 g/dL (ref 30.0–36.0)
MCV: 93.7 fL (ref 80.0–100.0)
Platelets: 356 10*3/uL (ref 150–400)
RBC: 5.04 MIL/uL (ref 4.22–5.81)
RDW: 13.2 % (ref 11.5–15.5)
WBC: 15.2 10*3/uL — ABNORMAL HIGH (ref 4.0–10.5)
nRBC: 0 % (ref 0.0–0.2)

## 2019-07-07 LAB — TROPONIN I (HIGH SENSITIVITY)
Troponin I (High Sensitivity): 8 ng/L (ref <18)
Troponin I (High Sensitivity): 8 ng/L (ref <18)

## 2019-07-07 MED ORDER — LOSARTAN POTASSIUM 50 MG PO TABS: 50.0000 mg | ORAL_TABLET | Freq: Every day | ORAL | 3 refills | Status: DC

## 2019-07-07 MED ORDER — SODIUM CHLORIDE 0.9% FLUSH
3.0000 mL | Freq: Once | INTRAVENOUS | Status: DC
  Filled 2019-07-07: qty 3

## 2019-07-07 NOTE — ED Triage Notes (Addendum)
Pt c/o CP day 2-denies fever/flu sx-sent from PCP in the building-pt NAD-steady gait

## 2019-07-07 NOTE — ED Notes (Signed)
Pt changed into gown. Call bell at bedside

## 2019-07-07 NOTE — Discharge Instructions (Addendum)
You were evaluated in the Emergency Department and after careful evaluation, we did not find any emergent condition requiring admission or further testing in the hospital.  Your exam/testing today was overall reassuring.  Your blood testing did not show any heart damage today.  Please return to the Emergency Department if you experience any worsening of your condition.  We encourage you to follow up with a primary care provider.  Thank you for allowing [name] to be a part of your care.

## 2019-07-07 NOTE — Telephone Encounter (Signed)
Opened to review 

## 2019-07-07 NOTE — Patient Instructions (Addendum)
You do have recent chest pressure and shoulder pressure.  With your cardiovascular risk factors, I do think is best for you to be evaluated in the emergency department.  You would benefit from repeat EKG and likely set of cardiac enzymes.  Your EKG today in our office showed sinus tachycardia but no obvious ischemic changes.   I do want you to go ahead and call back the physical medicine office that called you.  Hopefully they can help you with the upper and lower extremity stiffness.  Your blood pressure has been high recently despite being on low-dose losartan 25 mg.  I am going to send you an prescription of losartan 50 mg to take daily.  After ED evaluation/work-up will review and decide if need to send you to cardiologist for further assessment.  Follow-up date to be determined after ED evaluation and note reviewed.   Did call down to ED  today and discussed pt risk factor and presentation with ED MD.

## 2019-07-07 NOTE — ED Provider Notes (Signed)
[facility] Emergency Department Provider Note MRN:  [id number]  Arrival date & time: 07/07/19     Chief Complaint   Chest Pain   History of Present Illness   Bryan Lane. is a 59 y.o. year-old male with a history of hyperlipidemia, neuromuscular disorder presenting to the ED with chief complaint of chest pain.  Left-sided dull chest pain for the past 2 days.  Denies shortness of breath.  Did have some dyspnea and lightheadedness during his morning walk today.  Denies abdominal pain, no numbness or weakness to the arms or legs.  Pain seems worse with certain positions.  Describes it as a "muscular pain".  Review of Systems  A complete 10 system review of systems was obtained and all systems are negative except as noted in the HPI and PMH.   Patient's Health History    Past Medical History:  Diagnosis Date  . Bowel incontinence    due to cervical disc issue  . Depression    with pain  . Dyspnea    with  exertion and pain  . Hyperlipidemia   . Hypertension    2 years ago was on med for bp. hctz.  . Muscle spasticity   . Neuromuscular disorder ([place])    Spinal cord injury  . PTSD (post-traumatic stress disorder)   . Urine incontinence 01/2018   due to cervical issue    Past Surgical History:  Procedure Laterality Date  . ANTERIOR CERVICAL DECOMPRESSION/DISCECTOMY FUSION 4 LEVELS N/A 01/31/2018   Procedure: ANTERIOR CERVICAL DECOMPRESSION/DISCECTOMY FUSION, INTERBODY PROSTHESIS, PLATE/SCREWS CERVICAL THREE- CERVICAL FOUR, CERVICAL FOUR - CERVICAL FIVE, CERVICAL FIVE - CERVICAL SIX, CERVICAL SIX- CERVICAL SEVEN;  Surgeon: Newman Pies, MD;  Location: [facility];  Service: Neurosurgery;  Laterality: N/A;  ANTERIOR CERVICAL DECOMPRESSION/DISCECTOMY FUSION, INTERBODY PROSTHESIS, PLATE/SCREWS CER  . HAND SURGERY Right    "BB removal"  . SHOULDER SURGERY Left 2017   acl repair  . Wisdom teeth removal      Family History  Problem Relation Age  of Onset  . Diabetes Father   . Colon polyps Father   . Heart attack Mother   . Alcoholism Mother   . Hypertension Sister   . Colon cancer Neg Hx   . Esophageal cancer Neg Hx   . Rectal cancer Neg Hx   . Stomach cancer Neg Hx     Social History   Socioeconomic History  . Marital status: Single    Spouse name: Not on file  . Number of children: 2  . Years of education: 16  . Highest education level: Bachelor's degree (e.g., BA, AB, BS)  Occupational History  . Occupation: applying for disability  Tobacco Use  . Smoking status: Current Every Day Smoker    Packs/day: 0.50    Years: 40.00    Pack years: 20.00  . Smokeless tobacco: Never Used  Substance and Sexual Activity  . Alcohol use: Yes    Comment: Socially  . Drug use: Yes    Types: Marijuana    Comment: last 01/28/18   . Sexual activity: Not Currently  Other Topics Concern  . Not on file  Social History Narrative   Lives with dad and stepmother in a one story home.  Has 2 children.     Applying for disability.  Education: BS   Social Determinants of Health   Financial Resource Strain:   . Difficulty of Paying Living Expenses: Not on file  Food Insecurity:   .  Worried About Charity fundraiser in the Last Year: Not on file  . Ran Out of Food in the Last Year: Not on file  Transportation Needs:   . Lack of Transportation (Medical): Not on file  . Lack of Transportation (Non-Medical): Not on file  Physical Activity:   . Days of Exercise per Week: Not on file  . Minutes of Exercise per Session: Not on file  Stress:   . Feeling of Stress : Not on file  Social Connections:   . Frequency of Communication with Friends and Family: Not on file  . Frequency of Social Gatherings with Friends and Family: Not on file  . Attends Religious Services: Not on file  . Active Member of Clubs or Organizations: Not on file  . Attends Archivist Meetings: Not on file  . Marital Status: Not on file  Intimate Partner  Violence:   . Fear of Current or Ex-Partner: Not on file  . Emotionally Abused: Not on file  . Physically Abused: Not on file  . Sexually Abused: Not on file     Physical Exam  Vital Signs and Nursing Notes reviewed Vitals:   07/07/19 1820 07/07/19 1844  BP: (!) 152/88 (!) 137/91  Pulse: (!) 110 93  Resp: 18 18  Temp:    SpO2: 100% 100%    CONSTITUTIONAL: Well-appearing, NAD NEURO:  Alert and oriented x 3, no focal deficits EYES:  eyes equal and reactive ENT/NECK:  no LAD, no JVD CARDIO: Regular rate, well-perfused, normal S1 and S2 PULM:  CTAB no wheezing or rhonchi GI/GU:  normal bowel sounds, non-distended, non-tender MSK/SPINE:  No gross deformities, no edema SKIN:  no rash, atraumatic PSYCH:  Appropriate speech and behavior  Diagnostic and Interventional Summary    EKG Interpretation  Date/Time:  Monday July 07 2019 14:39:51 EST Ventricular Rate:  114 PR Interval:  142 QRS Duration: 100 QT Interval:  336 QTC Calculation: 463 R Axis:   87 Text Interpretation: Sinus tachycardia Biatrial enlargement Abnormal ECG Similar to prior. No STEMI Confirmed by Nanda Quinton [phone]) on 07/07/2019 3:27:35 PM      Labs Reviewed  BASIC METABOLIC PANEL - Abnormal; Notable for the following components:      Result Value   Sodium 134 (*)    BUN 24 (*)    All other components within normal limits  CBC - Abnormal; Notable for the following components:   WBC 15.2 (*)    All other components within normal limits  TROPONIN I (HIGH SENSITIVITY)  TROPONIN I (HIGH SENSITIVITY)    DG Chest 2 View  Final Result      Medications  sodium chloride flush (NS) 0.9 % injection 3 mL (3 mLs Intravenous Not Given 07/07/19 1841)     Procedures  /  Critical Care Procedures  ED Course and Medical Decision Making  I have reviewed the triage vital signs, the nursing notes, and pertinent available records from the EMR.  Pertinent labs & imaging results that were available during my care  of the patient were reviewed by me and considered in my medical decision making (see below for details).     Dull pain worse with palpation, worse with certain motions, favored musculoskeletal.  No evidence of DVT, no shortness of breath, no history of blood clots, doubt pulmonary embolism.  Chest x-ray is normal with normal mediastinum, nothing to suggest dissection.  ACS was considered as patient does have 1 or 2 risk factors but troponins are negative  and this would be a very atypical presentation.  Patient is appropriate for discharge with close PCP follow-up to determine need for stress testing as an outpatient.  Return precautions.    Barth Kirks. [place]onia Small, [place] [email or web address]  Final Clinical Impressions(s) / ED Diagnoses     ICD-10-CM   1. Chest pain, unspecified type  R07.9     ED Discharge Orders    None       Discharge Instructions Discussed with and Provided to Patient:     Discharge Instructions     You were evaluated in the Emergency Department and after careful evaluation, we did not find any emergent condition requiring admission or further testing in the hospital.  Your exam/testing today was overall reassuring.  Your blood testing did not show any heart damage today.  Please return to the Emergency Department if you experience any worsening of your condition.  We encourage you to follow up with a primary care provider.  Thank you for allowing [name] to be a part of your care.       Maudie Flakes, MD 07/07/19 [phone]

## 2019-07-07 NOTE — Progress Notes (Signed)
Subjective:    Patient ID: Bryan Lane., male    DOB: 12-09-1960, 59 y.o.   MRN: [id number]  HPI  Pt in for recent bp elevation despite adding  the chorthalidone. Pt notified me of elevated bp level by my chart and I had given him losartan and since his bp has been increasing. Pt has slight tight sensation in all his muscles. But he is describing new different pain toward left upper chest and left shoulder. He states pain in his chest is diffuse. Present since this morning.   Pt states this morning broke into sweat on short walk today. States not strenous walk.  Pt is smoker.  Pt has high cholesterol    The 10-year ASCVD risk score Mikey Bussing DC Jr., et al., 2013) is: 22.1%   Values used to calculate the score:     Age: 17 years     Sex: Male     Is Non-Hispanic African American: No     Diabetic: No     Tobacco smoker: Yes     Systolic Blood Pressure: 123456 mmHg     Is BP treated: Yes     HDL Cholesterol: 34.8 mg/dL     Total Cholesterol: 192 mg/dL   Review of Systems  Constitutional: Negative for chills, fatigue and fever.  Respiratory: Negative for chest tightness, shortness of breath and wheezing.   Cardiovascular: Negative for chest pain and palpitations.  Gastrointestinal: Negative for abdominal pain.  Musculoskeletal:       See hpi. Some shoulder area pain  Anterior diffuse chest pain. Tight sensation.  Skin: Negative for rash.  Neurological: Negative for dizziness, speech difficulty, weakness, numbness and headaches.  Hematological: Negative for adenopathy. Does not bruise/bleed easily.  Psychiatric/Behavioral: Negative for behavioral problems, decreased concentration and sleep disturbance. The patient is not nervous/anxious.     Past Medical History:  Diagnosis Date  . Bowel incontinence    due to cervical disc issue  . Depression    with pain  . Dyspnea    with  exertion and pain  . Hyperlipidemia   . Hypertension    2 years ago was on med for bp. hctz.   . Neuromuscular disorder ([place])    Spinal cord injury  . PTSD (post-traumatic stress disorder)   . Urine incontinence 01/2018   due to cervical issue     Social History   Socioeconomic History  . Marital status: Single    Spouse name: Not on file  . Number of children: 2  . Years of education: 16  . Highest education level: Bachelor's degree (e.g., BA, AB, BS)  Occupational History  . Occupation: applying for disability  Tobacco Use  . Smoking status: Current Every Day Smoker    Packs/day: 0.50    Years: 40.00    Pack years: 20.00  . Smokeless tobacco: Never Used  Substance and Sexual Activity  . Alcohol use: Yes    Comment: Socially  . Drug use: Yes    Types: Marijuana    Comment: last 01/28/18   . Sexual activity: Not Currently  Other Topics Concern  . Not on file  Social History Narrative   Lives with dad and stepmother in a one story home.  Has 2 children.     Applying for disability.  Education: BS   Social Determinants of Health   Financial Resource Strain:   . Difficulty of Paying Living Expenses: Not on file  Food Insecurity:   . Worried About Running  Out of Food in the Last Year: Not on file  . Ran Out of Food in the Last Year: Not on file  Transportation Needs:   . Lack of Transportation (Medical): Not on file  . Lack of Transportation (Non-Medical): Not on file  Physical Activity:   . Days of Exercise per Week: Not on file  . Minutes of Exercise per Session: Not on file  Stress:   . Feeling of Stress : Not on file  Social Connections:   . Frequency of Communication with Friends and Family: Not on file  . Frequency of Social Gatherings with Friends and Family: Not on file  . Attends Religious Services: Not on file  . Active Member of Clubs or Organizations: Not on file  . Attends Archivist Meetings: Not on file  . Marital Status: Not on file  Intimate Partner Violence:   . Fear of Current or Ex-Partner: Not on file  . Emotionally  Abused: Not on file  . Physically Abused: Not on file  . Sexually Abused: Not on file    Past Surgical History:  Procedure Laterality Date  . ANTERIOR CERVICAL DECOMPRESSION/DISCECTOMY FUSION 4 LEVELS N/A 01/31/2018   Procedure: ANTERIOR CERVICAL DECOMPRESSION/DISCECTOMY FUSION, INTERBODY PROSTHESIS, PLATE/SCREWS CERVICAL THREE- CERVICAL FOUR, CERVICAL FOUR - CERVICAL FIVE, CERVICAL FIVE - CERVICAL SIX, CERVICAL SIX- CERVICAL SEVEN;  Surgeon: Newman Pies, MD;  Location: [facility];  Service: Neurosurgery;  Laterality: N/A;  ANTERIOR CERVICAL DECOMPRESSION/DISCECTOMY FUSION, INTERBODY PROSTHESIS, PLATE/SCREWS CER  . HAND SURGERY Right    "BB removal"  . SHOULDER SURGERY Left 2017   acl repair  . Wisdom teeth removal      Family History  Problem Relation Age of Onset  . Diabetes Father   . Colon polyps Father   . Heart attack Mother   . Alcoholism Mother   . Hypertension Sister   . Colon cancer Neg Hx   . Esophageal cancer Neg Hx   . Rectal cancer Neg Hx   . Stomach cancer Neg Hx     No Known Allergies  Current Outpatient Medications on File Prior to Visit  Medication Sig Dispense Refill  . Ascorbic Acid (VITAMIN C PO) Take 1 tablet by mouth 3 (three) times a week.    Marland Kitchen atorvastatin (LIPITOR) 10 MG tablet Take 1 tablet (10 mg total) by mouth daily. 30 tablet 3  . b complex vitamins tablet Take 1 tablet by mouth 3 (three) times a week.    . baclofen (LIORESAL) 10 MG tablet TAKE 1 TABLET BY MOUTH 3 TIMES A DAY. MAY TAKE ADDITIONAL TABLET AT NIGHT IF NEEDED. 120 each 3  . bismuth subsalicylate (PEPTO BISMOL) 262 MG/15ML suspension Take 30 mLs by mouth daily as needed for indigestion or diarrhea or loose stools.    . chlorthalidone (HYGROTON) 25 MG tablet Take 1 tablet (25 mg total) by mouth daily. 30 tablet 3  . docusate sodium (COLACE) 100 MG capsule Take 1 capsule (100 mg total) by mouth 2 (two) times daily. 60 capsule 0  . losartan (COZAAR) 25 MG tablet Take 1 tablet (25 mg  total) by mouth daily. 30 tablet 3  . TURMERIC PO Take 1 tablet by mouth 3 (three) times a week.    Marland Kitchen VITAMIN D PO Take 1 tablet by mouth 3 (three) times a week.     No current facility-administered medications on file prior to visit.    BP (!) 154/108 (BP Location: Left Arm, Patient Position: Sitting, Cuff Size: Normal)  Pulse (!) 120   Temp (!) 97.5 F (36.4 C) (Temporal)   Resp 18   Ht 6\' 2"  (1.88 m)   Wt 226 lb 9.6 oz (102.8 kg)   SpO2 98%   BMI 29.09 kg/m       Objective:   Physical Exam  General Mental Status- Alert. General Appearance- Not in acute distress.  Pt sweating some during interview.  Skin General: Color- Normal Color. Moisture- Normal Moisture.  Neck Carotid Arteries- Normal color. Moisture- Normal Moisture. No carotid bruits. No JVD.  Chest and Lung Exam Auscultation: Breath Sounds:-Normal.  Cardiovascular Auscultation:Rythm- Regular. Murmurs & Other Heart Sounds:Auscultation of the heart reveals- No Murmurs.  Abdomen Inspection:-Inspeection Normal. Palpation/Percussion:Note:No mass. Palpation and Percussion of the abdomen reveal- Non Tender, Non Distended + BS, no rebound or guarding.    Neurologic Cranial Nerve exam:- CN III-XII intact(No nystagmus), symmetric smile. Strength:- 3/5 equal and symmetric strength both upper and lower extremities.     Assessment & Plan:  You do have recent chest pressure and shoulder pressure.  With your cardiovascular risk factors, I do think is best for you to be evaluated in the emergency department.  You would benefit from repeat EKG and likely set of cardiac enzymes.  Your EKG today in our office showed sinus tachycardia but no obvious ischemic changes.   I do want you to go ahead and call back the physical medicine office that called you.  Hopefully they can help you with the upper and lower extremity stiffness.  Your blood pressure has been high recently despite being on low-dose losartan 25 mg.  I  am going to send you an prescription of losartan 50 mg to take daily.  After ED evaluation/work-up will review and decide if need to send you to cardiologist for further assessment.  Follow-up date to be determined after ED evaluation and note reviewed.  30+ minutes spent with pt. 50% of time spent counseling pt on plan going forward. Did call down to ED  today and discussed pt risk factor and presentation with ED MD.  Mackie Pai, PA-C

## 2019-07-08 ENCOUNTER — Encounter: Payer: Self-pay | Admitting: Physical Medicine and Rehabilitation

## 2019-07-11 ENCOUNTER — Ambulatory Visit: Admitting: Medical

## 2019-08-04 ENCOUNTER — Encounter: Payer: Self-pay | Admitting: Physical Medicine and Rehabilitation

## 2019-08-04 ENCOUNTER — Encounter: Payer: Self-pay | Admitting: Medical

## 2019-08-04 ENCOUNTER — Other Ambulatory Visit: Payer: Self-pay

## 2019-08-04 ENCOUNTER — Encounter: Attending: Physical Medicine and Rehabilitation | Admitting: Physical Medicine and Rehabilitation

## 2019-08-04 VITALS — BP 116/80 | HR 88 | Temp 97.9°F | Ht 74.0 in | Wt 231.0 lb

## 2019-08-04 DIAGNOSIS — G992 Myelopathy in diseases classified elsewhere: Secondary | ICD-10-CM | POA: Insufficient documentation

## 2019-08-04 DIAGNOSIS — R252 Cramp and spasm: Secondary | ICD-10-CM | POA: Insufficient documentation

## 2019-08-04 DIAGNOSIS — M4802 Spinal stenosis, cervical region: Secondary | ICD-10-CM | POA: Insufficient documentation

## 2019-08-04 DIAGNOSIS — R261 Paralytic gait: Secondary | ICD-10-CM | POA: Diagnosis not present

## 2019-08-04 HISTORY — DX: Cramp and spasm: R25.2

## 2019-08-04 MED ORDER — BACLOFEN 10 MG PO TABS: 15.0000 mg | ORAL_TABLET | Freq: Four times a day (QID) | ORAL | 5 refills | Status: DC

## 2019-08-04 MED ORDER — TIZANIDINE HCL 4 MG PO CAPS: 4.0000 mg | ORAL_CAPSULE | Freq: Every day | ORAL | 5 refills | Status: DC

## 2019-08-04 NOTE — Progress Notes (Signed)
Subjective:    Patient ID: Bryan Lane., male    DOB: Jun 21, 1960, 59 y.o.   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  Here for spasticity evaluation   If stays still too long, gets spasms, and if still, gets twitches.   Baclofen q4-6 hours- so ~5x/day.  Has read about it.  Real sore in AM when wakes up- Struggles to get moving. Takes meds- - OK til noon. Tries to walk-  Moving through [employer] makes it a short distance.  The more he moves, and exercises, the worse it is.  Feels more like restless leg syndrome at night- cannot sit still.     Pain Inventory Average Pain 2 Pain Right Now 2 My pain is sharp, burning, stabbing, tingling and aching  In the last 24 hours, has pain interfered with the following? General activity 5 Relation with others 3 Enjoyment of life 9 What TIME of day is your pain at its worst? morning Sleep (in general) Poor  Pain is worse with: walking, bending, sitting, inactivity and standing Pain improves with: medication Relief from Meds: 5  Mobility walk without assistance how many minutes can you walk? 15-30 ability to climb steps?  yes do you drive?  yes  Function not employed: date last employed .  Neuro/Psych weakness numbness trouble walking spasms dizziness depression anxiety  Prior Studies new  Physicians involved in your care new   Family History  Problem Relation Age of Onset  . Diabetes Father   . Colon polyps Father   . Heart attack Mother   . Alcoholism Mother   . Hypertension Sister   . Colon cancer Neg Hx   . Esophageal cancer Neg Hx   . Rectal cancer Neg Hx   . Stomach cancer Neg Hx    Social History   Socioeconomic History  . Marital status: Single    Spouse name: Not on file  . Number of children: 2  . Years of education: 16  . Highest education level:  Bachelor's degree (e.g., BA, AB, BS)  Occupational History  . Occupation: applying for disability  Tobacco Use  . Smoking status: Current Every Day Smoker    Packs/day: 0.50    Years: 40.00    Pack years: 20.00  . Smokeless tobacco: Never Used  Substance and Sexual Activity  . Alcohol use: Yes    Comment: Socially  . Drug use: Yes    Types: Marijuana    Comment: last 01/28/18   . Sexual activity: Not Currently  Other Topics Concern  . Not on file  Social History Narrative   Lives with dad and stepmother in a one story home.  Has 2 children.     Applying for disability.  Education: BS   Social Determinants of Health   Financial Resource Strain:   . Difficulty of Paying Living Expenses: Not on file  Food Insecurity:   . Worried About Charity fundraiser in the Last Year: Not on file  . Ran Out of Food in the Last Year: Not on file  Transportation Needs:   . Lack of Transportation (Medical): Not on file  . Lack of Transportation (Non-Medical): Not on file  Physical Activity:   . Days of Exercise per Week: Not on file  . Minutes of Exercise per Session: Not on file  Stress:   . Feeling  of Stress : Not on file  Social Connections:   . Frequency of Communication with Friends and Family: Not on file  . Frequency of Social Gatherings with Friends and Family: Not on file  . Attends Religious Services: Not on file  . Active Member of Clubs or Organizations: Not on file  . Attends Archivist Meetings: Not on file  . Marital Status: Not on file   Past Surgical History:  Procedure Laterality Date  . ANTERIOR CERVICAL DECOMPRESSION/DISCECTOMY FUSION 4 LEVELS N/A 01/31/2018   Procedure: ANTERIOR CERVICAL DECOMPRESSION/DISCECTOMY FUSION, INTERBODY PROSTHESIS, PLATE/SCREWS CERVICAL THREE- CERVICAL FOUR, CERVICAL FOUR - CERVICAL FIVE, CERVICAL FIVE - CERVICAL SIX, CERVICAL SIX- CERVICAL SEVEN;  Surgeon: Newman Pies, MD;  Location: [place];  Service: Neurosurgery;  Laterality:  N/A;  ANTERIOR CERVICAL DECOMPRESSION/DISCECTOMY FUSION, INTERBODY PROSTHESIS, PLATE/SCREWS CER  . HAND SURGERY Right    "BB removal"  . SHOULDER SURGERY Left 2017   acl repair  . Wisdom teeth removal     Past Medical History:  Diagnosis Date  . Bowel incontinence    due to cervical disc issue  . Depression    with pain  . Dyspnea    with  exertion and pain  . Hyperlipidemia   . Hypertension    2 years ago was on med for bp. hctz.  . Muscle spasticity   . Neuromuscular disorder ([place])    Spinal cord injury  . PTSD (post-traumatic stress disorder)   . Urine incontinence 01/2018   due to cervical issue   BP 116/80   Pulse 88   Temp 97.9 F (36.6 C)   Ht 6\' 2"  (1.88 m)   Wt 231 lb (104.8 kg)   SpO2 97%   BMI 29.66 kg/m   Opioid Risk Score:   Fall Risk Score:  `1  Depression screen PHQ 2/9  Depression screen PHQ 2/9 08/04/2019  Decreased Interest 2  Down, Depressed, Hopeless 2  PHQ - 2 Score 4  Altered sleeping 3  Tired, decreased energy 3  Change in appetite 0  Feeling bad or failure about yourself  1  Trouble concentrating 0  Moving slowly or fidgety/restless 0  Suicidal thoughts 1  PHQ-9 Score 12    Review of Systems  Constitutional: Negative.   HENT: Negative.   Eyes: Negative.   Respiratory: Negative.   Cardiovascular: Negative.   Gastrointestinal: Negative.   Endocrine: Negative.   Genitourinary: Negative.   Musculoskeletal: Positive for gait problem.       Spasms   Allergic/Immunologic: Negative.   Neurological: Positive for dizziness, weakness and numbness.  Hematological: Negative.   Psychiatric/Behavioral: Positive for dysphoric mood. The patient is nervous/anxious.   All other systems reviewed and are negative.      Objective:   Physical Exam  Awake, alert, appropriate, sitting up stiffly on table, NAD MAS 1+ to 2 in RUE and LEs B/L No hoffman's on LUE; Brisk Hoffman's of RUE No clonus B/L Good ROM of joints pecs are tight as  well/spasticity 3-4+ DTRs at patella and 3+ at achilles B/L upper traps and L scalenes are also tight on exam MS:B/: deltoids 5-/5, biceps 5-/5, WE 5-/5 on R; 4+/5 on L Grip 4+/5 on R 4-/5 on L; finger abd 4+/5 on R; 4/5 on L  LEs-  HF 4+/5, KE 5-/5, KF 5/5, DF 5-/5, PF 5-/5 Incoorindation, not fluent with gait.       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  Here for spasticity evaluation as well as incoordination and parasthesia   1. Spasticity- Increase baclofen to 15 mg 4x/day  2. Add Zanaflex- 4 mg QHS for night time spasticity  3. Went over spasticity- gets worse up to 2 years- then stabilizes- can treat but can't "fix".   4.  Can't treat incoordination except with PT/therapy. However it's REAL! And notable on exam   5. Parasthesias- doesn't want to treat/medicine for parasthesias.   6. F/U in 6 weeks.   I spent a total of 40 minutes on appointment; more than 20 minutes educating about spasticity

## 2019-08-04 NOTE — Patient Instructions (Signed)
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  Here for spasticity evaluation as well as incoordination and parasthesia   1. Spasticity- Increase baclofen to 15 mg 4x/day  2. Add Zanaflex- 4 mg QHS for night time spasticity  3. Went over spasticity- gets worse up to 2 years- then stabilizes- can treat but can't "fix".   4.  Can't treat incoordination except with PT/therapy. However it's REAL! And notable on exam   5. Parasthesias- doesn't want to treat/medicine for parasthesias.   6. F/U in 6 weeks.

## 2019-09-06 ENCOUNTER — Other Ambulatory Visit: Payer: Self-pay | Admitting: Medical

## 2019-09-15 ENCOUNTER — Other Ambulatory Visit: Payer: Self-pay | Admitting: Medical

## 2019-09-17 ENCOUNTER — Encounter: Payer: Self-pay | Admitting: Physical Medicine and Rehabilitation

## 2019-09-17 ENCOUNTER — Encounter: Attending: Physical Medicine and Rehabilitation | Admitting: Physical Medicine and Rehabilitation

## 2019-09-17 ENCOUNTER — Other Ambulatory Visit: Payer: Self-pay

## 2019-09-17 VITALS — BP 127/78 | HR 112 | Temp 97.5°F | Ht 74.0 in | Wt 229.0 lb

## 2019-09-17 DIAGNOSIS — R261 Paralytic gait: Secondary | ICD-10-CM | POA: Diagnosis present

## 2019-09-17 DIAGNOSIS — G992 Myelopathy in diseases classified elsewhere: Secondary | ICD-10-CM | POA: Diagnosis present

## 2019-09-17 DIAGNOSIS — R252 Cramp and spasm: Secondary | ICD-10-CM

## 2019-09-17 DIAGNOSIS — R202 Paresthesia of skin: Secondary | ICD-10-CM

## 2019-09-17 DIAGNOSIS — M4802 Spinal stenosis, cervical region: Secondary | ICD-10-CM | POA: Diagnosis not present

## 2019-09-17 MED ORDER — DANTROLENE SODIUM 50 MG PO CAPS: 50.0000 mg | ORAL_CAPSULE | Freq: Every day | ORAL | 5 refills | Status: DC

## 2019-09-17 NOTE — Progress Notes (Signed)
Subjective:    Patient ID: Bryan Burrow., male    DOB: [birth year], 59 y.o.   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  Here for spasticity evaluation as well as incoordination and parasthesia    Takes the Baclofen and Zanaflex 4mg  and slept well for 8 hrs.   Slept walked 1x- on peed on way to bathroom.   Takes baclofen 15 mg in AM Takes 15 mg in Afternoon- and then at night- so only 3x/day.   Can't  Exercise Walks every day.  Gets tired and taking shorter steps and eventually can't walk anymore.  With dogs- usually 15 minutes walk- maybe 1/2 mile.  Tired all the time Pulse is up because no cardio and Short of breath.  Has hearing April 27th for disability hearing.  Social Security.     Pain Inventory Average Pain 2 Pain Right Now 2 My pain is numbness  In the last 24 hours, has pain interfered with the following? General activity 7 Relation with others 7 Enjoyment of life 7 What TIME of day is your pain at its worst? all Sleep (in general) Poor  Pain is worse with: na Pain improves with: medication Relief from Meds: 4  Mobility how many minutes can you walk? 15 ability to climb steps?  yes do you drive?  yes  Function disabled: date disabled .  Neuro/Psych numbness spasms depression anxiety  Prior Studies Any changes since last visit?  no  Physicians involved in your care Any changes since last visit?  no   Family History  Problem Relation Age of Onset  . Diabetes Father   . Colon polyps Father   . Heart attack Mother   . Alcoholism Mother   . Hypertension Sister   . Colon cancer Neg Hx   . Esophageal cancer Neg Hx   . Rectal cancer Neg Hx   . Stomach cancer Neg Hx    Social History   Socioeconomic History  . Marital status: Single    Spouse name: Not on file  . Number of children: 2  . Years  of education: 16  . Highest education level: Bachelor's degree (e.g., BA, AB, BS)  Occupational History  . Occupation: applying for disability  Tobacco Use  . Smoking status: Current Every Day Smoker    Packs/day: 0.50    Years: 40.00    Pack years: 20.00  . Smokeless tobacco: Never Used  Substance and Sexual Activity  . Alcohol use: Yes    Comment: Socially  . Drug use: Yes    Types: Marijuana    Comment: last 01/28/18   . Sexual activity: Not Currently  Other Topics Concern  . Not on file  Social History Narrative   Lives with dad and stepmother in a one story home.  Has 2 children.     Applying for disability.  Education: BS   Social Determinants of Health   Financial Resource Strain:   . Difficulty of Paying Living Expenses:   Food Insecurity:   . Worried About Charity fundraiser in the Last Year:   . Arboriculturist in the Last Year:   Transportation Needs:   . Film/video editor (Medical):   Marland Kitchen Lack of Transportation (Non-Medical):   Physical Activity:   . Days of Exercise per Week:   . Minutes of  Exercise per Session:   Stress:   . Feeling of Stress :   Social Connections:   . Frequency of Communication with Friends and Family:   . Frequency of Social Gatherings with Friends and Family:   . Attends Religious Services:   . Active Member of Clubs or Organizations:   . Attends Archivist Meetings:   Marland Kitchen Marital Status:    Past Surgical History:  Procedure Laterality Date  . ANTERIOR CERVICAL DECOMPRESSION/DISCECTOMY FUSION 4 LEVELS N/A 01/31/2018   Procedure: ANTERIOR CERVICAL DECOMPRESSION/DISCECTOMY FUSION, INTERBODY PROSTHESIS, PLATE/SCREWS CERVICAL THREE- CERVICAL FOUR, CERVICAL FOUR - CERVICAL FIVE, CERVICAL FIVE - CERVICAL SIX, CERVICAL SIX- CERVICAL SEVEN;  Surgeon: Newman Pies, MD;  Location: [place];  Service: Neurosurgery;  Laterality: N/A;  ANTERIOR CERVICAL DECOMPRESSION/DISCECTOMY FUSION, INTERBODY PROSTHESIS, PLATE/SCREWS CER  . HAND  SURGERY Right    "BB removal"  . SHOULDER SURGERY Left 2017   acl repair  . Wisdom teeth removal     Past Medical History:  Diagnosis Date  . Bowel incontinence    due to cervical disc issue  . Depression    with pain  . Dyspnea    with  exertion and pain  . Hyperlipidemia   . Hypertension    2 years ago was on med for bp. hctz.  . Muscle spasticity   . Neuromuscular disorder ([place])    Spinal cord injury  . PTSD (post-traumatic stress disorder)   . Urine incontinence 01/2018   due to cervical issue   BP 127/78   Pulse (!) 112   Temp (!) 97.5 F (36.4 C)   Ht 6\' 2"  (1.88 m)   Wt 229 lb (103.9 kg)   SpO2 96%   BMI 29.40 kg/m   Opioid Risk Score:   Fall Risk Score:  `1  Depression screen PHQ 2/9  Depression screen PHQ 2/9 08/04/2019  Decreased Interest 2  Down, Depressed, Hopeless 2  PHQ - 2 Score 4  Altered sleeping 3  Tired, decreased energy 3  Change in appetite 0  Feeling bad or failure about yourself  1  Trouble concentrating 0  Moving slowly or fidgety/restless 0  Suicidal thoughts 1  PHQ-9 Score 12    Review of Systems  Gastrointestinal: Positive for constipation.  Neurological: Positive for numbness.       Spasms  Psychiatric/Behavioral: Positive for dysphoric mood. The patient is nervous/anxious.   All other systems reviewed and are negative.      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  Here for spasticity f/u  as well as incoordination and parasthesia   1. Dantrolene-  Side effects- loose stools, sedation, and possible mild weakness  Start 50 mg nightly x 1 week Then 50 mg 2x/day x 1 week Then 50 mg 3x/day x 1 week Then 100 mg 2x/day -  until dose changed  Needs CMP/LFTs/labs checked in 1 month, 3 months and every 6 months while on medication     2. So, 2nd week , wean baclofen 10 mg 3x/day  3rd week - go to 5 mg 3x/day  4th stop Baclofen.  3. CALL ME if any complications, problems- can send my  chart message as well.   4. Can split Tizanidine and do 2 mg nightly if need be.   5. Try pill organizer!  6. F/u - get PCP to do CMP at 1 month- 6 week for [name]     I spent a total of 25 minutes on visit- more than 15 minutes going over dantrolene.

## 2019-09-17 NOTE — Patient Instructions (Signed)
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  Here for spasticity f/u  as well as incoordination and parasthesia   1. Dantrolene-  Side effects- loose stools, sedation, and possible mild weakness  Start 50 mg nightly x 1 week Then 50 mg 2x/day x 1 week Then 50 mg 3x/day x 1 week Then 100 mg 2x/day - until dose changed  Needs CMP/LFTs/labs checked in 1 month, 3 months and every 6 months while on medication     2. So, 2nd week , wean baclofen 10 mg 3x/day  3rd week - go to 5 mg 3x/day  4th stop Baclofen.  3. CALL ME if any complications, problems- can send my chart message as well.   4. Can split Tizanidine and do 2 mg nightly if need be.   5. Try pill organizer!  6. F/u - get PCP to do CMP at 1 month- 6 week for [name]

## 2019-10-29 ENCOUNTER — Encounter: Payer: Self-pay | Admitting: Physical Medicine and Rehabilitation

## 2019-10-29 ENCOUNTER — Other Ambulatory Visit: Payer: Self-pay

## 2019-10-29 ENCOUNTER — Encounter: Attending: Physical Medicine and Rehabilitation | Admitting: Physical Medicine and Rehabilitation

## 2019-10-29 VITALS — BP 123/80 | HR 84 | Temp 97.2°F | Ht 74.0 in | Wt 230.0 lb

## 2019-10-29 DIAGNOSIS — R252 Cramp and spasm: Secondary | ICD-10-CM | POA: Diagnosis present

## 2019-10-29 DIAGNOSIS — G992 Myelopathy in diseases classified elsewhere: Secondary | ICD-10-CM | POA: Insufficient documentation

## 2019-10-29 DIAGNOSIS — R261 Paralytic gait: Secondary | ICD-10-CM | POA: Diagnosis not present

## 2019-10-29 DIAGNOSIS — R202 Paresthesia of skin: Secondary | ICD-10-CM | POA: Diagnosis not present

## 2019-10-29 DIAGNOSIS — M4802 Spinal stenosis, cervical region: Secondary | ICD-10-CM | POA: Insufficient documentation

## 2019-10-29 NOTE — Progress Notes (Signed)
Subjective:    Patient ID: [name]., male    DOB: [birth year], 59 y.o.   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  Here for spasticity f/u as well as incoordination and parasthesia   On the Dantrolene In A lot more pain- as far as tension goes- back is killing him. Soreness and aching.  Is a lot more tired.  Doesn't want to move around- requires effort.   Spasticity is better except at night- when in 1 place for a long time.  Now gliding along- less control.  Now leaning on a lot of furniture/walls, etc.    Sleeping better.    Hasn't been taking Zanaflex- unless really has to.  Is off Baclofen- has a lot left.   Stomach upset- bloated feeling- like not digesting right- "too much ice cream?   Pain Inventory Average Pain 3 Pain Right Now 0 My pain is intermittent and aching  In the last 24 hours, has pain interfered with the following? General activity 6 Relation with others 0 Enjoyment of life 3 What TIME of day is your pain at its worst? evening and night Sleep (in general) Fair  Pain is worse with: walking, bending, sitting, standing and some activites Pain improves with: rest and medication Relief from Meds: 2  Mobility walk without assistance ability to climb steps?  yes do you drive?  yes  Function not employed: date last employed .  Neuro/Psych numbness trouble walking spasms dizziness depression  Prior Studies Any changes since last visit?  no  Physicians involved in your care Any changes since last visit?  no   Family History  Problem Relation Age of Onset  . Diabetes Father   . Colon polyps Father   . Heart attack Mother   . Alcoholism Mother   . Hypertension Sister   . Colon cancer Neg Hx   . Esophageal cancer Neg Hx   . Rectal cancer Neg Hx   . Stomach cancer Neg Hx    Social History    Socioeconomic History  . Marital status: Single    Spouse name: Not on file  . Number of children: 2  . Years of education: 16  . Highest education level: Bachelor's degree (e.g., BA, AB, BS)  Occupational History  . Occupation: applying for disability  Tobacco Use  . Smoking status: Current Every Day Smoker    Packs/day: 0.50    Years: 40.00    Pack years: 20.00  . Smokeless tobacco: Never Used  Substance and Sexual Activity  . Alcohol use: Yes    Comment: Socially  . Drug use: Yes    Types: Marijuana    Comment: last 01/28/18   . Sexual activity: Not Currently  Other Topics Concern  . Not on file  Social History Narrative   Lives with dad and stepmother in a one story home.  Has 2 children.     Applying for disability.  Education: BS   Social Determinants of Health   Financial Resource Strain:   . Difficulty of Paying Living Expenses:   Food Insecurity:   . Worried About Charity fundraiser in the Last Year:   . Arboriculturist in the Last Year:   Transportation Needs:   . Film/video editor (Medical):   Marland Kitchen Lack of Transportation (Non-Medical):   Physical Activity:   .  Days of Exercise per Week:   . Minutes of Exercise per Session:   Stress:   . Feeling of Stress :   Social Connections:   . Frequency of Communication with Friends and Family:   . Frequency of Social Gatherings with Friends and Family:   . Attends Religious Services:   . Active Member of Clubs or Organizations:   . Attends Archivist Meetings:   Marland Kitchen Marital Status:    Past Surgical History:  Procedure Laterality Date  . ANTERIOR CERVICAL DECOMPRESSION/DISCECTOMY FUSION 4 LEVELS N/A 01/31/2018   Procedure: ANTERIOR CERVICAL DECOMPRESSION/DISCECTOMY FUSION, INTERBODY PROSTHESIS, PLATE/SCREWS CERVICAL THREE- CERVICAL FOUR, CERVICAL FOUR - CERVICAL FIVE, CERVICAL FIVE - CERVICAL SIX, CERVICAL SIX- CERVICAL SEVEN;  Surgeon: Newman Pies, MD;  Location: [place];  Service: Neurosurgery;   Laterality: N/A;  ANTERIOR CERVICAL DECOMPRESSION/DISCECTOMY FUSION, INTERBODY PROSTHESIS, PLATE/SCREWS CER  . HAND SURGERY Right    "BB removal"  . SHOULDER SURGERY Left 2017   acl repair  . Wisdom teeth removal     Past Medical History:  Diagnosis Date  . Bowel incontinence    due to cervical disc issue  . Depression    with pain  . Dyspnea    with  exertion and pain  . Hyperlipidemia   . Hypertension    2 years ago was on med for bp. hctz.  . Muscle spasticity   . Neuromuscular disorder ([place])    Spinal cord injury  . PTSD (post-traumatic stress disorder)   . Urine incontinence 01/2018   due to cervical issue   BP 123/80   Pulse 84   Temp (!) 97.2 F (36.2 C)   Ht 6\' 2"  (1.88 m)   Wt 230 lb (104.3 kg)   SpO2 99%   BMI 29.53 kg/m   Opioid Risk Score:   Fall Risk Score:  `1  Depression screen PHQ 2/9  Depression screen [facility] 2/9 10/29/2019 08/04/2019  Decreased Interest 2 2  Down, Depressed, Hopeless 2 2  PHQ - 2 Score 4 4  Altered sleeping - 3  Tired, decreased energy - 3  Change in appetite - 0  Feeling bad or failure about yourself  - 1  Trouble concentrating - 0  Moving slowly or fidgety/restless - 0  Suicidal thoughts - 1  PHQ-9 Score - 12   Review of Systems  Constitutional: Negative.   HENT: Negative.   Eyes: Negative.   Respiratory: Negative.   Cardiovascular: Negative.   Gastrointestinal: Negative.   Endocrine: Negative.   Genitourinary: Negative.   Musculoskeletal: Positive for back pain and gait problem.       Spasms  Skin: Negative.   Allergic/Immunologic: Negative.   Neurological: Positive for numbness.       Tingling  Hematological: Negative.   Psychiatric/Behavioral: Positive for dysphoric mood.  All other systems reviewed and are negative.      Objective:   Physical Exam  Awake, alert, appropriate, NAD Is actually LOOSE- MAS of 0- too loose Balance is still off- leaning when stands for awhile Lost balance when had to change  directions walking.  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  Here for spasticity f/u as well as incoordination and parasthesia    1.  Decrease dantrolene to 1  tab in AM and 2 tabs at night (150/day) for 2 weeks  2. If that's too much, than can decrease to 50 mg 2x/day- (100 mg/day) if necessary.   3. Will check CMP today since on Dantrolene  4. Use Tizanidine at night if needed.   5. F/U in 3 months  I spent a total of 20 minutes on appointment.

## 2019-10-29 NOTE — Patient Instructions (Signed)
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  Here for spasticity f/u as well as incoordination and parasthesia    1.  Decrease dantrolene to 1 tab in AM and 2 tabs at night (150/day) for 2 weeks  2. If that's too much, than can decrease to 50 mg 2x/day- (100 mg/day) if necessary.   3. Will check CMP today since on Dantrolene  4. Use Tizanidine at night if needed.   5. F/U in 3 months

## 2019-11-05 LAB — COMPREHENSIVE METABOLIC PANEL
ALT: 41 IU/L (ref 0–44)
AST: 27 IU/L (ref 0–40)
Albumin/Globulin Ratio: 2 (ref 1.2–2.2)
Albumin: 5.1 g/dL — ABNORMAL HIGH (ref 3.8–4.9)
Alkaline Phosphatase: 92 IU/L (ref 48–121)
BUN/Creatinine Ratio: 17 (ref 9–20)
BUN: 21 mg/dL (ref 6–24)
Bilirubin Total: 0.5 mg/dL (ref 0.0–1.2)
CO2: 24 mmol/L (ref 20–29)
Calcium: 10.4 mg/dL — ABNORMAL HIGH (ref 8.7–10.2)
Chloride: 100 mmol/L (ref 96–106)
Creatinine, Ser: 1.22 mg/dL (ref 0.76–1.27)
GFR calc Af Amer: 76 mL/min/{1.73_m2} (ref >59)
GFR calc non Af Amer: 66 mL/min/{1.73_m2} (ref >59)
Globulin, Total: 2.6 g/dL (ref 1.5–4.5)
Glucose: 86 mg/dL (ref 65–99)
Potassium: 4.2 mmol/L (ref 3.5–5.2)
Sodium: 139 mmol/L (ref 134–144)
Total Protein: 7.7 g/dL (ref 6.0–8.5)

## 2019-11-17 ENCOUNTER — Encounter: Payer: Self-pay | Admitting: Medical

## 2019-11-18 MED ORDER — CHLORTHALIDONE 25 MG PO TABS: ORAL_TABLET | ORAL | 3 refills | Status: DC

## 2019-11-18 MED ORDER — ATORVASTATIN CALCIUM 10 MG PO TABS: ORAL_TABLET | ORAL | 3 refills | Status: DC

## 2019-11-18 MED ORDER — LOSARTAN POTASSIUM 50 MG PO TABS: 50.0000 mg | ORAL_TABLET | Freq: Every day | ORAL | 3 refills | Status: DC

## 2019-11-24 ENCOUNTER — Encounter: Payer: Self-pay | Admitting: Medical

## 2019-11-25 MED ORDER — LOSARTAN POTASSIUM 100 MG PO TABS: 100.0000 mg | ORAL_TABLET | Freq: Every day | ORAL | 0 refills | Status: DC

## 2019-11-25 NOTE — Telephone Encounter (Signed)
Inc losartan to 100mg  and f/u edward in 2 weeks

## 2020-01-28 ENCOUNTER — Encounter: Attending: Physical Medicine and Rehabilitation | Admitting: Physical Medicine and Rehabilitation

## 2020-01-28 ENCOUNTER — Encounter: Payer: Self-pay | Admitting: Physical Medicine and Rehabilitation

## 2020-01-28 ENCOUNTER — Other Ambulatory Visit: Payer: Self-pay

## 2020-01-28 VITALS — BP 119/79 | HR 75 | Temp 98.1°F | Ht 74.0 in | Wt 230.0 lb

## 2020-01-28 DIAGNOSIS — R252 Cramp and spasm: Secondary | ICD-10-CM | POA: Insufficient documentation

## 2020-01-28 DIAGNOSIS — G992 Myelopathy in diseases classified elsewhere: Secondary | ICD-10-CM | POA: Diagnosis present

## 2020-01-28 DIAGNOSIS — R202 Paresthesia of skin: Secondary | ICD-10-CM | POA: Diagnosis not present

## 2020-01-28 DIAGNOSIS — R261 Paralytic gait: Secondary | ICD-10-CM | POA: Insufficient documentation

## 2020-01-28 DIAGNOSIS — M4802 Spinal stenosis, cervical region: Secondary | ICD-10-CM | POA: Insufficient documentation

## 2020-01-28 MED ORDER — DANTROLENE SODIUM 25 MG PO CAPS: 25.0000 mg | ORAL_CAPSULE | Freq: Two times a day (BID) | ORAL | 5 refills | Status: DC

## 2020-01-28 MED ORDER — BACLOFEN 10 MG PO TABS: 5.0000 mg | ORAL_TABLET | Freq: Four times a day (QID) | ORAL | 5 refills | Status: DC

## 2020-01-28 NOTE — Progress Notes (Signed)
Subjective:    Patient ID: Bryan Lane., male    DOB: 13-May-1961, 59 y.o.   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  Here for spasticity f/u as well as incoordination and parasthesia   Feels like gotten worse with coordination and strength.   Always relaxed now and feels weak.  Taking Dantrolene 50 mg BID right now.   Wasn't sure if due to dantrolene or due to natural progression of his SCI.   Feels real heavy all the time and easy to get exhausted.  Gets real tired, so not walking during the day right now.   So therefore, is it the Dantrolene or the debility   Dantrolene allows him to sit for longer- still stiff when get sup or when lays down at night.  Deep muscle cramps in feet- toes curled up or down- sometimes curling "internally" that hurts awful- R foot is worse with this. Usually 1x/night.  Goes into extensor tone- and then gets cramp in R foot.     Till takes Zanaflex at night- thinking Baclofen could replace that   Hasn't heard from Disability yet/still- went to see court 4/27- can take up to 5 months per disability.   Had a "brown out" last week due to LOW BP-<90/50-  They doubled his BP meds since his BP was heading up slightly.      Pain Inventory Average Pain 4 Pain Right Now 6 My pain is stabbing and aching  In the last 24 hours, has pain interfered with the following? General activity 6 Relation with others 3 Enjoyment of life 3 What TIME of day is your pain at its worst? evening Sleep (in general) Poor  Pain is worse with: walking, bending, sitting and standing Pain improves with: medication Relief from Meds: 3  Mobility walk without assistance how many minutes can you walk? 30 ability to climb steps?  yes do you drive?  yes  Function not employed: date last employed . I need assistance with the  following:  meal prep, household duties and shopping  Neuro/Psych numbness spasms depression anxiety  Prior Studies Any changes since last visit?  no  Physicians involved in your care Any changes since last visit?  no   Family History  Problem Relation Age of Onset  . Diabetes Father   . Colon polyps Father   . Heart attack Mother   . Alcoholism Mother   . Hypertension Sister   . Colon cancer Neg Hx   . Esophageal cancer Neg Hx   . Rectal cancer Neg Hx   . Stomach cancer Neg Hx    Social History   Socioeconomic History  . Marital status: Single    Spouse name: Not on file  . Number of children: 2  . Years of education: 16  . Highest education level: Bachelor's degree (e.g., BA, AB, BS)  Occupational History  . Occupation: applying for disability  Tobacco Use  . Smoking status: Current Every Day Smoker    Packs/day: 0.50    Years: 40.00    Pack years: 20.00  . Smokeless tobacco: Never Used  Vaping Use  . Vaping Use: Every day  Substance and Sexual Activity  . Alcohol use: Yes    Comment: Socially  . Drug use: Yes    Types: Marijuana    Comment: last 01/28/18   .  Sexual activity: Not Currently  Other Topics Concern  . Not on file  Social History Narrative   Lives with dad and stepmother in a one story home.  Has 2 children.     Applying for disability.  Education: BS   Social Determinants of Health   Financial Resource Strain:   . Difficulty of Paying Living Expenses:   Food Insecurity:   . Worried About Charity fundraiser in the Last Year:   . Arboriculturist in the Last Year:   Transportation Needs:   . Film/video editor (Medical):   Marland Kitchen Lack of Transportation (Non-Medical):   Physical Activity:   . Days of Exercise per Week:   . Minutes of Exercise per Session:   Stress:   . Feeling of Stress :   Social Connections:   . Frequency of Communication with Friends and Family:   . Frequency of Social Gatherings with Friends and Family:   .  Attends Religious Services:   . Active Member of Clubs or Organizations:   . Attends Archivist Meetings:   Marland Kitchen Marital Status:    Past Surgical History:  Procedure Laterality Date  . ANTERIOR CERVICAL DECOMPRESSION/DISCECTOMY FUSION 4 LEVELS N/A 01/31/2018   Procedure: ANTERIOR CERVICAL DECOMPRESSION/DISCECTOMY FUSION, INTERBODY PROSTHESIS, PLATE/SCREWS CERVICAL THREE- CERVICAL FOUR, CERVICAL FOUR - CERVICAL FIVE, CERVICAL FIVE - CERVICAL SIX, CERVICAL SIX- CERVICAL SEVEN;  Surgeon: Newman Pies, MD;  Location: [place];  Service: Neurosurgery;  Laterality: N/A;  ANTERIOR CERVICAL DECOMPRESSION/DISCECTOMY FUSION, INTERBODY PROSTHESIS, PLATE/SCREWS CER  . HAND SURGERY Right    "BB removal"  . SHOULDER SURGERY Left 2017   acl repair  . Wisdom teeth removal     Past Medical History:  Diagnosis Date  . Bowel incontinence    due to cervical disc issue  . Depression    with pain  . Dyspnea    with  exertion and pain  . Hyperlipidemia   . Hypertension    2 years ago was on med for bp. hctz.  . Muscle spasticity   . Neuromuscular disorder ([place])    Spinal cord injury  . PTSD (post-traumatic stress disorder)   . Urine incontinence 01/2018   due to cervical issue   BP 119/79   Pulse 75   Temp 98.1 F (36.7 C)   Ht 6\' 2"  (1.88 m)   Wt 230 lb (104.3 kg)   SpO2 (!) 78%   BMI 29.53 kg/m   Opioid Risk Score:   Fall Risk Score:  `1  Depression screen PHQ 2/9  Depression screen [facility] 2/9 10/29/2019 08/04/2019  Decreased Interest 2 2  Down, Depressed, Hopeless 2 2  PHQ - 2 Score 4 4  Altered sleeping - 3  Tired, decreased energy - 3  Change in appetite - 0  Feeling bad or failure about yourself  - 1  Trouble concentrating - 0  Moving slowly or fidgety/restless - 0  Suicidal thoughts - 1  PHQ-9 Score - 12    Review of Systems  Neurological: Positive for numbness.       Spasms  Psychiatric/Behavioral: Positive for dysphoric mood. The patient is nervous/anxious.   All  other systems reviewed and are negative.      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  Here for spasticity f/u as well as incoordination and parasthesia   1. Will reduce Dantrolene  To 25 mg 2x/day- wrote new Rx and 5 RFs  2. AND add baclofen 5 mg 3-4x/day. If doesn't take Zanaflex/Tizaidine at night, can take 10 mg of Baclofen at night. So 5/5/5/10mg   3. Will think about Valium low dose nightly for muscle cramping. If needs it.   4. F/U in 3 months. No interaction with BP meds   I spent a total of 25 minutes on visit- as detailed above.

## 2020-01-28 NOTE — Patient Instructions (Signed)
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  Here for spasticity f/u as well as incoordination and parasthesia   1. Will reduce Dantrolene  To 25 mg 2x/day- wrote new Rx and 5 RFs  2. AND add baclofen 5 mg 3-4x/day. If doesn't take Zanaflex/Tizaidine at night, can take 10 mg of Baclofen at night. So 5/5/5/10mg   3. Will think about Valium low dose nightly for muscle cramping. If needs it.   4. F/U in 3 months. No interaction with BP meds

## 2020-02-10 ENCOUNTER — Other Ambulatory Visit: Payer: Self-pay

## 2020-02-10 ENCOUNTER — Ambulatory Visit (INDEPENDENT_AMBULATORY_CARE_PROVIDER_SITE_OTHER): Admitting: Medical

## 2020-02-10 VITALS — BP 126/84 | HR 84 | Resp 18 | Ht 74.0 in | Wt 220.6 lb

## 2020-02-10 DIAGNOSIS — E785 Hyperlipidemia, unspecified: Secondary | ICD-10-CM

## 2020-02-10 DIAGNOSIS — E1169 Type 2 diabetes mellitus with other specified complication: Secondary | ICD-10-CM

## 2020-02-10 DIAGNOSIS — Z87891 Personal history of nicotine dependence: Secondary | ICD-10-CM

## 2020-02-10 DIAGNOSIS — I10 Essential (primary) hypertension: Secondary | ICD-10-CM | POA: Diagnosis not present

## 2020-02-10 DIAGNOSIS — R252 Cramp and spasm: Secondary | ICD-10-CM

## 2020-02-10 DIAGNOSIS — R739 Hyperglycemia, unspecified: Secondary | ICD-10-CM | POA: Diagnosis not present

## 2020-02-10 MED ORDER — LOSARTAN POTASSIUM 25 MG PO TABS: ORAL_TABLET | ORAL | 3 refills | Status: DC

## 2020-02-10 MED ORDER — CHLORTHALIDONE 25 MG PO TABS: ORAL_TABLET | ORAL | 3 refills | Status: DC

## 2020-02-10 NOTE — Patient Instructions (Addendum)
Your bp is well controlled today.  After discussion on recent blood pressure readings will prescribe losartan 75 mg daily along with chlorthalidone 25 mg.  For history of hyperlipidemia, will get metabolic panel and lipid panel today.  Might need to adjust statin dosing.  For elevated sugar will repeat A1c.  For muscle cramping we will add magnesium to the lab work.  For for spasticity follow physical medicine med regimen.  For history of smoking placed CT screening of chest order.  Coronary artery disease seen on prior CT of the screening for lung cancer.  We discussed the signs again.  Advised patient need to control risk factors as much as possible.  Please avoid smoking.  On review no chest pain instances reported.Marland Kitchen  Referral to cardiologist declined present.  But if he has any cardiac type symptoms would recommend cardiologist referral.  Follow-up date to be determined after lab review.

## 2020-02-10 NOTE — Progress Notes (Signed)
Subjective:    Patient ID: Bryan Burrow., male    DOB: Jun 05, 1961, 59 y.o.   MRN: [id number]  HPI  Pt in for follow up.  Pt states his blood pressure has been up and down. Was irregular in [place]. He states has been up and done.  Pt was on losartan 100 mg and chorthalidone 25 mg.   In past he states bp was about 90/60. Then he cut back 50 mg losartan and bp got into 150 range.   Pt wonders if 75 mg dose would be better.   Pt is still on statin. Has high cholesterol.  Pt has gotten covid vaccine.  Pt has been seen by Dr. Dagoberto Ligas Physical medicine. Recent med adjustments.   Review of Systems  Constitutional: Negative for chills, fatigue and fever.  HENT: Negative for congestion and ear pain.   Respiratory: Negative for chest tightness, shortness of breath, wheezing and stridor.   Cardiovascular: Negative for chest pain and palpitations.  Gastrointestinal: Negative for abdominal pain.  Genitourinary: Negative for difficulty urinating and flank pain.  Musculoskeletal: Negative for back pain.  Skin: Negative for rash.  Neurological: Negative for dizziness, speech difficulty, weakness, numbness and headaches.  Hematological: Negative for adenopathy. Does not bruise/bleed easily.  Psychiatric/Behavioral: Negative for behavioral problems, decreased concentration and hallucinations. The patient is not nervous/anxious.        Pt does describe some moderate-high level. Stress.    Past Medical History:  Diagnosis Date  . Bowel incontinence    due to cervical disc issue  . Depression    with pain  . Dyspnea    with  exertion and pain  . Hyperlipidemia   . Hypertension    2 years ago was on med for bp. hctz.  . Muscle spasticity   . Neuromuscular disorder ([place])    Spinal cord injury  . PTSD (post-traumatic stress disorder)   . Urine incontinence 01/2018   due to cervical issue     Social History   Socioeconomic History  . Marital status: Single    Spouse name:  Not on file  . Number of children: 2  . Years of education: 16  . Highest education level: Bachelor's degree (e.g., BA, AB, BS)  Occupational History  . Occupation: applying for disability  Tobacco Use  . Smoking status: Current Every Day Smoker    Packs/day: 0.50    Years: 40.00    Pack years: 20.00  . Smokeless tobacco: Never Used  Vaping Use  . Vaping Use: Every day  Substance and Sexual Activity  . Alcohol use: Yes    Comment: Socially  . Drug use: Yes    Types: Marijuana    Comment: last 01/28/18   . Sexual activity: Not Currently  Other Topics Concern  . Not on file  Social History Narrative   Lives with dad and stepmother in a one story home.  Has 2 children.     Applying for disability.  Education: BS   Social Determinants of Health   Financial Resource Strain:   . Difficulty of Paying Living Expenses: Not on file  Food Insecurity:   . Worried About Charity fundraiser in the Last Year: Not on file  . Ran Out of Food in the Last Year: Not on file  Transportation Needs:   . Lack of Transportation (Medical): Not on file  . Lack of Transportation (Non-Medical): Not on file  Physical Activity:   . Days of Exercise per Week:  Not on file  . Minutes of Exercise per Session: Not on file  Stress:   . Feeling of Stress : Not on file  Social Connections:   . Frequency of Communication with Friends and Family: Not on file  . Frequency of Social Gatherings with Friends and Family: Not on file  . Attends Religious Services: Not on file  . Active Member of Clubs or Organizations: Not on file  . Attends Archivist Meetings: Not on file  . Marital Status: Not on file  Intimate Partner Violence:   . Fear of Current or Ex-Partner: Not on file  . Emotionally Abused: Not on file  . Physically Abused: Not on file  . Sexually Abused: Not on file    Past Surgical History:  Procedure Laterality Date  . ANTERIOR CERVICAL DECOMPRESSION/DISCECTOMY FUSION 4 LEVELS N/A  01/31/2018   Procedure: ANTERIOR CERVICAL DECOMPRESSION/DISCECTOMY FUSION, INTERBODY PROSTHESIS, PLATE/SCREWS CERVICAL THREE- CERVICAL FOUR, CERVICAL FOUR - CERVICAL FIVE, CERVICAL FIVE - CERVICAL SIX, CERVICAL SIX- CERVICAL SEVEN;  Surgeon: Newman Pies, MD;  Location: [facility];  Service: Neurosurgery;  Laterality: N/A;  ANTERIOR CERVICAL DECOMPRESSION/DISCECTOMY FUSION, INTERBODY PROSTHESIS, PLATE/SCREWS CER  . HAND SURGERY Right    "BB removal"  . SHOULDER SURGERY Left 2017   acl repair  . Wisdom teeth removal      Family History  Problem Relation Age of Onset  . Diabetes Father   . Colon polyps Father   . Heart attack Mother   . Alcoholism Mother   . Hypertension Sister   . Colon cancer Neg Hx   . Esophageal cancer Neg Hx   . Rectal cancer Neg Hx   . Stomach cancer Neg Hx     No Known Allergies  Current Outpatient Medications on File Prior to Visit  Medication Sig Dispense Refill  . atorvastatin (LIPITOR) 10 MG tablet TAKE ONE (1) TABLET BY MOUTH EACH DAY 30 tablet 3  . baclofen (LIORESAL) 10 MG tablet Take 0.5 tablets (5 mg total) by mouth 4 (four) times daily. Can take 4th dose as full tab- 10 mg if don't take Zanaflex 90 each 5  . bismuth subsalicylate (PEPTO BISMOL) 262 MG/15ML suspension Take 30 mLs by mouth daily as needed for indigestion or diarrhea or loose stools.    . chlorthalidone (HYGROTON) 25 MG tablet TAKE ONE (1) TABLET BY MOUTH EVERY DAY 30 tablet 3  . dantrolene (DANTRIUM) 25 MG capsule Take 1 capsule (25 mg total) by mouth 2 (two) times daily. 60 capsule 5  . docusate sodium (COLACE) 100 MG capsule Take 1 capsule (100 mg total) by mouth 2 (two) times daily. (Patient not taking: Reported on 10/29/2019) 60 capsule 0  . losartan (COZAAR) 100 MG tablet Take 1 tablet (100 mg total) by mouth daily. 30 tablet 0  . tiZANidine (ZANAFLEX) 4 MG capsule Take 1 capsule (4 mg total) by mouth at bedtime. For muscle spasticity 30 capsule 5  . VITAMIN D PO Take 1 tablet by  mouth 3 (three) times a week.     No current facility-administered medications on file prior to visit.    BP 126/84 (BP Location: Right Arm, Patient Position: Sitting, Cuff Size: Large)   Pulse 84   Resp 18   Ht 6\' 2"  (1.88 m)   Wt 220 lb 9.6 oz (100.1 kg)   SpO2 100%   BMI 28.32 kg/m       Objective:   Physical Exam  General Mental Status- Alert. General Appearance- Not in acute  distress.   Skin General: Color- Normal Color. Moisture- Normal Moisture.  .  Chest and Lung Exam Auscultation: Breath Sounds:-Normal.  Cardiovascular Auscultation:Rythm- Regular. Murmurs & Other Heart Sounds:Auscultation of the heart reveals- No Murmurs.  Abdomen Inspection:-Inspeection Normal. Palpation/Percussion:Note:No mass. Palpation and Percussion of the abdomen reveal- Non Tender, Non Distended + BS, no rebound or guarding.   Neurologic Cranial Nerve exam:- CN III-XII intact(No nystagmus), symmetric smile. Strength:- 5/5 equal and symmetric strength both upper and lower extremities.      Assessment & Plan:  Your bp is well controlled today.  After discussion on recent blood pressure readings will prescribe losartan 75 mg daily along with chlorthalidone 25 mg.  For history of hyperlipidemia, will get metabolic panel and lipid panel today.  Might need to adjust statin dosing.  For elevated sugar will repeat A1c.  For muscle cramping we will add magnesium to the lab work.  For for spasticity follow physical medicine med regimen.  For history of smoking placed CT screening of chest order.   Coronary artery disease seen on prior CT of the screening for lung cancer.  We discussed the signs again.  Advised patient need to control risk factors as much as possible.  Please avoid smoking.  On review no chest pain instances reported.Marland Kitchen  Referral to cardiologist declined present.  But if he has any cardiac type symptoms would recommend cardiologist referral.  Follow-up date to be  determined after lab review.  Time spent with patient today was 40 minutes which consisted of chart review, discussing diagnosis, work up, treatment and documentation.

## 2020-02-11 LAB — CBC WITH DIFFERENTIAL/PLATELET
Absolute Monocytes: 1148 cells/uL — ABNORMAL HIGH (ref 200–950)
Basophils Absolute: 106 cells/uL (ref 0–200)
Basophils Relative: 0.8 %
Eosinophils Absolute: 343 cells/uL (ref 15–500)
Eosinophils Relative: 2.6 %
HCT: 45.2 % (ref 38.5–50.0)
Hemoglobin: 15.3 g/dL (ref 13.2–17.1)
Lymphs Abs: 4844 cells/uL — ABNORMAL HIGH (ref 850–3900)
MCH: 32.3 pg (ref 27.0–33.0)
MCHC: 33.8 g/dL (ref 32.0–36.0)
MCV: 95.4 fL (ref 80.0–100.0)
MPV: 9.5 fL (ref 7.5–12.5)
Monocytes Relative: 8.7 %
Neutro Abs: 6758 cells/uL (ref 1500–7800)
Neutrophils Relative %: 51.2 %
Platelets: 329 10*3/uL (ref 140–400)
RBC: 4.74 10*6/uL (ref 4.20–5.80)
RDW: 12.4 % (ref 11.0–15.0)
Total Lymphocyte: 36.7 %
WBC: 13.2 10*3/uL — ABNORMAL HIGH (ref 3.8–10.8)

## 2020-02-11 LAB — COMPREHENSIVE METABOLIC PANEL
AG Ratio: 2 (calc) (ref 1.0–2.5)
ALT: 60 U/L — ABNORMAL HIGH (ref 9–46)
AST: 38 U/L — ABNORMAL HIGH (ref 10–35)
Albumin: 4.8 g/dL (ref 3.6–5.1)
Alkaline phosphatase (APISO): 64 U/L (ref 35–144)
BUN: 18 mg/dL (ref 7–25)
CO2: 23 mmol/L (ref 20–32)
Calcium: 9.7 mg/dL (ref 8.6–10.3)
Chloride: 102 mmol/L (ref 98–110)
Creat: 1.13 mg/dL (ref 0.70–1.33)
Globulin: 2.4 g/dL (calc) (ref 1.9–3.7)
Glucose, Bld: 103 mg/dL — ABNORMAL HIGH (ref 65–99)
Potassium: 4.1 mmol/L (ref 3.5–5.3)
Sodium: 136 mmol/L (ref 135–146)
Total Bilirubin: 0.6 mg/dL (ref 0.2–1.2)
Total Protein: 7.2 g/dL (ref 6.1–8.1)

## 2020-02-11 LAB — LIPID PANEL
Cholesterol: 166 mg/dL (ref <200)
HDL: 37 mg/dL — ABNORMAL LOW (ref >=40)
LDL Cholesterol (Calc): 106 mg/dL (calc) — ABNORMAL HIGH
Non-HDL Cholesterol (Calc): 129 mg/dL (calc) (ref <130)
Total CHOL/HDL Ratio: 4.5 (calc) (ref <5.0)
Triglycerides: 124 mg/dL (ref <150)

## 2020-02-11 LAB — MAGNESIUM: Magnesium: 2 mg/dL (ref 1.5–2.5)

## 2020-02-11 LAB — HEMOGLOBIN A1C
Hgb A1c MFr Bld: 6.3 % of total Hgb — ABNORMAL HIGH (ref <5.7)
Mean Plasma Glucose: 134 (calc)
eAG (mmol/L): 7.4 (calc)

## 2020-02-14 ENCOUNTER — Telehealth: Payer: Self-pay | Admitting: Medical

## 2020-02-14 DIAGNOSIS — D72829 Elevated white blood cell count, unspecified: Secondary | ICD-10-CM

## 2020-02-14 NOTE — Telephone Encounter (Signed)
Referral to hematologist place.

## 2020-02-16 ENCOUNTER — Encounter: Payer: Self-pay | Admitting: Medical

## 2020-02-18 ENCOUNTER — Other Ambulatory Visit: Payer: Self-pay

## 2020-02-18 ENCOUNTER — Ambulatory Visit (HOSPITAL_BASED_OUTPATIENT_CLINIC_OR_DEPARTMENT_OTHER)
Admission: RE | Admit: 2020-02-18 | Discharge: 2020-02-18 | Disposition: A | Discharge status: Home / Self Care | Source: Ambulatory Visit | Attending: Medical | Admitting: Medical

## 2020-02-18 DIAGNOSIS — Z87891 Personal history of nicotine dependence: Secondary | ICD-10-CM | POA: Insufficient documentation

## 2020-02-19 ENCOUNTER — Encounter: Payer: Self-pay | Admitting: Medical

## 2020-02-21 ENCOUNTER — Telehealth: Payer: Self-pay | Admitting: Medical

## 2020-02-21 DIAGNOSIS — E785 Hyperlipidemia, unspecified: Secondary | ICD-10-CM

## 2020-02-21 DIAGNOSIS — I2510 Atherosclerotic heart disease of native coronary artery without angina pectoris: Secondary | ICD-10-CM

## 2020-02-21 NOTE — Telephone Encounter (Signed)
Referral to cardiologist placed. 

## 2020-02-27 ENCOUNTER — Other Ambulatory Visit: Payer: Self-pay | Admitting: Medical

## 2020-03-18 ENCOUNTER — Other Ambulatory Visit

## 2020-03-18 ENCOUNTER — Inpatient Hospital Stay: Admitting: Family

## 2020-03-26 ENCOUNTER — Other Ambulatory Visit: Payer: Self-pay | Admitting: Family

## 2020-03-26 DIAGNOSIS — D72829 Elevated white blood cell count, unspecified: Secondary | ICD-10-CM

## 2020-03-29 ENCOUNTER — Other Ambulatory Visit: Payer: Self-pay

## 2020-03-29 ENCOUNTER — Inpatient Hospital Stay (HOSPITAL_BASED_OUTPATIENT_CLINIC_OR_DEPARTMENT_OTHER): Admitting: Family

## 2020-03-29 ENCOUNTER — Inpatient Hospital Stay: Attending: Hematology & Oncology

## 2020-03-29 VITALS — BP 133/91 | HR 84 | Temp 98.9°F | Resp 18 | Ht 74.0 in | Wt 223.0 lb

## 2020-03-29 DIAGNOSIS — D72829 Elevated white blood cell count, unspecified: Secondary | ICD-10-CM | POA: Insufficient documentation

## 2020-03-29 DIAGNOSIS — Z79899 Other long term (current) drug therapy: Secondary | ICD-10-CM | POA: Diagnosis not present

## 2020-03-29 DIAGNOSIS — F4310 Post-traumatic stress disorder, unspecified: Secondary | ICD-10-CM | POA: Insufficient documentation

## 2020-03-29 DIAGNOSIS — F32A Depression, unspecified: Secondary | ICD-10-CM | POA: Insufficient documentation

## 2020-03-29 DIAGNOSIS — R159 Full incontinence of feces: Secondary | ICD-10-CM | POA: Insufficient documentation

## 2020-03-29 DIAGNOSIS — G709 Myoneural disorder, unspecified: Secondary | ICD-10-CM | POA: Insufficient documentation

## 2020-03-29 DIAGNOSIS — R0600 Dyspnea, unspecified: Secondary | ICD-10-CM | POA: Insufficient documentation

## 2020-03-29 DIAGNOSIS — M62838 Other muscle spasm: Secondary | ICD-10-CM | POA: Insufficient documentation

## 2020-03-29 DIAGNOSIS — I10 Essential (primary) hypertension: Secondary | ICD-10-CM | POA: Insufficient documentation

## 2020-03-29 DIAGNOSIS — E785 Hyperlipidemia, unspecified: Secondary | ICD-10-CM | POA: Insufficient documentation

## 2020-03-29 LAB — CBC WITH DIFFERENTIAL (CANCER CENTER ONLY)
Abs Immature Granulocytes: 0.04 10*3/uL (ref 0.00–0.07)
Basophils Absolute: 0.1 10*3/uL (ref 0.0–0.1)
Basophils Relative: 1 %
Eosinophils Absolute: 0.3 10*3/uL (ref 0.0–0.5)
Eosinophils Relative: 2 %
HCT: 42.2 % (ref 39.0–52.0)
Hemoglobin: 14.1 g/dL (ref 13.0–17.0)
Immature Granulocytes: 0 %
Lymphocytes Relative: 36 %
Lymphs Abs: 4.6 10*3/uL — ABNORMAL HIGH (ref 0.7–4.0)
MCH: 31.7 pg (ref 26.0–34.0)
MCHC: 33.4 g/dL (ref 30.0–36.0)
MCV: 94.8 fL (ref 80.0–100.0)
Monocytes Absolute: 1 10*3/uL (ref 0.1–1.0)
Monocytes Relative: 8 %
Neutro Abs: 6.8 10*3/uL (ref 1.7–7.7)
Neutrophils Relative %: 53 %
Platelet Count: 331 10*3/uL (ref 150–400)
RBC: 4.45 MIL/uL (ref 4.22–5.81)
RDW: 13.3 % (ref 11.5–15.5)
WBC Count: 12.9 10*3/uL — ABNORMAL HIGH (ref 4.0–10.5)
nRBC: 0 % (ref 0.0–0.2)

## 2020-03-29 LAB — CMP (CANCER CENTER ONLY)
ALT: 50 U/L — ABNORMAL HIGH (ref 0–44)
AST: 33 U/L (ref 15–41)
Albumin: 4.9 g/dL (ref 3.5–5.0)
Alkaline Phosphatase: 56 U/L (ref 38–126)
Anion gap: 7 (ref 5–15)
BUN: 16 mg/dL (ref 6–20)
CO2: 30 mmol/L (ref 22–32)
Calcium: 10.5 mg/dL — ABNORMAL HIGH (ref 8.9–10.3)
Chloride: 102 mmol/L (ref 98–111)
Creatinine: 1.27 mg/dL — ABNORMAL HIGH (ref 0.61–1.24)
GFR, Estimated: >60 mL/min (ref >60)
Glucose, Bld: 122 mg/dL — ABNORMAL HIGH (ref 70–99)
Potassium: 4 mmol/L (ref 3.5–5.1)
Sodium: 139 mmol/L (ref 135–145)
Total Bilirubin: 0.4 mg/dL (ref 0.3–1.2)
Total Protein: 7.2 g/dL (ref 6.5–8.1)

## 2020-03-29 LAB — SAVE SMEAR(SSMR), FOR PROVIDER SLIDE REVIEW

## 2020-03-29 LAB — LACTATE DEHYDROGENASE: LDH: 170 U/L (ref 98–192)

## 2020-03-29 NOTE — Progress Notes (Signed)
Hematology/Oncology Consultation   Name: Bryan Lane.      MRN: [id number]    Location: Room/bed info not found  Date: 03/29/2020 Time:1:26 PM   REFERRING PHYSICIAN: Evern Core, PA-C  REASON FOR CONSULT: Leukocytosis    DIAGNOSIS: Leukocytosis, reactive  HISTORY OF PRESENT ILLNESS: Bryan Lane is a very pleasant 59 yo caucasian gentleman with a long history of leukocytosis. WBC count today is stable to improved at 12.9.  He has had issues with chronic skin infections that wax and wane for many years.  He also quit smoking 5 months ago but is still vaping daily.  No fever, chills, n/v, cough, dizziness, SOB, chest pain, palpitations, abdominal pain or changes in bowel or bladder habits.  No episodes of bleeding. No abnormal bruising, no petechiae.  No swelling in his extremities.  He has history of spinal cord injury requiring surgery on the cervical spine in 2019 and lumbar spine in 2020. He has intermittent numbness and tingling in the toes of his right foot as well as in the left hand as a result.  He also has history of shoulder surgery without any complications.  No falls or syncopal episodes to report.  He has maintained a good appetite and is staying well hydrated. His weight is stable at 223 lbs.  No personal of familial history of cancer. No family history of blood abnormalities that he is aware of.  He previously worked as a Chief Financial Officer.  No recreational drug use. He rarely has an alcoholic beverage socially, maybe once a year.   ROS: All other 10 point review of systems is negative.   PAST MEDICAL HISTORY:   Past Medical History:  Diagnosis Date  . Bowel incontinence    due to cervical disc issue  . Depression    with pain  . Dyspnea    with  exertion and pain  . Hyperlipidemia   . Hypertension    2 years ago was on med for bp. hctz.  . Muscle cramping 01/14/2018  . Muscle spasticity   . Neuromuscular disorder ([place])    Spinal cord injury  .  Paresthesia 01/14/2018  . PTSD (post-traumatic stress disorder)   . Spastic gait 01/14/2018  . Spasticity 08/04/2019  . Spondylolisthesis of lumbar region 02/06/2019  . 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 Urine incontinence 01/2018   due to cervical issue    ALLERGIES: No Known Allergies    MEDICATIONS:  Current Outpatient Medications on File Prior to Visit  Medication Sig Dispense Refill  . atorvastatin (LIPITOR) 10 MG tablet TAKE ONE (1) TABLET BY MOUTH EACH DAY 30 tablet 3  . baclofen (LIORESAL) 10 MG tablet Take 0.5 tablets (5 mg total) by mouth 4 (four) times daily. Can take 4th dose as full tab- 10 mg if don't take Zanaflex 90 each 5  . chlorthalidone (HYGROTON) 25 MG tablet TAKE ONE (1) TABLET BY MOUTH EVERY DAY 30 tablet 3  . dantrolene (DANTRIUM) 25 MG capsule Take 1 capsule (25 mg total) by mouth 2 (two) times daily. 60 capsule 5  . losartan (COZAAR) 25 MG tablet 3 tab po q day 90 tablet 3  . tiZANidine (ZANAFLEX) 4 MG capsule Take 1 capsule (4 mg total) by mouth at bedtime. For muscle spasticity 30 capsule 5  . VITAMIN D PO Take 1 tablet by mouth 3 (three) times a week.     No current facility-administered medications on file prior to visit.  PAST SURGICAL HISTORY Past Surgical History:  Procedure Laterality Date  . ANTERIOR CERVICAL DECOMPRESSION/DISCECTOMY FUSION 4 LEVELS N/A 01/31/2018   Procedure: ANTERIOR CERVICAL DECOMPRESSION/DISCECTOMY FUSION, INTERBODY PROSTHESIS, PLATE/SCREWS CERVICAL THREE- CERVICAL FOUR, CERVICAL FOUR - CERVICAL FIVE, CERVICAL FIVE - CERVICAL SIX, CERVICAL SIX- CERVICAL SEVEN;  Surgeon: Newman Pies, MD;  Location: [facility];  Service: Neurosurgery;  Laterality: N/A;  ANTERIOR CERVICAL DECOMPRESSION/DISCECTOMY FUSION, INTERBODY PROSTHESIS, PLATE/SCREWS CER  . HAND SURGERY Right    "BB removal"  . SHOULDER SURGERY Left 2017   acl repair  . Wisdom teeth removal      FAMILY HISTORY: Family History  Problem Relation  Age of Onset  . Diabetes Father   . Colon polyps Father   . Heart attack Mother   . Alcoholism Mother   . Hypertension Sister   . Colon cancer Neg Hx   . Esophageal cancer Neg Hx   . Rectal cancer Neg Hx   . Stomach cancer Neg Hx     SOCIAL HISTORY:  reports that he has been smoking. He has a 20.00 pack-year smoking history. He has never used smokeless tobacco. He reports current alcohol use. He reports current drug use. Drug: Marijuana.  PERFORMANCE STATUS: The patient's performance status is 0 - Asymptomatic  PHYSICAL EXAM: Most Recent Vital Signs: There were no vitals taken for this visit. BP (!) 133/91 (Patient Position: Sitting)   Pulse 84   Temp 98.9 F (37.2 C) (Oral)   Resp 18   Ht 6\' 2"  (1.88 m)   Wt 223 lb (101.2 kg)   SpO2 100%   BMI 28.63 kg/m   General Appearance:    Alert, cooperative, no distress, appears stated age  Head:    Normocephalic, without obvious abnormality, atraumatic  Eyes:    PERRL, conjunctiva/corneas clear, EOM's intact, fundi    benign, both eyes             Throat:   Lips, mucosa, and tongue normal; teeth and gums normal  Neck:   Supple, symmetrical, trachea midline, no adenopathy;       thyroid:  No enlargement/tenderness/nodules; no carotid   bruit or JVD  Back:     Symmetric, no curvature, ROM normal, no CVA tenderness  Lungs:     Clear to auscultation bilaterally, respirations unlabored  Chest wall:    No tenderness or deformity  Heart:    Regular rate and rhythm, S1 and S2 normal, no murmur, rub   or gallop  Abdomen:     Soft, non-tender, bowel sounds active all four quadrants,    no masses, no organomegaly        Extremities:   Extremities normal, atraumatic, no cyanosis or edema  Pulses:   2+ and symmetric all extremities  Skin:   Skin color, texture, turgor normal, no rashes or lesions  Lymph nodes:   Cervical, supraclavicular, and axillary nodes normal  Neurologic:   CNII-XII intact. Normal strength, sensation and reflexes       throughout    LABORATORY DATA:  Results for orders placed or performed in visit on 03/29/20 (from the past 48 hour(s))  CBC with Differential ([facility] Only)     Status: Abnormal (Preliminary result)   Collection Time: 03/29/20  1:10 PM  Result Value Ref Range   WBC Count 12.9 (H) 4.0 - 10.5 K/uL   RBC 4.45 4.22 - 5.81 MIL/uL   Hemoglobin 14.1 13.0 - 17.0 g/dL   HCT 42.2 39 - 52 %   MCV 94.8  80.0 - 100.0 fL   MCH 31.7 26.0 - 34.0 pg   MCHC 33.4 30.0 - 36.0 g/dL   RDW 13.3 11.5 - 15.5 %   Platelet Count 331 150 - 400 K/uL   nRBC 0.0 0.0 - 0.2 %    Comment: Performed at [facility] at [facility], [address], [place], [place] [address]   Neutrophils Relative % PENDING %   Neutro Abs PENDING 1.7 - 7.7 K/uL   Band Neutrophils PENDING %   Lymphocytes Relative PENDING %   Lymphs Abs PENDING 0.7 - 4.0 K/uL   Monocytes Relative PENDING %   Monocytes Absolute PENDING 0.1 - 1.0 K/uL   Eosinophils Relative PENDING %   Eosinophils Absolute PENDING 0 - 0 K/uL   Basophils Relative PENDING %   Basophils Absolute PENDING 0 - 0 K/uL   WBC Morphology PENDING    RBC Morphology PENDING    Smear Review PENDING    Other PENDING %   nRBC PENDING 0 /100 WBC   Metamyelocytes Relative PENDING %   Myelocytes PENDING %   Promyelocytes Relative PENDING %   Blasts PENDING %   Immature Granulocytes PENDING %   Abs Immature Granulocytes PENDING 0.00 - 0.07 K/uL      RADIOGRAPHY: No results found.     PATHOLOGY: None  ASSESSMENT/PLAN: Mr. Scheuring is a very pleasant 59 yo caucasian gentleman with a long history of leukocytosis felt to be reactive due to chronic back issues and skin rash (also recently quit smoking and counts are better).  Dr. Marin Olp was able to review his blood smear. No abnormality of evidence of malignancy noted.  We will plan to see him again in 6 months for follow-up and if everything remains stable to improved at that time we can let  him go from our office.   All questions were answered and he is in agreement with the plan. He can contact our office with any questions or concerns. We can certainly see him sooner if needed.   The patient was discussed with Dr. Marin Olp and he is in agreement with the aforementioned.   Laverna Peace, NP

## 2020-03-30 ENCOUNTER — Telehealth: Payer: Self-pay | Admitting: Family

## 2020-03-30 ENCOUNTER — Ambulatory Visit (INDEPENDENT_AMBULATORY_CARE_PROVIDER_SITE_OTHER): Admitting: Cardiology

## 2020-03-30 ENCOUNTER — Encounter: Payer: Self-pay | Admitting: Cardiology

## 2020-03-30 VITALS — BP 126/88 | HR 106 | Ht 74.0 in | Wt 223.0 lb

## 2020-03-30 DIAGNOSIS — I10 Essential (primary) hypertension: Secondary | ICD-10-CM

## 2020-03-30 DIAGNOSIS — R011 Cardiac murmur, unspecified: Secondary | ICD-10-CM

## 2020-03-30 DIAGNOSIS — E782 Mixed hyperlipidemia: Secondary | ICD-10-CM | POA: Diagnosis not present

## 2020-03-30 DIAGNOSIS — I2510 Atherosclerotic heart disease of native coronary artery without angina pectoris: Secondary | ICD-10-CM

## 2020-03-30 NOTE — Patient Instructions (Signed)
Medication Instructions:  No medication changes. *If you need a refill on your cardiac medications before your next appointment, please call your pharmacy*   Lab Work: None ordered If you have labs (blood work) drawn today and your tests are completely normal, you will receive your results only by: Marland Kitchen MyChart Message (if you have MyChart) OR . A paper copy in the mail If you have any lab test that is abnormal or we need to change your treatment, we will call you to review the results.   Testing/Procedures: Your physician has requested that you have a lexiscan myoview. For further information please visit [email or web address]. Please follow instruction sheet, as given.  The test will take approximately 3 to 4 hours to complete; you may bring reading material.  If someone comes with you to your appointment, they will need to remain in the main lobby due to limited space in the testing area.   How to prepare for your Myocardial Perfusion Test: . Do not eat or drink 3 hours prior to your test, except you may have water. . Do not consume products containing caffeine (regular or decaffeinated) 12 hours prior to your test. (ex: coffee, chocolate, sodas, tea). . Do bring a list of your current medications with you.  If not listed below, you may take your medications as normal. . Do wear comfortable clothes (no dresses or overalls) and walking shoes, tennis shoes preferred (No heels or open toe shoes are allowed). . Do NOT wear cologne, perfume, aftershave, or lotions (deodorant is allowed). . If these instructions are not followed, your test will have to be rescheduled.  Your physician has requested that you have an echocardiogram. Echocardiography is a painless test that uses sound waves to create images of your heart. It provides your doctor with information about the size and shape of your heart and how well your heart's chambers and valves are working. This procedure takes approximately one hour.  There are no restrictions for this procedure.    Follow-Up: At [facility], you and your health needs are our priority.  As part of our continuing mission to provide you with exceptional heart care, we have created designated Provider Care Teams.  These Care Teams include your primary Cardiologist (physician) and Advanced Practice Providers (APPs -  Physician Assistants and Nurse Practitioners) who all work together to provide you with the care you need, when you need it.  We recommend signing up for the patient portal called "MyChart".  Sign up information is provided on this After Visit Summary.  MyChart is used to connect with patients for Virtual Visits (Telemedicine).  Patients are able to view lab/test results, encounter notes, upcoming appointments, etc.  Non-urgent messages can be sent to your provider as well.   To learn more about what you can do with MyChart, go to [email or web address].    Your next appointment:   6 month(s)  The format for your next appointment:   In Person  Provider:   Jyl Heinz, MD   Other Instructions  Echocardiogram An echocardiogram is a procedure that uses painless sound waves (ultrasound) to produce an image of the heart. Images from an echocardiogram can provide important information about:  Signs of coronary artery disease (CAD).  Aneurysm detection. An aneurysm is a weak or damaged part of an artery wall that bulges out from the normal force of blood pumping through the body.  Heart size and shape. Changes in the size or shape of the heart can be associated with  certain conditions, including heart failure, aneurysm, and CAD.  Heart muscle function.  Heart valve function.  Signs of a past heart attack.  Fluid buildup around the heart.  Thickening of the heart muscle.  A tumor or infectious growth around the heart valves. Tell a health care provider about:  Any allergies you have.  All medicines you are taking, including  vitamins, herbs, eye drops, creams, and over-the-counter medicines.  Any blood disorders you have.  Any surgeries you have had.  Any medical conditions you have.  Whether you are pregnant or may be pregnant. What are the risks? Generally, this is a safe procedure. However, problems may occur, including:  Allergic reaction to dye (contrast) that may be used during the procedure. What happens before the procedure? No specific preparation is needed. You may eat and drink normally. What happens during the procedure?   An IV tube may be inserted into one of your veins.  You may receive contrast through this tube. A contrast is an injection that improves the quality of the pictures from your heart.  A gel will be applied to your chest.  A wand-like tool (transducer) will be moved over your chest. The gel will help to transmit the sound waves from the transducer.  The sound waves will harmlessly bounce off of your heart to allow the heart images to be captured in real-time motion. The images will be recorded on a computer. The procedure may vary among health care providers and hospitals. What happens after the procedure?  You may return to your normal, everyday life, including diet, activities, and medicines, unless your health care provider tells you not to do that. Summary  An echocardiogram is a procedure that uses painless sound waves (ultrasound) to produce an image of the heart.  Images from an echocardiogram can provide important information about the size and shape of your heart, heart muscle function, heart valve function, and fluid buildup around your heart.  You do not need to do anything to prepare before this procedure. You may eat and drink normally.  After the echocardiogram is completed, you may return to your normal, everyday life, unless your health care provider tells you not to do that. This information is not intended to replace advice given to you by your health  care provider. Make sure you discuss any questions you have with your health care provider. Document Revised: 09/26/2018 Document Reviewed: 07/08/2016 Elsevier Patient Education  2020 [place].  Cardiac Nuclear Scan A cardiac nuclear scan is a test that is done to check the flow of blood to your heart. It is done when you are resting and when you are exercising. The test looks for problems such as:  Not enough blood reaching a portion of the heart.  The heart muscle not working as it should. You may need this test if:  You have heart disease.  You have had lab results that are not normal.  You have had heart surgery or a balloon procedure to open up blocked arteries (angioplasty).  You have chest pain.  You have shortness of breath. In this test, a special dye (tracer) is put into your bloodstream. The tracer will travel to your heart. A camera will then take pictures of your heart to see how the tracer moves through your heart. This test is usually done at a hospital and takes 2-4 hours. Tell a doctor about:  Any allergies you have.  All medicines you are taking, including vitamins, herbs, eye drops,  creams, and over-the-counter medicines.  Any problems you or family members have had with anesthetic medicines.  Any blood disorders you have.  Any surgeries you have had.  Any medical conditions you have.  Whether you are pregnant or may be pregnant. What are the risks? Generally, this is a safe test. However, problems may occur, such as:  Serious chest pain and heart attack. This is only a risk if the stress portion of the test is done.  Rapid heartbeat.  A feeling of warmth in your chest. This feeling usually does not last long.  Allergic reaction to the tracer. What happens before the test?  Ask your doctor about changing or stopping your normal medicines. This is important.  Follow instructions from your doctor about what you cannot eat or drink.  Remove  your jewelry on the day of the test. What happens during the test?  An IV tube will be inserted into one of your veins.  Your doctor will give you a small amount of tracer through the IV tube.  You will wait for 20-40 minutes while the tracer moves through your bloodstream.  Your heart will be monitored with an electrocardiogram (ECG).  You will lie down on an exam table.  Pictures of your heart will be taken for about 15-20 minutes.  You may also have a stress test. For this test, one of these things may be done: ? You will be asked to exercise on a treadmill or a stationary bike. ? You will be given medicines that will make your heart work harder. This is done if you are unable to exercise.  When blood flow to your heart has peaked, a tracer will again be given through the IV tube.  After 20-40 minutes, you will get back on the exam table. More pictures will be taken of your heart.  Depending on the tracer that is used, more pictures may need to be taken 3-4 hours later.  Your IV tube will be removed when the test is over. The test may vary among doctors and hospitals. What happens after the test?  Ask your doctor: ? Whether you can return to your normal schedule, including diet, activities, and medicines. ? Whether you should drink more fluids. This will help to remove the tracer from your body. Drink enough fluid to keep your pee (urine) pale yellow.  Ask your doctor, or the department that is doing the test: ? When will my results be ready? ? How will I get my results? Summary  A cardiac nuclear scan is a test that is done to check the flow of blood to your heart.  Tell your doctor whether you are pregnant or may be pregnant.  Before the test, ask your doctor about changing or stopping your normal medicines. This is important.  Ask your doctor whether you can return to your normal activities. You may be asked to drink more fluids. This information is not intended to  replace advice given to you by your health care provider. Make sure you discuss any questions you have with your health care provider. Document Revised: 09/25/2018 Document Reviewed: 11/19/2017 Elsevier Patient Education  [place].

## 2020-03-30 NOTE — Progress Notes (Signed)
Cardiology Office Note:    Date:  03/30/2020   ID:  Bryan Burrow., DOB 04/04/1961, MRN [id number]  PCP:  Mackie Pai, PA-C  Cardiologist:  Jenean Lindau, MD   Referring MD: Mackie Pai, PA-C    ASSESSMENT:    1. Mixed hyperlipidemia   2. Coronary artery calcification seen on CT scan   3. Essential hypertension   4. Cardiac murmur    PLAN:    In order of problems listed above:  1. Coronary artery disease: Secondary prevention stressed with the Bryan Lane.  Importance of compliance with diet medication stressed and he vocalized understanding.  In view of his sedentary lifestyle I will do a Lexiscan to assess for any obstructive element of the coronary artery disease. 2. Essential hypertension: Blood pressure stable and diet and lifestyle modification was urged and he promises to do better 3. Mixed dyslipidemia: Diet was emphasized.  Bryan Lane is on statin therapy monitored by primary care physician 4. Ex-smoker: He vapes now and I told him to quit with soon as possible because of his general condition and possible CTS effects of vaping he understands. 5. Cardiac murmur: Echocardiogram will be done to assess murmur heard on auscultation. 6. Bryan Lane will be seen in follow-up appointment in 6 months or earlier if the Bryan Lane has any concerns    Medication Adjustments/Labs and Tests Ordered: Current medicines are reviewed at length with the Bryan Lane today.  Concerns regarding medicines are outlined above.  No orders of the defined types were placed in this encounter.  No orders of the defined types were placed in this encounter.    History of Present Illness:    Bryan Odoherty. is a 59 y.o. male who is being seen today for the evaluation of coronary artery disease at the request of [place], Percell Miller, [place].  Bryan Lane is a pleasant 59 year old male.  He has past medical history of essential hypertension and dyslipidemia.  He leads a sedentary lifestyle.  He mentions to  me that he was found to have calcifications on coronary arteries and therefore sent here for evaluation.  He denies any chest pain orthopnea or PND however he does not do much.  At the time of my evaluation, the Bryan Lane is alert awake oriented and in no distress.  Past Medical History:  Diagnosis Date  . Bowel incontinence    due to cervical disc issue  . Depression    with pain  . Dyspnea    with  exertion and pain  . Hyperlipidemia   . Hypertension    2 years ago was on med for bp. hctz.  . Muscle cramping 01/14/2018  . Muscle spasticity   . Neuromuscular disorder ([place])    Spinal cord injury  . Paresthesia 01/14/2018  . PTSD (post-traumatic stress disorder)   . Spastic gait 01/14/2018  . Spasticity 08/04/2019  . Spondylolisthesis of lumbar region 02/06/2019  . 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 Urine incontinence 01/2018   due to cervical issue    Past Surgical History:  Procedure Laterality Date  . ANTERIOR CERVICAL DECOMPRESSION/DISCECTOMY FUSION 4 LEVELS N/A 01/31/2018   Procedure: ANTERIOR CERVICAL DECOMPRESSION/DISCECTOMY FUSION, INTERBODY PROSTHESIS, PLATE/SCREWS CERVICAL THREE- CERVICAL FOUR, CERVICAL FOUR - CERVICAL FIVE, CERVICAL FIVE - CERVICAL SIX, CERVICAL SIX- CERVICAL SEVEN;  Surgeon: Newman Pies, MD;  Location: [facility];  Service: Neurosurgery;  Laterality: N/A;  ANTERIOR CERVICAL DECOMPRESSION/DISCECTOMY FUSION, INTERBODY PROSTHESIS, PLATE/SCREWS CER  . HAND SURGERY Right    "BB removal"  .  SHOULDER SURGERY Left 2017   acl repair  . Wisdom teeth removal      Current Medications: Current Meds  Medication Sig  . atorvastatin (LIPITOR) 10 MG tablet TAKE ONE (1) TABLET BY MOUTH EACH DAY  . baclofen (LIORESAL) 10 MG tablet Take 0.5 tablets (5 mg total) by mouth 4 (four) times daily. Can take 4th dose as full tab- 10 mg if don't take Zanaflex  . chlorthalidone (HYGROTON) 25 MG tablet TAKE ONE (1) TABLET BY MOUTH EVERY DAY  . dantrolene  (DANTRIUM) 25 MG capsule Take 1 capsule (25 mg total) by mouth 2 (two) times daily.  Marland Kitchen losartan (COZAAR) 25 MG tablet 3 tab po q day  . tiZANidine (ZANAFLEX) 4 MG capsule Take 1 capsule (4 mg total) by mouth at bedtime. For muscle spasticity  . VITAMIN D PO Take 1 tablet by mouth 3 (three) times a week.     Allergies:   Bryan Lane has no known allergies.   Social History   Socioeconomic History  . Marital status: Single    Spouse name: Not on file  . Number of children: 2  . Years of education: 16  . Highest education level: Bachelor's degree (e.g., BA, AB, BS)  Occupational History  . Occupation: applying for disability  Tobacco Use  . Smoking status: Current Every Day Smoker    Packs/day: 0.50    Years: 40.00    Pack years: 20.00  . Smokeless tobacco: Never Used  Vaping Use  . Vaping Use: Every day  Substance and Sexual Activity  . Alcohol use: Yes    Comment: Socially  . Drug use: Yes    Types: Marijuana    Comment: last 01/28/18   . Sexual activity: Not Currently  Other Topics Concern  . Not on file  Social History Narrative   Lives with dad and stepmother in a one story home.  Has 2 children.     Applying for disability.  Education: BS   Social Determinants of Health   Financial Resource Strain:   . Difficulty of Paying Living Expenses: Not on file  Food Insecurity:   . Worried About Charity fundraiser in the Last Year: Not on file  . Ran Out of Food in the Last Year: Not on file  Transportation Needs:   . Lack of Transportation (Medical): Not on file  . Lack of Transportation (Non-Medical): Not on file  Physical Activity:   . Days of Exercise per Week: Not on file  . Minutes of Exercise per Session: Not on file  Stress:   . Feeling of Stress : Not on file  Social Connections:   . Frequency of Communication with Friends and Family: Not on file  . Frequency of Social Gatherings with Friends and Family: Not on file  . Attends Religious Services: Not on file    . Active Member of Clubs or Organizations: Not on file  . Attends Archivist Meetings: Not on file  . Marital Status: Not on file     Family History: The Bryan Lane's family history includes Alcoholism in his mother; Colon polyps in his father; Diabetes in his father; Heart attack in his mother; Hypertension in his sister. There is no history of Colon cancer, Esophageal cancer, Rectal cancer, or Stomach cancer.  ROS:   Please see the history of present illness.    All other systems reviewed and are negative.  EKGs/Labs/Other Studies Reviewed:    The following studies were reviewed today: EKG reveals  sinus tachycardia nonspecific ST-T changes   Recent Labs: 02/10/2020: Magnesium 2.0 03/29/2020: ALT 50; BUN 16; Creatinine 1.27; Hemoglobin 14.1; Platelet Count 331; Potassium 4.0; Sodium 139  Recent Lipid Panel    Component Value Date/Time   CHOL 166 02/10/2020 1113   TRIG 124 02/10/2020 1113   HDL 37 (L) 02/10/2020 1113   CHOLHDL 4.5 02/10/2020 1113   VLDL 14.4 05/12/2019 0940   LDLCALC 106 (H) 02/10/2020 1113    Physical Exam:    VS:  BP 126/88   Pulse (!) 106   Ht 6\' 2"  (1.88 m)   Wt 223 lb (101.2 kg)   SpO2 98%   BMI 28.63 kg/m     Wt Readings from Last 3 Encounters:  03/30/20 223 lb (101.2 kg)  03/29/20 223 lb (101.2 kg)  02/10/20 220 lb 9.6 oz (100.1 kg)     GEN: Bryan Lane is in no acute distress HEENT: Normal NECK: No JVD; No carotid bruits LYMPHATICS: No lymphadenopathy CARDIAC: S1 S2 regular, 2/6 systolic murmur at the apex. RESPIRATORY:  Clear to auscultation without rales, wheezing or rhonchi  ABDOMEN: Soft, non-tender, non-distended MUSCULOSKELETAL:  No edema; No deformity  SKIN: Warm and dry NEUROLOGIC:  Alert and oriented x 3 PSYCHIATRIC:  Normal affect    Signed, Jenean Lindau, MD  03/30/2020 2:01 PM    [place]

## 2020-03-30 NOTE — Telephone Encounter (Signed)
No los 10/11 

## 2020-04-14 ENCOUNTER — Other Ambulatory Visit: Payer: Self-pay

## 2020-04-14 ENCOUNTER — Ambulatory Visit (HOSPITAL_BASED_OUTPATIENT_CLINIC_OR_DEPARTMENT_OTHER)
Admission: RE | Admit: 2020-04-14 | Discharge: 2020-04-14 | Disposition: A | Discharge status: Home / Self Care | Source: Ambulatory Visit | Attending: Cardiology | Admitting: Cardiology

## 2020-04-14 DIAGNOSIS — R011 Cardiac murmur, unspecified: Secondary | ICD-10-CM | POA: Diagnosis present

## 2020-04-14 LAB — ECHOCARDIOGRAM COMPLETE
Area-P 1/2: 2.62 cm2
S' Lateral: 2.48 cm

## 2020-04-15 ENCOUNTER — Telehealth (HOSPITAL_COMMUNITY): Payer: Self-pay

## 2020-04-15 NOTE — Telephone Encounter (Signed)
Spoke with the patient, detailed instructions given. He stated that he understood and would be here for his test. Asked to call back with any questions. S.Willliams EMTP

## 2020-04-20 ENCOUNTER — Ambulatory Visit (HOSPITAL_COMMUNITY): Attending: Cardiology

## 2020-04-20 ENCOUNTER — Other Ambulatory Visit: Payer: Self-pay

## 2020-04-20 DIAGNOSIS — I2510 Atherosclerotic heart disease of native coronary artery without angina pectoris: Secondary | ICD-10-CM

## 2020-04-20 LAB — MYOCARDIAL PERFUSION IMAGING
LV dias vol: 75 mL (ref 62–150)
LV sys vol: 34 mL
Peak HR: 106 {beats}/min
Rest HR: 76 {beats}/min
SDS: 3
SRS: 0
SSS: 3
TID: 1.15

## 2020-04-20 MED ORDER — REGADENOSON 0.4 MG/5ML IV SOLN
0.4000 mg | Freq: Once | INTRAVENOUS | Status: AC
  Administered 2020-04-20: 0.4 mg via INTRAVENOUS

## 2020-04-20 MED ORDER — TECHNETIUM TC 99M TETROFOSMIN IV KIT
31.5000 | PACK | Freq: Once | INTRAVENOUS | Status: AC | PRN
  Administered 2020-04-20: 31.5 via INTRAVENOUS
  Filled 2020-04-20: qty 32

## 2020-04-20 MED ORDER — TECHNETIUM TC 99M TETROFOSMIN IV KIT
10.4000 | PACK | Freq: Once | INTRAVENOUS | Status: AC | PRN
  Administered 2020-04-20: 10.4 via INTRAVENOUS
  Filled 2020-04-20: qty 11

## 2020-04-27 MED ORDER — TIZANIDINE HCL 4 MG PO CAPS
4.0000 mg | ORAL_CAPSULE | Freq: Every day | ORAL | 11 refills | Status: DC
Start: 2020-04-27 — End: 2020-04-30

## 2020-04-30 ENCOUNTER — Encounter: Attending: Physical Medicine and Rehabilitation | Admitting: Physical Medicine and Rehabilitation

## 2020-04-30 ENCOUNTER — Encounter: Payer: Self-pay | Admitting: Physical Medicine and Rehabilitation

## 2020-04-30 ENCOUNTER — Other Ambulatory Visit: Payer: Self-pay

## 2020-04-30 VITALS — BP 134/88 | HR 90 | Temp 97.9°F | Ht 74.0 in | Wt 227.2 lb

## 2020-04-30 DIAGNOSIS — Z7409 Other reduced mobility: Secondary | ICD-10-CM | POA: Insufficient documentation

## 2020-04-30 DIAGNOSIS — M4802 Spinal stenosis, cervical region: Secondary | ICD-10-CM | POA: Insufficient documentation

## 2020-04-30 DIAGNOSIS — R252 Cramp and spasm: Secondary | ICD-10-CM

## 2020-04-30 DIAGNOSIS — G992 Myelopathy in diseases classified elsewhere: Secondary | ICD-10-CM

## 2020-04-30 DIAGNOSIS — M62838 Other muscle spasm: Secondary | ICD-10-CM | POA: Diagnosis not present

## 2020-04-30 MED ORDER — DANTROLENE SODIUM 25 MG PO CAPS
25.0000 mg | ORAL_CAPSULE | Freq: Two times a day (BID) | ORAL | 11 refills | Status: DC
Start: 2020-04-30 — End: 2020-05-11

## 2020-04-30 MED ORDER — BACLOFEN 10 MG PO TABS: 5.0000 mg | ORAL_TABLET | Freq: Four times a day (QID) | ORAL | 11 refills | Status: DC

## 2020-04-30 MED ORDER — TIZANIDINE HCL 4 MG PO CAPS
4.0000 mg | ORAL_CAPSULE | Freq: Every day | ORAL | 11 refills | Status: DC
Start: 2020-04-30 — End: 2020-05-11

## 2020-04-30 NOTE — Progress Notes (Signed)
Subjective:    Patient ID: Bryan Lane., male    DOB: 1961/06/03, 59 y.o.   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  Here for spasticityf/uas well as incoordination and parasthesia   Since last seen, got disability!  Still doing dantrolene 25 mg BID Tizanidine 4 mg QHS Baclofen 5 mg QID- doesn't need 10 mg anymore since on Zanaflex at night.   Spasticity about the same.       Pain Inventory Average Pain 3 Pain Right Now 1 My pain is sharp and aching  In the last 24 hours, has pain interfered with the following? General activity 4 Relation with others 1 Enjoyment of life 4 What TIME of day is your pain at its worst? morning  and night Sleep (in general) Poor  Pain is worse with: bending, inactivity and standing Pain improves with: therapy/exercise Relief from Meds: 4  Family History  Problem Relation Age of Onset  . Diabetes Father   . Colon polyps Father   . Heart attack Mother   . Alcoholism Mother   . Hypertension Sister   . Colon cancer Neg Hx   . Esophageal cancer Neg Hx   . Rectal cancer Neg Hx   . Stomach cancer Neg Hx    Social History   Socioeconomic History  . Marital status: Single    Spouse name: Not on file  . Number of children: 2  . Years of education: 16  . Highest education level: Bachelor's degree (e.g., BA, AB, BS)  Occupational History  . Occupation: applying for disability  Tobacco Use  . Smoking status: Current Every Day Smoker    Packs/day: 0.50    Years: 40.00    Pack years: 20.00  . Smokeless tobacco: Never Used  Vaping Use  . Vaping Use: Every day  Substance and Sexual Activity  . Alcohol use: Yes    Comment: Socially  . Drug use: Yes    Types: Marijuana    Comment: last 01/28/18   . Sexual activity: Not Currently  Other Topics Concern  . Not on file  Social History Narrative    Lives with dad and stepmother in a one story home.  Has 2 children.     Applying for disability.  Education: BS   Social Determinants of Health   Financial Resource Strain:   . Difficulty of Paying Living Expenses: Not on file  Food Insecurity:   . Worried About Charity fundraiser in the Last Year: Not on file  . Ran Out of Food in the Last Year: Not on file  Transportation Needs:   . Lack of Transportation (Medical): Not on file  . Lack of Transportation (Non-Medical): Not on file  Physical Activity:   . Days of Exercise per Week: Not on file  . Minutes of Exercise per Session: Not on file  Stress:   . Feeling of Stress : Not on file  Social Connections:   . Frequency of Communication with Friends and Family: Not on file  . Frequency of Social Gatherings with Friends and Family: Not on file  . Attends Religious Services: Not on file  . Active Member of Clubs or Organizations: Not on file  . Attends Archivist Meetings: Not on file  . Marital Status: Not on file   Past Surgical History:  Procedure Laterality  Date  . ANTERIOR CERVICAL DECOMPRESSION/DISCECTOMY FUSION 4 LEVELS N/A 01/31/2018   Procedure: ANTERIOR CERVICAL DECOMPRESSION/DISCECTOMY FUSION, INTERBODY PROSTHESIS, PLATE/SCREWS CERVICAL THREE- CERVICAL FOUR, CERVICAL FOUR - CERVICAL FIVE, CERVICAL FIVE - CERVICAL SIX, CERVICAL SIX- CERVICAL SEVEN;  Surgeon: Newman Pies, MD;  Location: [facility];  Service: Neurosurgery;  Laterality: N/A;  ANTERIOR CERVICAL DECOMPRESSION/DISCECTOMY FUSION, INTERBODY PROSTHESIS, PLATE/SCREWS CER  . HAND SURGERY Right    "BB removal"  . SHOULDER SURGERY Left 2017   acl repair  . Wisdom teeth removal     Past Surgical History:  Procedure Laterality Date  . ANTERIOR CERVICAL DECOMPRESSION/DISCECTOMY FUSION 4 LEVELS N/A 01/31/2018   Procedure: ANTERIOR CERVICAL DECOMPRESSION/DISCECTOMY FUSION, INTERBODY PROSTHESIS, PLATE/SCREWS CERVICAL THREE- CERVICAL FOUR, CERVICAL FOUR -  CERVICAL FIVE, CERVICAL FIVE - CERVICAL SIX, CERVICAL SIX- CERVICAL SEVEN;  Surgeon: Newman Pies, MD;  Location: [facility];  Service: Neurosurgery;  Laterality: N/A;  ANTERIOR CERVICAL DECOMPRESSION/DISCECTOMY FUSION, INTERBODY PROSTHESIS, PLATE/SCREWS CER  . HAND SURGERY Right    "BB removal"  . SHOULDER SURGERY Left 2017   acl repair  . Wisdom teeth removal     Past Medical History:  Diagnosis Date  . Bowel incontinence    due to cervical disc issue  . Depression    with pain  . Dyspnea    with  exertion and pain  . Hyperlipidemia   . Hypertension    2 years ago was on med for bp. hctz.  . Muscle cramping 01/14/2018  . Muscle spasticity   . Neuromuscular disorder ([place])    Spinal cord injury  . Paresthesia 01/14/2018  . PTSD (post-traumatic stress disorder)   . Spastic gait 01/14/2018  . Spasticity 08/04/2019  . Spondylolisthesis of lumbar region 02/06/2019  . Stenosis of cervical spine with myelopathy ([place]) 01/31/2018  . Urine incontinence 01/2018   due to cervical issue   BP 134/88   Pulse 90   Temp 97.9 F (36.6 C)   Ht 6\' 2"  (1.88 m)   Wt 227 lb 3.2 oz (103.1 kg)   SpO2 97%   BMI 29.17 kg/m   Opioid Risk Score:   Fall Risk Score:  `1  Depression screen PHQ 2/9  Depression screen [facility] 2/9 04/30/2020 01/28/2020 10/29/2019 08/04/2019  Decreased Interest 3 1 2 2   Down, Depressed, Hopeless 3 1 2 2   PHQ - 2 Score 6 2 4 4   Altered sleeping - - - 3  Tired, decreased energy - - - 3  Change in appetite - - - 0  Feeling bad or failure about yourself  - - - 1  Trouble concentrating - - - 0  Moving slowly or fidgety/restless - - - 0  Suicidal thoughts - - - 1  PHQ-9 Score - - - 12    Review of Systems  Constitutional: Negative.   HENT: Negative.   Eyes: Negative.   Respiratory: Negative.   Cardiovascular: Negative.        Had a echocardiogram and stress test since last seen  Gastrointestinal: Negative.   Endocrine: Negative.   Musculoskeletal: Negative.   Skin:  Negative.   Allergic/Immunologic: Negative.   Neurological: Negative.   Hematological: Negative.   Psychiatric/Behavioral: Positive for dysphoric mood.       Reports he does a lot of research and thinks he has borderline personality disorder, and CP PTSD. Cannot afford treatment or transportations  All other systems reviewed and are negative.      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  Here for spasticityf/uas well as incoordination and parasthesia   1. Has PTSD due to living with parents- moving OUT! Makes sense   2. Con't meds- worte for year's supply of meds  3. Do need to CMP- also testing liver function - every 6 months- SOMEWHERE!   4. F/U in 1 year

## 2020-04-30 NOTE — Patient Instructions (Signed)
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  Here for spasticityf/uas well as incoordination and parasthesia   1. Has PTSD due to living with parents- moving OUT! Makes sense   2. Con't meds- worte for year's supply of meds  3. Do need to CMP- also testing liver function - every 6 months- SOMEWHERE!   4. F/U in 1 year

## 2020-05-11 ENCOUNTER — Other Ambulatory Visit: Payer: Self-pay | Admitting: *Deleted

## 2020-05-11 MED ORDER — DANTROLENE SODIUM 25 MG PO CAPS
25.0000 mg | ORAL_CAPSULE | Freq: Two times a day (BID) | ORAL | 3 refills | Status: DC
Start: 2020-05-11 — End: 2020-05-17

## 2020-05-11 MED ORDER — BACLOFEN 10 MG PO TABS
5.0000 mg | ORAL_TABLET | Freq: Four times a day (QID) | ORAL | 3 refills | Status: DC
Start: 2020-05-11 — End: 2020-09-08

## 2020-05-11 MED ORDER — TIZANIDINE HCL 4 MG PO CAPS: 4.0000 mg | ORAL_CAPSULE | Freq: Every day | ORAL | 3 refills | Status: DC

## 2020-05-11 NOTE — Telephone Encounter (Signed)
Baclofen dantroline tizanidine 90 day supply requested from [facility].

## 2020-05-17 ENCOUNTER — Other Ambulatory Visit: Payer: Self-pay | Admitting: *Deleted

## 2020-05-17 MED ORDER — TIZANIDINE HCL 4 MG PO CAPS
4.0000 mg | ORAL_CAPSULE | Freq: Every day | ORAL | 3 refills | Status: DC
Start: 2020-05-17 — End: 2020-11-10

## 2020-05-17 MED ORDER — DANTROLENE SODIUM 25 MG PO CAPS
25.0000 mg | ORAL_CAPSULE | Freq: Two times a day (BID) | ORAL | 3 refills | Status: DC
Start: 2020-05-17 — End: 2020-09-08

## 2020-06-15 ENCOUNTER — Encounter: Payer: Self-pay | Admitting: Medical

## 2020-06-16 MED ORDER — LOSARTAN POTASSIUM 25 MG PO TABS: ORAL_TABLET | ORAL | 3 refills | Status: DC

## 2020-06-16 MED ORDER — ATORVASTATIN CALCIUM 10 MG PO TABS: 10.0000 mg | ORAL_TABLET | Freq: Every day | ORAL | 3 refills | Status: DC

## 2020-06-16 MED ORDER — CHLORTHALIDONE 25 MG PO TABS: ORAL_TABLET | ORAL | 3 refills | Status: DC

## 2020-06-25 MED ORDER — DIAZEPAM 5 MG PO TABS: 5.0000 mg | ORAL_TABLET | Freq: Three times a day (TID) | ORAL | 5 refills | Status: DC | PRN

## 2020-09-08 MED ORDER — BACLOFEN 10 MG PO TABS
5.0000 mg | ORAL_TABLET | Freq: Four times a day (QID) | ORAL | 3 refills | Status: DC
Start: 2020-09-08 — End: 2020-11-10

## 2020-09-08 MED ORDER — DANTROLENE SODIUM 25 MG PO CAPS
25.0000 mg | ORAL_CAPSULE | Freq: Two times a day (BID) | ORAL | 3 refills | Status: DC
Start: 2020-09-08 — End: 2020-11-10

## 2020-09-08 MED ORDER — DIAZEPAM 5 MG PO TABS: 5.0000 mg | ORAL_TABLET | Freq: Three times a day (TID) | ORAL | 5 refills | Status: DC | PRN

## 2020-09-09 ENCOUNTER — Encounter: Payer: Self-pay | Admitting: Medical

## 2020-09-11 ENCOUNTER — Telehealth: Payer: Self-pay | Admitting: Medical

## 2020-09-11 DIAGNOSIS — R21 Rash and other nonspecific skin eruption: Secondary | ICD-10-CM

## 2020-09-11 NOTE — Telephone Encounter (Signed)
Referral to dermatologist placed. 

## 2020-09-27 ENCOUNTER — Telehealth: Payer: Self-pay

## 2020-09-27 ENCOUNTER — Other Ambulatory Visit: Payer: Self-pay

## 2020-09-27 ENCOUNTER — Encounter: Payer: Self-pay | Admitting: Medical

## 2020-09-27 ENCOUNTER — Ambulatory Visit (INDEPENDENT_AMBULATORY_CARE_PROVIDER_SITE_OTHER): Payer: Medicare Other | Admitting: Medical

## 2020-09-27 ENCOUNTER — Inpatient Hospital Stay: Payer: Medicare Other | Attending: Hematology & Oncology | Admitting: Hematology & Oncology

## 2020-09-27 ENCOUNTER — Inpatient Hospital Stay: Payer: Medicare Other

## 2020-09-27 ENCOUNTER — Encounter: Payer: Self-pay | Admitting: Hematology & Oncology

## 2020-09-27 ENCOUNTER — Other Ambulatory Visit: Payer: Self-pay | Admitting: *Deleted

## 2020-09-27 VITALS — BP 144/78 | HR 92 | Temp 97.9°F | Resp 19 | Wt 224.0 lb

## 2020-09-27 VITALS — BP 122/85 | HR 97 | Temp 98.2°F | Resp 18 | Ht 74.0 in | Wt 225.7 lb

## 2020-09-27 DIAGNOSIS — M5441 Lumbago with sciatica, right side: Secondary | ICD-10-CM | POA: Diagnosis not present

## 2020-09-27 DIAGNOSIS — I10 Essential (primary) hypertension: Secondary | ICD-10-CM | POA: Diagnosis not present

## 2020-09-27 DIAGNOSIS — D72823 Leukemoid reaction: Secondary | ICD-10-CM

## 2020-09-27 DIAGNOSIS — Z7952 Long term (current) use of systemic steroids: Secondary | ICD-10-CM | POA: Insufficient documentation

## 2020-09-27 DIAGNOSIS — R739 Hyperglycemia, unspecified: Secondary | ICD-10-CM

## 2020-09-27 DIAGNOSIS — I2510 Atherosclerotic heart disease of native coronary artery without angina pectoris: Secondary | ICD-10-CM

## 2020-09-27 DIAGNOSIS — R21 Rash and other nonspecific skin eruption: Secondary | ICD-10-CM

## 2020-09-27 DIAGNOSIS — D72829 Elevated white blood cell count, unspecified: Secondary | ICD-10-CM | POA: Diagnosis present

## 2020-09-27 DIAGNOSIS — F32A Depression, unspecified: Secondary | ICD-10-CM

## 2020-09-27 DIAGNOSIS — E785 Hyperlipidemia, unspecified: Secondary | ICD-10-CM

## 2020-09-27 DIAGNOSIS — M5442 Lumbago with sciatica, left side: Secondary | ICD-10-CM

## 2020-09-27 LAB — CMP (CANCER CENTER ONLY)
ALT: 23 U/L (ref 0–44)
AST: 24 U/L (ref 15–41)
Albumin: 5 g/dL (ref 3.5–5.0)
Alkaline Phosphatase: 42 U/L (ref 38–126)
Anion gap: 8 (ref 5–15)
BUN: 18 mg/dL (ref 6–20)
CO2: 26 mmol/L (ref 22–32)
Calcium: 10.2 mg/dL (ref 8.9–10.3)
Chloride: 99 mmol/L (ref 98–111)
Creatinine: 1.01 mg/dL (ref 0.61–1.24)
GFR, Estimated: >60 mL/min (ref >60)
Glucose, Bld: 134 mg/dL — ABNORMAL HIGH (ref 70–99)
Potassium: 4 mmol/L (ref 3.5–5.1)
Sodium: 133 mmol/L — ABNORMAL LOW (ref 135–145)
Total Bilirubin: 0.5 mg/dL (ref 0.3–1.2)
Total Protein: 7.4 g/dL (ref 6.5–8.1)

## 2020-09-27 LAB — CBC WITH DIFFERENTIAL (CANCER CENTER ONLY)
Abs Immature Granulocytes: 0.11 10*3/uL — ABNORMAL HIGH (ref 0.00–0.07)
Basophils Absolute: 0.1 10*3/uL (ref 0.0–0.1)
Basophils Relative: 0 %
Eosinophils Absolute: 0 10*3/uL (ref 0.0–0.5)
Eosinophils Relative: 0 %
HCT: 40.4 % (ref 39.0–52.0)
Hemoglobin: 14.2 g/dL (ref 13.0–17.0)
Immature Granulocytes: 1 %
Lymphocytes Relative: 23 %
Lymphs Abs: 5.6 10*3/uL — ABNORMAL HIGH (ref 0.7–4.0)
MCH: 31.9 pg (ref 26.0–34.0)
MCHC: 35.1 g/dL (ref 30.0–36.0)
MCV: 90.8 fL (ref 80.0–100.0)
Monocytes Absolute: 1.2 10*3/uL — ABNORMAL HIGH (ref 0.1–1.0)
Monocytes Relative: 5 %
Neutro Abs: 17.1 10*3/uL — ABNORMAL HIGH (ref 1.7–7.7)
Neutrophils Relative %: 71 %
Platelet Count: 327 10*3/uL (ref 150–400)
RBC: 4.45 MIL/uL (ref 4.22–5.81)
RDW: 13.5 % (ref 11.5–15.5)
WBC Count: 24.1 10*3/uL — ABNORMAL HIGH (ref 4.0–10.5)
nRBC: 0 % (ref 0.0–0.2)

## 2020-09-27 LAB — LACTATE DEHYDROGENASE: LDH: 172 U/L (ref 98–192)

## 2020-09-27 LAB — HEMOGLOBIN A1C
Hgb A1c MFr Bld: 6.4 % — ABNORMAL HIGH (ref 4.8–5.6)
Mean Plasma Glucose: 136.98 mg/dL

## 2020-09-27 LAB — SAVE SMEAR(SSMR), FOR PROVIDER SLIDE REVIEW

## 2020-09-27 NOTE — Progress Notes (Signed)
Subjective:    Patient ID: Bryan Lane., male    DOB: [birth year], 60 y.o.   MRN: [id number]  HPI  Pt in for follow up.  Pt give me update. He had lower back surgery. He states after surgery seemed to do well. But about 1 month ago ago he picked up a cinder block and flared his back up. Pt states he Dr. Arnoldo Morale did surgery. He saw surgeon who put him on prednisone for one month ago. Pt has been on valium recently. Physiatrist put him on that recently.  "Pt update me he did see cardiologist." some calcification on one study. Pt had good echo and lexican.  1. Has PTSD due to living with parents- moving OUT! Makes sense   2. Con't meds- worte for year's supply of meds  3. Do need to CMP- also testing liver function - every 6 months- SOMEWHERE!"   Pt will follow up with cardiologist tomorrow.  Hx of skin rashes in past. Referred to dermatologist. Appointment is in June.   History of high cholesterol, mild increase lft and prediabetes.    Review of Systems  Constitutional: Negative for chills, fatigue and fever.  Respiratory: Negative for cough, chest tightness and wheezing.   Cardiovascular: Negative for chest pain and palpitations.  Gastrointestinal: Negative for abdominal pain, diarrhea, rectal pain and vomiting.  Genitourinary: Negative for dysuria.  Musculoskeletal: Negative for back pain.  Neurological: Negative for dizziness, seizures, speech difficulty, weakness and headaches.  Hematological: Negative for adenopathy. Does not bruise/bleed easily.  Psychiatric/Behavioral: Positive for dysphoric mood. Negative for behavioral problems, confusion, sleep disturbance and suicidal ideas. The patient is not nervous/anxious.        Pt thinks he is also borderline personality disorder.  Pt mentions no suicidal ideations presently. Though intermittent thoughts. He reassures me that he is not thinking of more than half days. Declined referral to psychiatrist and declines  med.    Past Medical History:  Diagnosis Date  . Bowel incontinence    due to cervical disc issue  . Depression    with pain  . Dyspnea    with  exertion and pain  . Hyperlipidemia   . Hypertension    2 years ago was on med for bp. hctz.  . Muscle cramping 01/14/2018  . Muscle spasticity   . Neuromuscular disorder ([place])    Spinal cord injury  . Paresthesia 01/14/2018  . PTSD (post-traumatic stress disorder)   . Spastic gait 01/14/2018  . Spasticity 08/04/2019  . Spondylolisthesis of lumbar region 02/06/2019  . Stenosis of cervical spine with myelopathy ([place]) 01/31/2018  . Urine incontinence 01/2018   due to cervical issue     Social History   Socioeconomic History  . Marital status: Single    Spouse name: Not on file  . Number of children: 2  . Years of education: 16  . Highest education level: Bachelor's degree (e.g., BA, AB, BS)  Occupational History  . Occupation: applying for disability  Tobacco Use  . Smoking status: Current Every Day Smoker    Packs/day: 0.50    Years: 40.00    Pack years: 20.00  . Smokeless tobacco: Never Used  Vaping Use  . Vaping Use: Every day  Substance and Sexual Activity  . Alcohol use: Yes    Comment: Socially  . Drug use: Yes    Types: Marijuana    Comment: last 01/28/18   . Sexual activity: Not Currently  Other Topics Concern  . Not  on file  Social History Narrative   Lives with dad and stepmother in a one story home.  Has 2 children.     Applying for disability.  Education: BS   Social Determinants of Health   Financial Resource Strain: Not on file  Food Insecurity: Not on file  Transportation Needs: Not on file  Physical Activity: Not on file  Stress: Not on file  Social Connections: Not on file  Intimate Partner Violence: Not on file    Past Surgical History:  Procedure Laterality Date  . ANTERIOR CERVICAL DECOMPRESSION/DISCECTOMY FUSION 4 LEVELS N/A 01/31/2018   Procedure: ANTERIOR CERVICAL DECOMPRESSION/DISCECTOMY  FUSION, INTERBODY PROSTHESIS, PLATE/SCREWS CERVICAL THREE- CERVICAL FOUR, CERVICAL FOUR - CERVICAL FIVE, CERVICAL FIVE - CERVICAL SIX, CERVICAL SIX- CERVICAL SEVEN;  Surgeon: Newman Pies, MD;  Location: [facility];  Service: Neurosurgery;  Laterality: N/A;  ANTERIOR CERVICAL DECOMPRESSION/DISCECTOMY FUSION, INTERBODY PROSTHESIS, PLATE/SCREWS CER  . HAND SURGERY Right    "BB removal"  . SHOULDER SURGERY Left 2017   acl repair  . Wisdom teeth removal      Family History  Problem Relation Age of Onset  . Diabetes Father   . Colon polyps Father   . Heart attack Mother   . Alcoholism Mother   . Hypertension Sister   . Colon cancer Neg Hx   . Esophageal cancer Neg Hx   . Rectal cancer Neg Hx   . Stomach cancer Neg Hx     No Known Allergies  Current Outpatient Medications on File Prior to Visit  Medication Sig Dispense Refill  . baclofen (LIORESAL) 10 MG tablet Take 0.5 tablets (5 mg total) by mouth 4 (four) times daily. Can take 4th dose as full tab- if needed- 270 each 3  . chlorthalidone (HYGROTON) 25 MG tablet TAKE ONE (1) TABLET BY MOUTH EVERY DAY 30 tablet 3  . dantrolene (DANTRIUM) 25 MG capsule Take 1 capsule (25 mg total) by mouth 2 (two) times daily. 180 capsule 3  . diazepam (VALIUM) 5 MG tablet Take 1 tablet (5 mg total) by mouth every 8 (eight) hours as needed for muscle spasms. 90 tablet 5  . losartan (COZAAR) 25 MG tablet 3 tab po q day 90 tablet 3  . tiZANidine (ZANAFLEX) 4 MG capsule Take 1 capsule (4 mg total) by mouth at bedtime. For muscle spasticity 90 capsule 3  . VITAMIN D PO Take 1 tablet by mouth 3 (three) times a week.    Marland Kitchen atorvastatin (LIPITOR) 10 MG tablet Take 1 tablet (10 mg total) by mouth daily. 30 tablet 3   No current facility-administered medications on file prior to visit.    BP 122/85   Pulse 97   Temp 98.2 F (36.8 C)   Resp 18   Ht 6\' 2"  (1.88 m)   Wt 225 lb 11.2 oz (102.4 kg)   SpO2 97%   BMI 28.98 kg/m       Objective:   Physical  Exam  General Mental Status- Alert. General Appearance- Not in acute distress.   Skin General: Color- Normal Color. Moisture- Normal Moisture.  Neck Carotid Arteries- Normal color. Moisture- Normal Moisture. No carotid bruits. No JVD.  Chest and Lung Exam Auscultation: Breath Sounds:-Normal.  Cardiovascular Auscultation:Rythm- Regular. Murmurs & Other Heart Sounds:Auscultation of the heart reveals- No Murmurs.  Neurologic Cranial Nerve exam:- CN III-XII intact(No nystagmus), symmetric smile.       Assessment & Plan:  History of hypertension and your blood pressure is well controlled today.  Continue  losartan.  History of elevated sugar in the prediabetic range.  Will get A1c today.  History of hyperlipidemia, coronary artery disease and mild liver enzyme elevation.  Continue atorvastatin and repeat metabolic panel today along with lipid panel.  History of chronic low back pain.  Some improved with surgery but then recent flare of pain after lifting cinderblock.  Follow-up with your neurosurgeon.  Follow their advice regarding recent flare of back pain.  History of depression and self diagnosis of borderline personality disorder.  In the past declined referral to psychiatry and medication.  You do have a high PHQ-9 score.  No active thoughts of harm to self or others.  If any of these were developed then recommend ED evaluation.  We discussed answer 9 on PHQ-9 score today.  History of chronic rash.  You have appointment canceled for June locally.  However when you go down to [place] recommended to you try to see if we can get appointment there expedited if you do not need referral.  Also discussed and ability to do virtual visit when you are in [place].  Recommend you to establish PCP down there.  I can see you every 6 months locally or more frequent when you are visiting your parents.  Follow-up in 6 months or as needed.  Mackie Pai, PA-C   Time spent with patient  today was 40  minutes which consisted of chart review, discussing diagnosis, work up treatment and documentation.

## 2020-09-27 NOTE — Telephone Encounter (Signed)
Called and left a vm with f/u appts per 09/27/20 los   [employer]

## 2020-09-27 NOTE — Addendum Note (Signed)
Addended by: Kelle Darting A on: 09/27/2020 12:01 PM   Modules accepted: Orders

## 2020-09-27 NOTE — Progress Notes (Signed)
Hematology and Oncology Follow Up Visit  Bryan Lane [id number] [birth year] 60 y.o. 09/27/2020   Principle Diagnosis:   Leukocytosis-likely reactive  Current Therapy:    Observation     Interim History:  Bryan Lane is back for second office visit.  We first saw him in October 2021.  At the time, he had a minimal leukocytosis.  He does have multiple health issues.  He has inflammatory issues.  He has had cervical spine surgery.  He has had lumbar spine surgery.  He has had shoulder surgery.  He was initially put on steroids couple days ago.  I noted that his white cell count was 24,000.  However, I believe this is all secondary to steroids.  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  He had done work down in [place].  His back has been bothering him.  He actually lives down in [place].  He has a trailer down there.  He said he was picking up a cinder block and hurt his back.  He was seen by Dr. Earle Gell of [facility] neurosurgery.  He was put on some steroids.  I think he may get a epidural injection.  He has had no problems with infections.  He has had no swollen lymph nodes.  He has had no rashes.  There is been no change in bowel or bladder habits.  Overall, his performance status is ECOG 1.  Medications:  Current Outpatient Medications:  .  COVID-19 mRNA vaccine, Pfizer, 30 MCG/0.3ML injection, , Disp: , Rfl:  .  atorvastatin (LIPITOR) 10 MG tablet, Take 1 tablet (10 mg total) by mouth daily., Disp: 30 tablet, Rfl: 3 .  baclofen (LIORESAL) 10 MG tablet, Take 0.5 tablets (5 mg total) by mouth 4 (four) times daily. Can take 4th dose as full tab- if needed-, Disp: 270 each, Rfl: 3 .  chlorthalidone (HYGROTON) 25 MG tablet, TAKE ONE (1) TABLET BY MOUTH EVERY DAY, Disp: 30 tablet, Rfl: 3 .  dantrolene (DANTRIUM) 25 MG capsule, Take 1 capsule (25 mg total) by mouth 2 (two) times daily., Disp: 180 capsule, Rfl: 3 .  diazepam  (VALIUM) 5 MG tablet, Take 1 tablet (5 mg total) by mouth every 8 (eight) hours as needed for muscle spasms., Disp: 90 tablet, Rfl: 5 .  losartan (COZAAR) 25 MG tablet, 3 tab po q day, Disp: 90 tablet, Rfl: 3 .  methylPREDNISolone (MEDROL DOSEPAK) 4 MG TBPK tablet, Take 4 mg by mouth as directed., Disp: , Rfl:  .  tiZANidine (ZANAFLEX) 4 MG capsule, Take 1 capsule (4 mg total) by mouth at bedtime. For muscle spasticity, Disp: 90 capsule, Rfl: 3 .  VITAMIN D PO, Take 1 tablet by mouth 3 (three) times a week., Disp: , Rfl:   Allergies: No Known Allergies  Past Medical History, Surgical history, Social history, and Family History were reviewed and updated.  Review of Systems: Review of Systems  Constitutional: Negative.   HENT:  Negative.   Eyes: Negative.   Respiratory: Negative.   Cardiovascular: Negative.   Gastrointestinal: Negative.   Endocrine: Negative.   Genitourinary: Negative.    Musculoskeletal: Positive for back pain and neck pain.  Neurological: Negative.   Hematological: Negative.   Psychiatric/Behavioral: Negative.     Physical Exam:  weight is 224 lb (101.6 kg). His oral temperature is 97.9 F (36.6 C). His blood pressure is 144/78 (abnormal) and his pulse is 92. His  respiration is 19 and oxygen saturation is 94%.   Wt Readings from Last 3 Encounters:  09/27/20 224 lb (101.6 kg)  09/27/20 225 lb 11.2 oz (102.4 kg)  04/30/20 227 lb 3.2 oz (103.1 kg)    Physical Exam Vitals reviewed.  HENT:     Head: Normocephalic and atraumatic.  Eyes:     Pupils: Pupils are equal, round, and reactive to light.  Cardiovascular:     Rate and Rhythm: Normal rate and regular rhythm.     Heart sounds: Normal heart sounds.  Pulmonary:     Effort: Pulmonary effort is normal.     Breath sounds: Normal breath sounds.  Abdominal:     General: Bowel sounds are normal.     Palpations: Abdomen is soft.  Musculoskeletal:        General: No tenderness or deformity. Normal range of  motion.     Cervical back: Normal range of motion.  Lymphadenopathy:     Cervical: No cervical adenopathy.  Skin:    General: Skin is warm and dry.     Findings: No erythema or rash.  Neurological:     Mental Status: He is alert and oriented to person, place, and time.  Psychiatric:        Behavior: Behavior normal.        Thought Content: Thought content normal.        Judgment: Judgment normal.    Lab Results  Component Value Date   WBC 24.1 (H) 09/27/2020   HGB 14.2 09/27/2020   HCT 40.4 09/27/2020   MCV 90.8 09/27/2020   PLT 327 09/27/2020     Chemistry      Component Value Date/Time   NA 133 (L) 09/27/2020 1141   NA 139 11/04/2019 1307   K 4.0 09/27/2020 1141   CL 99 09/27/2020 1141   CO2 26 09/27/2020 1141   BUN 18 09/27/2020 1141   BUN 21 11/04/2019 1307   CREATININE 1.01 09/27/2020 1141   CREATININE 1.13 02/10/2020 1113      Component Value Date/Time   CALCIUM 10.2 09/27/2020 1141   ALKPHOS 42 09/27/2020 1141   AST 24 09/27/2020 1141   ALT 23 09/27/2020 1141   BILITOT 0.5 09/27/2020 1141      Impression and Plan: Bryan Lane is a very nice 60 year old white male.  He has mild leukocytosis.  Again I think it is exacerbated by the prednisone that he is currently taking.  I looked at his blood smear.  The white blood cells appear reactive.  I will see any immature myeloid or lymphoid cells.  I do not see anything that looks like a myeloproliferative disorder.  I still do not think that he needs a bone marrow biopsy.  I would still follow him along.  Again I really think that everything that is going on with his white cells are reactive.  I will plan to get him back in another 6 months.  I think this would be reasonable.  Volanda Napoleon, MD 4/11/202212:59 PM

## 2020-09-27 NOTE — Patient Instructions (Signed)
History of hypertension and your blood pressure is well controlled today.  Continue losartan.  History of elevated sugar in the prediabetic range.  Will get A1c today.  History of hyperlipidemia, coronary artery disease and mild liver enzyme elevation.  Continue atorvastatin and repeat metabolic panel today along with lipid panel.  History of chronic low back pain.  Some improved with surgery but then recent flare of pain after lifting cinderblock.  Follow-up with your neurosurgeon.  Follow their advice regarding recent flare of back pain.  History of depression and self diagnosis of borderline personality disorder.  In the past declined referral to psychiatry and medication.  You do have a high PHQ-9 score.  No active thoughts of harm to self or others.  If any of these were developed then recommend ED evaluation.  We discussed answer 9 on PHQ-9 score today.  History of chronic rash.  You have appointment canceled for June locally.  However when you go down to [place] recommended to you try to see if we can get appointment there expedited if you do not need referral.  Also discussed and ability to do virtual visit when you are in [place].  Recommend you to establish PCP down there.  I can see you every 6 months locally or more frequent when you are visiting your parents.  Follow-up in 6 months or as needed.

## 2020-09-28 ENCOUNTER — Other Ambulatory Visit (HOSPITAL_BASED_OUTPATIENT_CLINIC_OR_DEPARTMENT_OTHER): Payer: Self-pay | Admitting: Student

## 2020-09-28 ENCOUNTER — Encounter: Payer: Self-pay | Admitting: Medical

## 2020-09-28 DIAGNOSIS — M5416 Radiculopathy, lumbar region: Secondary | ICD-10-CM

## 2020-10-02 ENCOUNTER — Other Ambulatory Visit: Payer: Self-pay

## 2020-10-02 ENCOUNTER — Ambulatory Visit (HOSPITAL_BASED_OUTPATIENT_CLINIC_OR_DEPARTMENT_OTHER)
Admission: RE | Admit: 2020-10-02 | Discharge: 2020-10-02 | Disposition: A | Discharge status: Home / Self Care | Payer: Medicare Other | Source: Ambulatory Visit | Attending: Student | Admitting: Student

## 2020-10-02 DIAGNOSIS — M5416 Radiculopathy, lumbar region: Secondary | ICD-10-CM | POA: Diagnosis present

## 2020-10-02 MED ORDER — GADOBUTROL 1 MMOL/ML IV SOLN
10.0000 mL | Freq: Once | INTRAVENOUS | Status: AC | PRN
  Administered 2020-10-02: 10 mL via INTRAVENOUS

## 2020-10-11 ENCOUNTER — Telehealth: Payer: Self-pay | Admitting: Physical Medicine and Rehabilitation

## 2020-10-11 NOTE — Telephone Encounter (Signed)
Patient is currently in [place] will be calling to set up apt to see Dr Dagoberto Ligas when back in town.  Wanted to let her know he is being scheduled for another surgery.

## 2020-10-13 ENCOUNTER — Telehealth: Payer: Self-pay | Admitting: *Deleted

## 2020-10-13 NOTE — Telephone Encounter (Signed)
   Name: Bryan Lane.  DOB: March 12, 1961  MRN: [id number]   Primary Cardiologist: No primary care provider on file.  Chart reviewed as part of pre-operative protocol coverage. Patient was contacted 10/13/2020 in reference to pre-operative risk assessment for pending surgery as outlined below.  Bryan Lane. was last seen on 03/30/2020 by Dr. Geraldo Pitter.  Since that day, Bryan Lane. has done well without any exertional chest discomfort or worsening dyspnea.  He has chronic fatigue due to lower back pain.  Patient had a reassuring echocardiogram and a nuclear stress test in October 2021.  Therefore, based on ACC/AHA guidelines, the patient would be at acceptable risk for the planned procedure without further cardiovascular testing.   The patient was advised that if he develops new symptoms prior to surgery to contact our office to arrange for a follow-up visit, and he verbalized understanding.  I will route this recommendation to the requesting party via Epic fax function and remove from pre-op pool. Please call with questions.  [place], [place] 10/13/2020, 7:45 PM

## 2020-10-13 NOTE — Telephone Encounter (Signed)
   [facility] Pre-operative Risk Assessment    Patient Name: Bryan Lane.  DOB: Dec 14, 1960  MRN: [id number]   HEARTCARE STAFF: - Please ensure there is not already an duplicate clearance open for this procedure. - Under Visit Info/Reason for Call, type in Other and utilize the format Clearance MM/DD/YY or Clearance TBD. Do not use dashes or single digits. - If request is for dental extraction, please clarify the # of teeth to be extracted.  Request for surgical clearance:  1. What type of surgery is being performed? Left L3-4 Microdiscectomy   2. When is this surgery scheduled? TBD   3. What type of clearance is required (medical clearance vs. Pharmacy clearance to hold med vs. Both)? Medical  4. Are there any medications that need to be held prior to surgery and how long?None listed   5. Practice name and name of physician performing surgery? [facility], Dr Newman Pies   6. What is the office phone number? [phone]   7.   What is the office fax number? [phone] Attn: Janett Billow  8.   Anesthesia type (None, local, MAC, general) ? General   Mikela Senn L 10/13/2020, 5:19 PM  _________________________________________________________________   (provider comments below)

## 2020-10-13 NOTE — Telephone Encounter (Signed)
OK- schedule him whenever there's space- unfortunately he just wanted ot be seen this week, and I don't have any room, that I know of- thanks, ML

## 2020-10-22 ENCOUNTER — Encounter: Payer: Self-pay | Admitting: Medical

## 2020-10-22 ENCOUNTER — Other Ambulatory Visit: Payer: Self-pay | Admitting: Medical

## 2020-11-10 ENCOUNTER — Encounter: Payer: Self-pay | Admitting: Physical Medicine and Rehabilitation

## 2020-11-10 ENCOUNTER — Other Ambulatory Visit: Payer: Self-pay

## 2020-11-10 ENCOUNTER — Encounter
Payer: Medicare Other | Attending: Physical Medicine and Rehabilitation | Admitting: Physical Medicine and Rehabilitation

## 2020-11-10 VITALS — BP 143/86 | HR 102 | Temp 98.1°F | Ht 74.0 in | Wt 230.2 lb

## 2020-11-10 DIAGNOSIS — M47812 Spondylosis without myelopathy or radiculopathy, cervical region: Secondary | ICD-10-CM | POA: Insufficient documentation

## 2020-11-10 DIAGNOSIS — M21372 Foot drop, left foot: Secondary | ICD-10-CM | POA: Insufficient documentation

## 2020-11-10 DIAGNOSIS — Z981 Arthrodesis status: Secondary | ICD-10-CM | POA: Diagnosis present

## 2020-11-10 DIAGNOSIS — R252 Cramp and spasm: Secondary | ICD-10-CM | POA: Insufficient documentation

## 2020-11-10 DIAGNOSIS — Z7409 Other reduced mobility: Secondary | ICD-10-CM | POA: Diagnosis present

## 2020-11-10 MED ORDER — DIAZEPAM 5 MG PO TABS: 5.0000 mg | ORAL_TABLET | Freq: Three times a day (TID) | ORAL | 5 refills | Status: DC | PRN

## 2020-11-10 MED ORDER — BACLOFEN 10 MG PO TABS: 5.0000 mg | ORAL_TABLET | Freq: Four times a day (QID) | ORAL | 3 refills | Status: DC

## 2020-11-10 MED ORDER — DANTROLENE SODIUM 25 MG PO CAPS: 25.0000 mg | ORAL_CAPSULE | Freq: Two times a day (BID) | ORAL | 3 refills | Status: DC

## 2020-11-10 NOTE — Progress Notes (Signed)
Subjective:    Patient ID: Bryan Lane., male    DOB: [birth year], 60 y.o.   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  Here for spasticityf/uas well as incoordination and parasthesia and f/u after L3/L4 fusion.   Went back in back and did L3/4 fusion- "relieved pressure on nerve".   Was pain and loss of L DF- but doing bette.r   L leg hasn't hurt since surgery 2 weeks ago.  Is sore, from surgery, but other than that, doing a lot better.  It really relieved the pain- and also has improved L Dorsiflexion.  Can't help spasticity or balance- but walking without cane or assistive device- was using cane prior, but not anymore.   Couldn't stand for very long prior- but that's only a little better.   Ran out of Zanaflex - so not taking anymore.  Dantrolene 25 mg BID Baclofen 10 mg BID and 5 mg in afternoon Valium 5 mg BID and 2.5 mg in Afternoon  Valium is a miracle for cramping and spasms.    MRI of lumbar spine 10/02/20.  IMPRESSION: 1. Interval PLIF at L4-5 without residual or recurrent stenosis. 2. Adjacent segment disease at L3-4 with new left subarticular disc extrusion with inferior migration, impinging upon the descending left L4 nerve root in the left lateral recess. Moderate canal with bilateral L3 foraminal stenosis at this level. 3. Additional mild noncompressive disc bulging at L1-2 and L2-3 without significant stenosis.   Pain Inventory Average Pain 1 Pain Right Now 0 My pain is intermittent and aching  In the last 24 hours, has pain interfered with the following? General activity 0 Relation with others 0 Enjoyment of life 0 What TIME of day is your pain at its worst? morning  and night Sleep (in general) Fair  Pain is worse with: certain motion Pain improves with: medication Relief from Meds: 7  Family History   Problem Relation Age of Onset  . Diabetes Father   . Colon polyps Father   . Heart attack Mother   . Alcoholism Mother   . Hypertension Sister   . Colon cancer Neg Hx   . Esophageal cancer Neg Hx   . Rectal cancer Neg Hx   . Stomach cancer Neg Hx    Social History   Socioeconomic History  . Marital status: Single    Spouse name: Not on file  . Number of children: 2  . Years of education: 16  . Highest education level: Bachelor's degree (e.g., BA, AB, BS)  Occupational History  . Occupation: applying for disability  Tobacco Use  . Smoking status: Current Every Day Smoker    Packs/day: 0.50    Years: 40.00    Pack years: 20.00  . Smokeless tobacco: Never Used  Vaping Use  . Vaping Use: Every day  Substance and Sexual Activity  . Alcohol use: Yes    Comment: Socially  . Drug use: Yes    Types: Marijuana    Comment: last 01/28/18   . Sexual activity: Not Currently  Other Topics Concern  . Not on file  Social History Narrative   Lives with dad and stepmother in a one story home.  Has 2 children.     Applying for disability.  Education: BS   Social Determinants of Health   Financial Resource Strain: Not on  file  Food Insecurity: Not on file  Transportation Needs: Not on file  Physical Activity: Not on file  Stress: Not on file  Social Connections: Not on file   Past Surgical History:  Procedure Laterality Date  . ANTERIOR CERVICAL DECOMPRESSION/DISCECTOMY FUSION 4 LEVELS N/A 01/31/2018   Procedure: ANTERIOR CERVICAL DECOMPRESSION/DISCECTOMY FUSION, INTERBODY PROSTHESIS, PLATE/SCREWS CERVICAL THREE- CERVICAL FOUR, CERVICAL FOUR - CERVICAL FIVE, CERVICAL FIVE - CERVICAL SIX, CERVICAL SIX- CERVICAL SEVEN;  Surgeon: Newman Pies, MD;  Location: [facility];  Service: Neurosurgery;  Laterality: N/A;  ANTERIOR CERVICAL DECOMPRESSION/DISCECTOMY FUSION, INTERBODY PROSTHESIS, PLATE/SCREWS CER  . HAND SURGERY Right    "BB removal"  . SHOULDER SURGERY Left 2017   acl repair  .  Wisdom teeth removal     Past Surgical History:  Procedure Laterality Date  . ANTERIOR CERVICAL DECOMPRESSION/DISCECTOMY FUSION 4 LEVELS N/A 01/31/2018   Procedure: ANTERIOR CERVICAL DECOMPRESSION/DISCECTOMY FUSION, INTERBODY PROSTHESIS, PLATE/SCREWS CERVICAL THREE- CERVICAL FOUR, CERVICAL FOUR - CERVICAL FIVE, CERVICAL FIVE - CERVICAL SIX, CERVICAL SIX- CERVICAL SEVEN;  Surgeon: Newman Pies, MD;  Location: [facility];  Service: Neurosurgery;  Laterality: N/A;  ANTERIOR CERVICAL DECOMPRESSION/DISCECTOMY FUSION, INTERBODY PROSTHESIS, PLATE/SCREWS CER  . HAND SURGERY Right    "BB removal"  . SHOULDER SURGERY Left 2017   acl repair  . Wisdom teeth removal     Past Medical History:  Diagnosis Date  . Bowel incontinence    due to cervical disc issue  . Depression    with pain  . Dyspnea    with  exertion and pain  . Hyperlipidemia   . Hypertension    2 years ago was on med for bp. hctz.  . Muscle cramping 01/14/2018  . Muscle spasticity   . Neuromuscular disorder ([place])    Spinal cord injury  . Paresthesia 01/14/2018  . PTSD (post-traumatic stress disorder)   . Spastic gait 01/14/2018  . Spasticity 08/04/2019  . Spondylolisthesis of lumbar region 02/06/2019  . Stenosis of cervical spine with myelopathy ([place]) 01/31/2018  . Urine incontinence 01/2018   due to cervical issue   BP (!) 143/86   Pulse (!) 102   Temp 98.1 F (36.7 C)   Ht 6\' 2"  (1.88 m)   Wt 230 lb 3.2 oz (104.4 kg)   SpO2 99%   BMI 29.56 kg/m   Opioid Risk Score:   Fall Risk Score:  `1  Depression screen PHQ 2/9  Depression screen [facility] 2/9 09/27/2020 04/30/2020 01/28/2020 10/29/2019 08/04/2019  Decreased Interest 3 3 1 2 2   Down, Depressed, Hopeless 2 3 1 2 2   PHQ - 2 Score 5 6 2 4 4   Altered sleeping 1 - - - 3  Tired, decreased energy 3 - - - 3  Change in appetite 2 - - - 0  Feeling bad or failure about yourself  3 - - - 1  Trouble concentrating 1 - - - 0  Moving slowly or fidgety/restless 1 - - - 0  Suicidal  thoughts 1 - - - 1  PHQ-9 Score 17 - - - 12  Difficult doing work/chores Somewhat difficult - - - -     Review of Systems  Musculoskeletal: Positive for back pain.  Neurological: Positive for numbness.  All other systems reviewed and are negative.      Objective:   Physical Exam  Awake, alert, appropriate, walking more easily.comfortably, NAD MS: LEs HF 5/5 B/L; KE 5/5 B/L; KF 5/5 on R and 4+/5 on L;; DF 5/5 on R; L 4+/5,  PF 5/5 B/L When stands on toes, no issues; when stands on heels, has difficulty on L side- so couldn't maintain it.   Neuro: Tone is actually better- MAS of 1 in UEs and LEs' few beats clonus B/L and Hoffmans B/L A few spasms seen but "not bad per pt".       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  Here for spasticityf/uas well as incoordination and parasthesia and f/u after L3/L4 fusion.   1. Will con't Baclofen, Valium and Dantrolene and write for 6 months on Valium and 1 year for Dantrolene and Baclofen.   2. PT eval/treat- for Home exercise plan- will order PT eval for Home exercise program.   3. F/U with Surgeon as scheduled.   4. Stand on heels- B/L- multiple times per day- to work on Psychologist, educational.   5. Showed /demonstrated how to do hamstring stretches- and also can use tennis balls- to hold pressure on hamstrings/back of thighs- ~ 10 minutes- can also sit on tennis ball.   6. F/U- in 6 months.   I spent a total of 25 minutes on appointment- specifically going over exercises to help hamstring tightness as well as DF strengthening, and verifying medicine regimen.

## 2020-11-10 NOTE — Patient Instructions (Signed)
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  Here for spasticityf/uas well as incoordination and parasthesia and f/u after L3/L4 fusion.   1. Will con't Baclofen, Valium and Dantrolene and write for 6 months on Valium and 1 year for Dantrolene and Baclofen.   2. PT eval/treat- for Home exercise plan- will order PT eval for Home exercise program.   3. F/U with Surgeon as scheduled.   4. Stand on heels- B/L- multiple times per day- to work on Psychologist, educational.   5. Showed /demonstrated how to do hamstring stretches- and also can use tennis balls- to hold pressure on hamstrings/back of thighs- ~ 10 minutes- can also sit on tennis ball.   6. F/U- in 6 months.

## 2020-11-16 ENCOUNTER — Ambulatory Visit: Payer: Medicare Other | Attending: Physical Medicine and Rehabilitation | Admitting: Physical Therapy

## 2020-11-16 ENCOUNTER — Encounter: Payer: Self-pay | Admitting: Physical Therapy

## 2020-11-16 ENCOUNTER — Other Ambulatory Visit: Payer: Self-pay

## 2020-11-16 DIAGNOSIS — R2689 Other abnormalities of gait and mobility: Secondary | ICD-10-CM | POA: Diagnosis present

## 2020-11-16 DIAGNOSIS — M6281 Muscle weakness (generalized): Secondary | ICD-10-CM | POA: Insufficient documentation

## 2020-11-16 DIAGNOSIS — M62838 Other muscle spasm: Secondary | ICD-10-CM | POA: Insufficient documentation

## 2020-11-16 DIAGNOSIS — M21372 Foot drop, left foot: Secondary | ICD-10-CM | POA: Insufficient documentation

## 2020-11-16 NOTE — Therapy (Signed)
[facility] [address]  [place], [place], [address] Phone: [phone]   Fax:  [phone]  Physical Therapy Evaluation  Patient Details  Name: Bryan Lane. MRN: [id number] Date of Birth: [birth year] Referring Provider (PT): Courtney Heys, MD   Encounter Date: 11/16/2020   PT End of Session - 11/16/20 1153    Visit Number 1    Number of Visits 1    Date for PT Re-Evaluation 11/16/20    Authorization Type [employer] Medicare & Tricare    PT Start Time 0857    PT Stop Time 0954    PT Time Calculation (min) 57 min    Activity Tolerance Patient tolerated treatment well    Behavior During Therapy [facility] for tasks assessed/performed           Past Medical History:  Diagnosis Date  . Bowel incontinence    due to cervical disc issue  . Depression    with pain  . Dyspnea    with  exertion and pain  . Hyperlipidemia   . Hypertension    2 years ago was on med for bp. hctz.  . Muscle cramping 01/14/2018  . Muscle spasticity   . Neuromuscular disorder ([place])    Spinal cord injury  . Paresthesia 01/14/2018  . PTSD (post-traumatic stress disorder)   . Spastic gait 01/14/2018  . Spasticity 08/04/2019  . Spondylolisthesis of lumbar region 02/06/2019  . Stenosis of cervical spine with myelopathy ([place]) 01/31/2018  . Urine incontinence 01/2018   due to cervical issue    Past Surgical History:  Procedure Laterality Date  . ANTERIOR CERVICAL DECOMPRESSION/DISCECTOMY FUSION 4 LEVELS N/A 01/31/2018   Procedure: ANTERIOR CERVICAL DECOMPRESSION/DISCECTOMY FUSION, INTERBODY PROSTHESIS, PLATE/SCREWS CERVICAL THREE- CERVICAL FOUR, CERVICAL FOUR - CERVICAL FIVE, CERVICAL FIVE - CERVICAL SIX, CERVICAL SIX- CERVICAL SEVEN;  Surgeon: Newman Pies, MD;  Location: [facility];  Service: Neurosurgery;  Laterality: N/A;  ANTERIOR CERVICAL DECOMPRESSION/DISCECTOMY FUSION, INTERBODY PROSTHESIS, PLATE/SCREWS CER  . HAND SURGERY Right    "BB  removal"  . SHOULDER SURGERY Left 2017   acl repair  . Wisdom teeth removal      There were no vitals filed for this visit.    Subjective Assessment - 11/16/20 0858    Subjective Patient reports that he had a lumbar fusion on 10/27/20. Reports that he was "dropping my toes" from an issue in his neck. Subsequently has a cervical surgery 01/2018 which improved the foot drop. Followed with a L3/4 lumbar surgery on 01/2019. Notes that he still experienced full-body spasticity and imbalance after this surgery. Most recent surgery improved his ability to lift his L toes up. Would like to learn how to "strengthen my back" without hurting it. No longer dealing with pain, every once in a while will have a "jab" in the LB. Still has some constant N/T in B toes and intermittently in R foot. Had 1 episode of his LEs collapsing and falling on hands and knees after standing too long since his surgery. Pain worse in AM. Better with meds. No longer using SPC.    Pertinent History cervical stenosis w/ myelopathy, lumbar spondylolisthesis, spasticity, PTSD, paresthesia, incomplete quadriplegia, HTN, HLD, depression, ACDF C3-4, C4-5, C5-6, C6-7 2019, R hand surgery, L shouder surgery    Limitations Lifting;Standing;Walking;House hold activities    How long can you stand comfortably? 15-20 min    How long can you walk comfortably? 20 min    Diagnostic tests none recent  Patient Stated Goals "learn how to strengthen my back"    Currently in Pain? Yes    Pain Score 0-No pain    Pain Location Back    Pain Orientation Left    Pain Descriptors / Indicators Sharp;Aching    Pain Type Acute pain;Surgical pain              [facility] PT Assessment - 11/16/20 0910      Assessment   Medical Diagnosis Aquired L foot drop    Referring Provider (PT) Courtney Heys, MD    Onset Date/Surgical Date 10/27/20    Next MD Visit in 6 months    Prior Therapy yes      Precautions   Precautions Back;Fall   spinal precautions      Balance Screen   Has the patient fallen in the past 6 months Yes    How many times? 1    Has the patient had a decrease in activity level because of a fear of falling?  Yes    Is the patient reluctant to leave their home because of a fear of falling?  No      Home Environment   Living Environment Private residence    Living Arrangements Alone    Additional Comments living with 32 y/o dad; will be moving back to [facility] to his trailer- 3 steps in with handle to hold onto; owns [employer] [facility]      Prior Function   Level of Independence Independent    Vocation On disability    Leisure kayaking      Cognition   Overall Cognitive Status Within Functional Limits for tasks assessed      Observation/Other Assessments   Observations lumbar incision well-healed; large protrusion under incision which is nonpainful and without redness or warmth      Sensation   Light Touch Appears Intact      Coordination   Gross Motor Movements are Fluid and Coordinated Yes      Posture/Postural Control   Posture/Postural Control Postural limitations    Postural Limitations Rounded Shoulders      ROM / Strength   AROM / PROM / Strength Strength;AROM      AROM   AROM Assessment Site Ankle    Right/Left Ankle Right;Left    Right Ankle Dorsiflexion 12    Right Ankle Plantar Flexion 54    Left Ankle Dorsiflexion 4    Left Ankle Plantar Flexion 60      Strength   Strength Assessment Site Hip;Knee;Ankle    Right/Left Hip Right;Left    Right Hip Flexion 4+/5    Right Hip ABduction 4+/5    Right Hip ADduction 4/5    Left Hip Flexion 4+/5    Left Hip ABduction 4/5    Left Hip ADduction 4/5    Right/Left Knee Right;Left    Right Knee Flexion 5/5    Right Knee Extension 4+/5    Left Knee Flexion 4+/5    Left Knee Extension 4/5    Right/Left Ankle Right;Left    Right Ankle Dorsiflexion 4+/5    Right Ankle Plantar Flexion 4+/5    Left Ankle Dorsiflexion 4/5    Left Ankle Plantar Flexion 4+/5       Flexibility   Soft Tissue Assessment /Muscle Length yes    Hamstrings B WFL    Quadriceps B WFL    Piriformis L WFL; R moderately tight in KTOS and fig 4      Palpation   Palpation  comment no TTP over lumbar or posterior chain muscles; increased tension in R QL      Ambulation/Gait   Gait Pattern Step-through pattern;Poor foot clearance - left   aduible foot slap on L   Ambulation Surface Level;Indoor    Gait velocity slightly decreased                      Objective measurements completed on examination: See above findings.               PT Education - 11/16/20 1152    Education Details prognosis, HEP, edu on post-op precautions including no heavy lifting, bending, twisting and edu on log roll; encouraged patient to re-establish care with a PT in [place] for max benefit    Person(s) Educated Patient    Methods Explanation;Demonstration;Tactile cues;Verbal cues;Handout    Comprehension Verbalized understanding;Returned demonstration            PT Short Term Goals - 11/16/20 1217      PT SHORT TERM GOAL #1   Title Patient to report understanding of HEP.    Status Achieved    Target Date 11/16/20             PT Long Term Goals - 11/16/20 1217      PT LONG TERM GOAL #1   Title Patient to report understanding of HEP.    Status Achieved    Target Date 11/16/20                  Plan - 11/16/20 1209    Clinical Impression Statement Patient is a 60 y/o F presenting to OPPT with c/o imbalance and L foot drop s/p L3/4 fusion on 10/27/20. Patient deals with chronic spasticity from cervical myelopathy which was surgically treated in 2019. Patient also with hx of L4/5 fusion in 2020. Patient notes improvement in L foot drop since surgery, however weakness and imbalance still remain. Notes minimal pain but still notes paresthesias in B feet. Patient reports 1 recent fall but no longer ambulates with SPC. Patient today presenting with rounded shoulders,  limited L ankle dorsiflexion AROM, marked L LE weakness, R piriformis tightness, increased tension in R QL, and gait deviations. Patient was educated on gentle stretching and strengthening HEP as well as on post-op precautions and log roll- patient reported understanding. Patient requesting 1 time visit today as he plans to move back to [facility] next week.    Personal Factors and Comorbidities Age;Comorbidity 3+;Fitness;Past/Current Experience;Time since onset of injury/illness/exacerbation    Comorbidities cervical stenosis w/ myelopathy, lumbar spondylolisthesis, spasticity, PTSD, paresthesia, incomplete quadriplegia, HTN, HLD, depression, ACDF C3-4, C4-5, C5-6, C6-7 2019, R hand surgery, L shouder surgery    Examination-Activity Limitations Bend;Squat;Stairs;Carry;Stand;Toileting;Dressing;Transfers;Hygiene/Grooming;Lift;Locomotion Level;Reach Overhead;Bed Mobility;Bathing    Examination-Participation Restrictions Church;Cleaning;Interpersonal Relationship;Driving;Laundry;Meal Prep;Shop    Stability/Clinical Decision Making Stable/Uncomplicated    Clinical Decision Making Low    Rehab Potential Good    PT Frequency One time visit    PT Treatment/Interventions ADLs/Self Care Home Management;Cryotherapy;Electrical Stimulation;Moist Heat;Balance training;Therapeutic exercise;Therapeutic activities;Stair training;Gait training;Ultrasound;Neuromuscular re-education;Patient/family education;Manual techniques;Taping;Energy conservation;Dry needling;Passive range of motion    PT Next Visit Plan 1 time visit    Consulted and Agree with Plan of Care Patient           Patient will benefit from skilled therapeutic intervention in order to improve the following deficits and impairments:  Abnormal gait,Hypomobility,Decreased knowledge of precautions,Pain,Increased fascial restricitons,Decreased strength,Decreased activity tolerance,Decreased balance,Increased muscle spasms,Improper body mechanics,Decreased range of  motion,Postural  dysfunction,Impaired flexibility  Visit Diagnosis: Foot drop, left  Muscle weakness (generalized)  Other abnormalities of gait and mobility  Other muscle spasm     Problem List Patient Active Problem List   Diagnosis Date Noted  . Impaired functional mobility, balance, gait, and endurance 04/30/2020  . Coronary artery calcification seen on CT scan 03/30/2020  . Essential hypertension 03/30/2020  . Cardiac murmur 03/30/2020  . Bowel incontinence   . Depression   . Dyspnea   . Hyperlipidemia   . Hypertension   . Muscle spasticity   . Neuromuscular disorder ([place])   . PTSD (post-traumatic stress disorder)   . Spasticity 08/04/2019  . Spondylolisthesis of lumbar region 02/06/2019  . Stenosis of cervical spine with myelopathy ([place]) 01/31/2018  . Urine incontinence 01/2018  . Muscle cramping 01/14/2018  . Spastic gait 01/14/2018  . Paresthesia 01/14/2018     Janene Harvey, PT, DPT 11/16/20 12:19 PM   [facility] [address]  [place], [place], [address] Phone: [phone]   Fax:  [phone]  Name: Bryan Lane. MRN: [id number] Date of Birth: [birth year]

## 2020-11-29 ENCOUNTER — Ambulatory Visit: Payer: Medicare Other | Admitting: Physical Therapy

## 2020-12-15 ENCOUNTER — Ambulatory Visit: Payer: TRICARE For Life (TFL) | Admitting: Physical Medicine and Rehabilitation

## 2020-12-27 ENCOUNTER — Other Ambulatory Visit: Payer: Self-pay | Admitting: Medical

## 2021-01-25 ENCOUNTER — Other Ambulatory Visit: Payer: Self-pay | Admitting: Medical

## 2021-03-09 IMAGING — MR MRI LUMBAR SPINE WITHOUT CONTRAST
4 of 5 series · 25 of 48 positions shown · non-contrast
Comparison: None.

CLINICAL DATA: Acute right-sided low back pain with right-sided
sciatica.

EXAM:
MRI LUMBAR SPINE WITHOUT CONTRAST
TECHNIQUE: Multiplanar, multisequence MR imaging of the lumbar spine was
performed. No intravenous contrast was administered.

[Series 2: T2 · sagittal · 4.0mm · 0.81mm/px · 6 of 17 slices shown (1 of 2)]
[im 1/17]
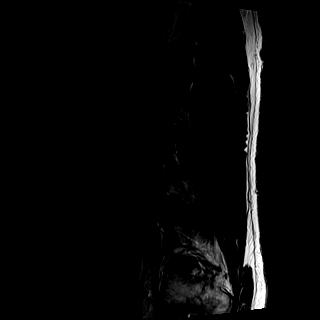
[im 4/17]
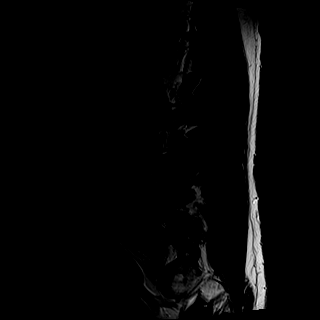
[im 7/17]
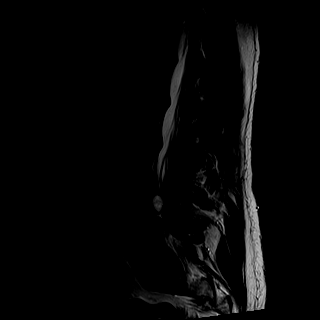
[im 10/17]
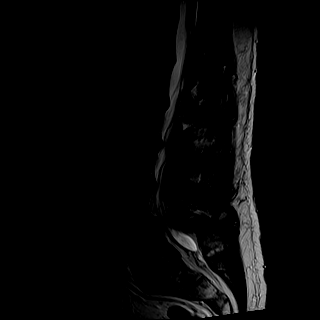
[im 13/17]
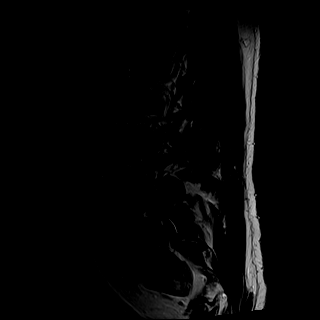
[im 17/17]
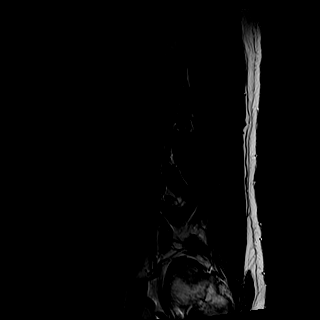

[Series 3: T1 · sagittal · 4.0mm · 0.41mm/px · 6 of 17 slices shown (1 of 2)]
[im 1/17]
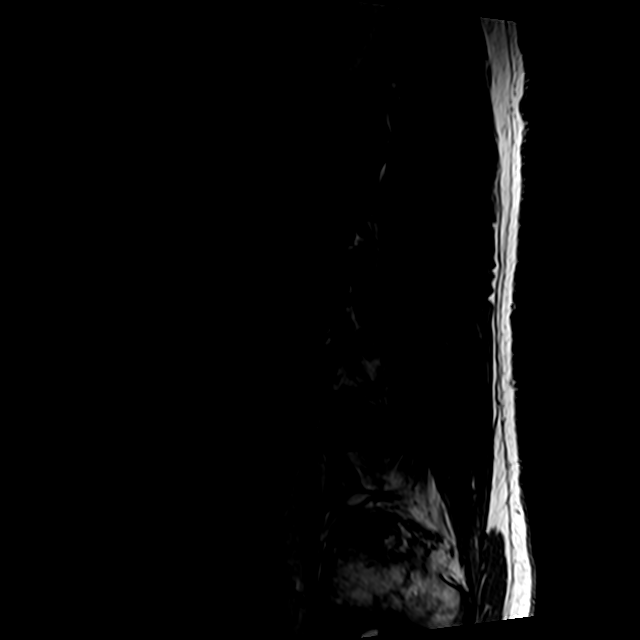
[im 4/17]
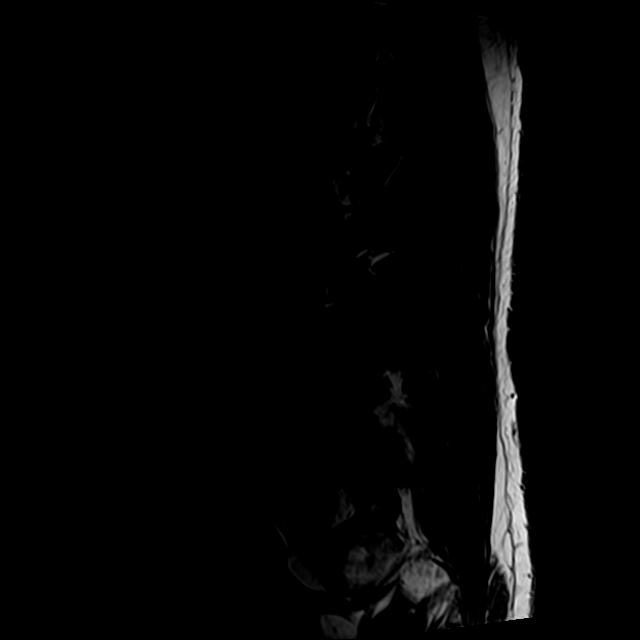
[im 7/17]
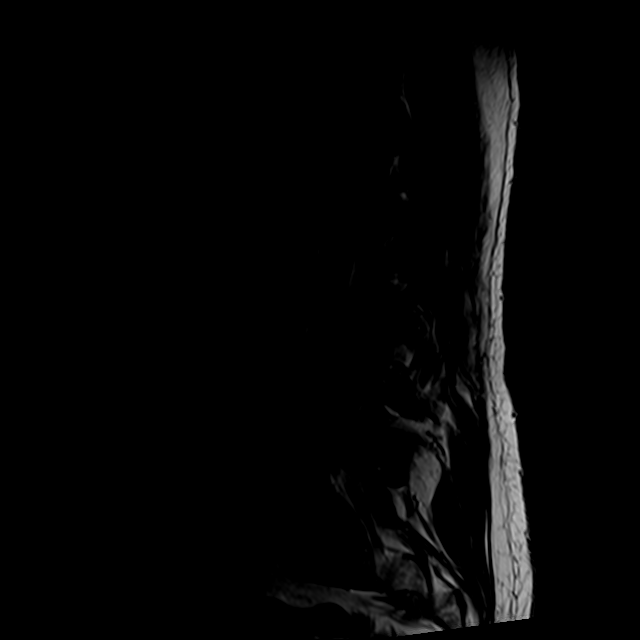
[im 10/17]
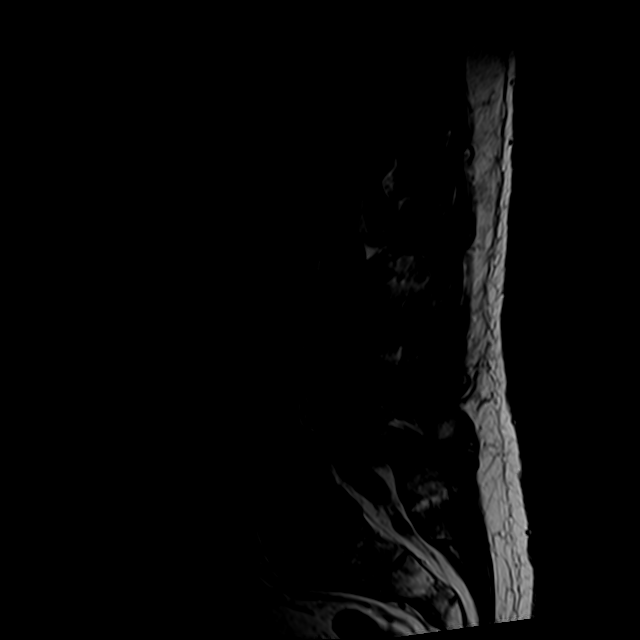
[im 13/17]
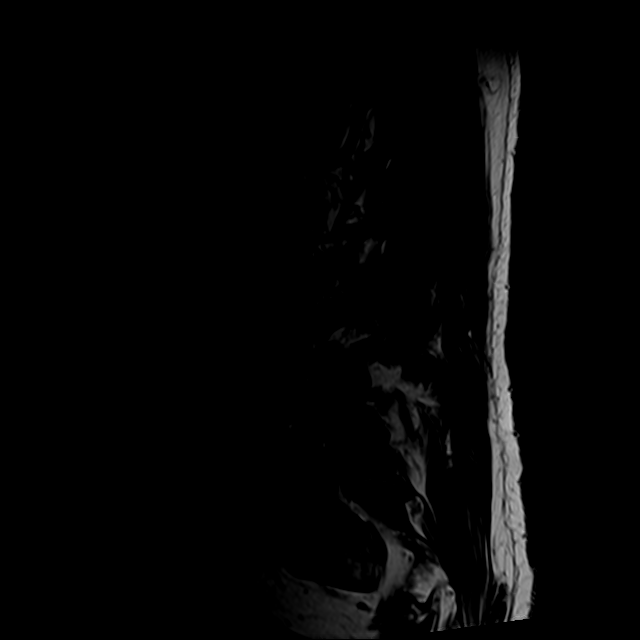
[im 17/17]
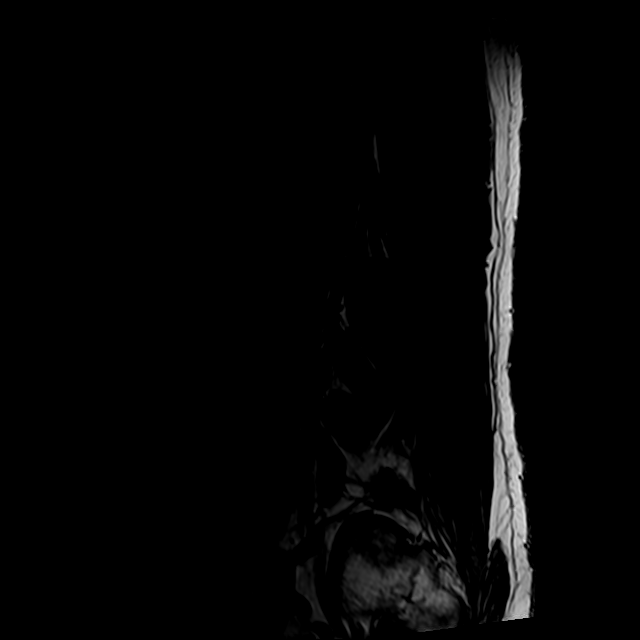

[Series 5: T2 · axial · 4.0mm · 0.78mm/px · z∈[-56,+160]mm · 9 of 40 slices shown (2 of 2)]
[im 1/40]
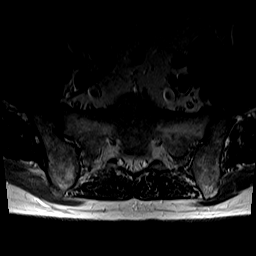
[im 6/40]
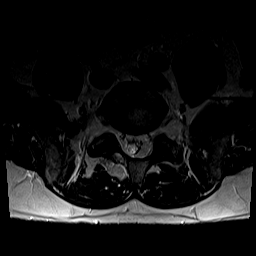
[im 12/40]
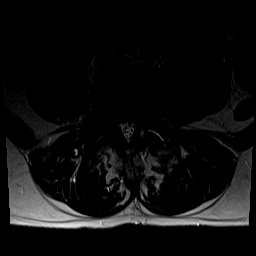
[im 17/40]
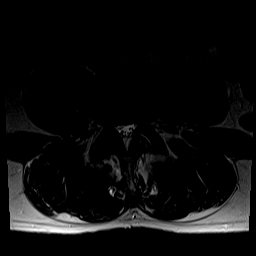
[im 20/40]
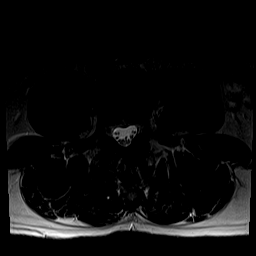
[im 23/40]
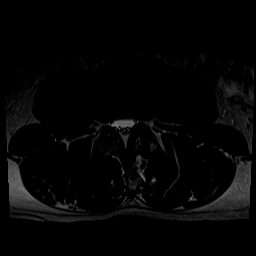
[im 28/40]
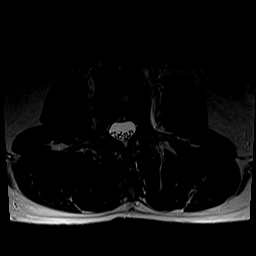
[im 34/40]
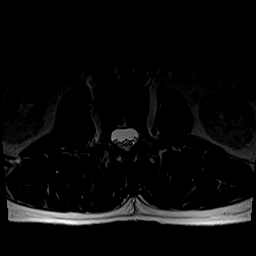
[im 40/40]
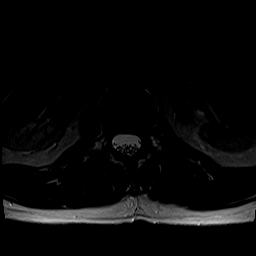

[Series 6: T1 · axial · 4.0mm · 0.39mm/px · z∈[-56,+130]mm · 4 of 40 slices shown (2 of 2)]
[im 1/40]
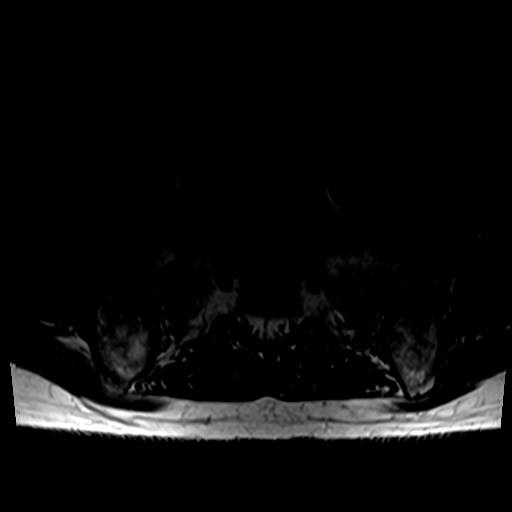
[im 6/40]
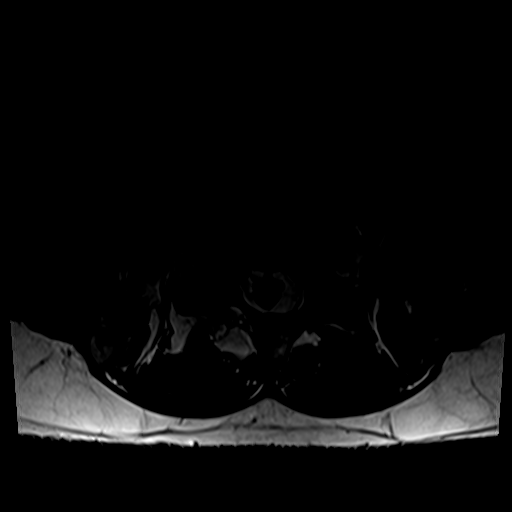
[im 20/40]
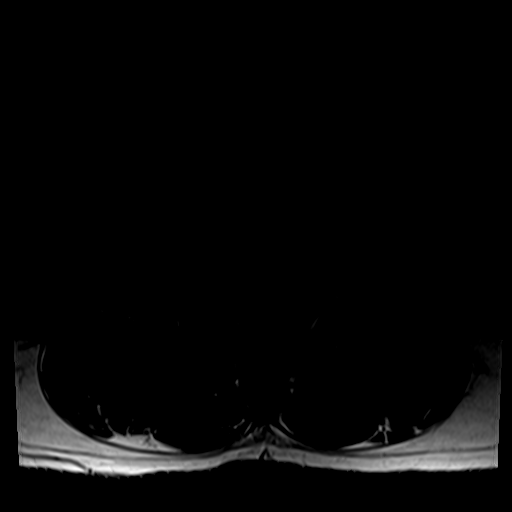
[im 34/40]
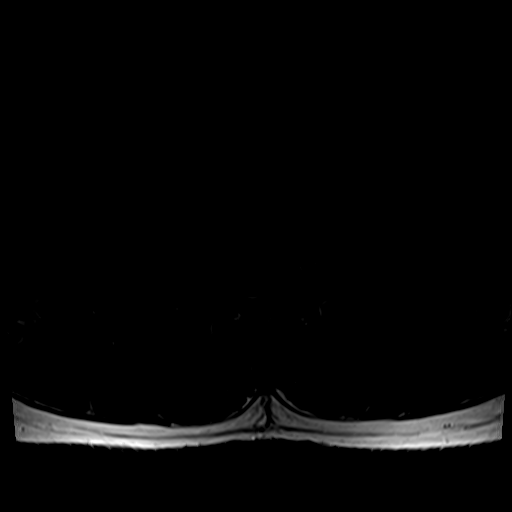

[25 of 48 positions shown; findings below may reference images not displayed]

FINDINGS: Segmentation: 5 non rib-bearing lumbar type vertebral bodies are
present. The lowest fully formed vertebral body is L5.

Alignment: Grade 1 degenerative anterolisthesis at L4-5 measures 4
mm. There is slight retrolisthesis L1-2, L2-3, and most
significantly at L3-4. The retrolisthesis at L3-4 measures 4 mm.
Rightward curvature is centered at L3.

Vertebrae:  Marrow signal and vertebral body heights are normal.

Conus medullaris and cauda equina: Conus extends to the T12 level.
Conus and cauda equina appear normal.

Paraspinal and other soft tissues: Limited imaging the abdomen is
unremarkable. There is no significant adenopathy. No solid organ
lesions are present.

Disc levels:

L1-2: Mild disc bulging is present. There is no significant
stenosis.

L2-3: Mild disc bulging is asymmetric to the left. There is no
significant stenosis.

L3-4: A broad-based disc protrusion is present. Mild subarticular
narrowing is present on the left. Mild right foraminal narrowing is
present.

L4-5: Advanced facet hypertrophy is present. A large synovial cyst
extends superiorly from the disc level. There is severe right
subarticular and lateral recess narrowing, likely impacting the L4
and L5 nerve roots. Facet spurring contributes to severe right
foraminal stenosis. Moderate left foraminal narrowing is present.

L5-S1: Facet hypertrophy is present bilaterally. No significant disc
protrusion or stenosis is present.
IMPRESSION: 1. Advanced facet hypertrophy and grade 1 anterolisthesis at L4-5.
2. Large synovial cyst emanating from the L4-5 facet extends into
the canal resulting in severe right subarticular and lateral recess
narrowing. This likely impacts the right L4 and L5 nerve roots.
3. Moderate left foraminal stenosis at L4-5.
4. Mild left subarticular and right foraminal stenosis at L3-4.
5. Moderate facet hypertrophy at L5-S1 without significant stenosis.

## 2021-03-29 ENCOUNTER — Telehealth: Payer: Self-pay | Admitting: *Deleted

## 2021-03-29 NOTE — Telephone Encounter (Signed)
Patient called to cancel - he is out of town.

## 2021-03-31 ENCOUNTER — Inpatient Hospital Stay: Payer: Medicare Other

## 2021-03-31 ENCOUNTER — Ambulatory Visit: Payer: TRICARE For Life (TFL) | Admitting: Hematology & Oncology

## 2021-04-01 ENCOUNTER — Telehealth: Payer: Self-pay | Admitting: General Surgery

## 2021-04-01 NOTE — Telephone Encounter (Signed)
LVM for the patient to contact our office to schedule a CMA pre-visit and colonoscopy.

## 2021-04-28 ENCOUNTER — Telehealth: Payer: Self-pay | Admitting: Medical

## 2021-04-28 ENCOUNTER — Telehealth: Payer: Self-pay

## 2021-04-28 MED ORDER — ATORVASTATIN CALCIUM 10 MG PO TABS: 10.0000 mg | ORAL_TABLET | Freq: Every day | ORAL | 3 refills | Status: DC

## 2021-04-28 MED ORDER — CHLORTHALIDONE 25 MG PO TABS: 25.0000 mg | ORAL_TABLET | Freq: Every day | ORAL | 3 refills | Status: DC

## 2021-04-28 MED ORDER — LOSARTAN POTASSIUM 25 MG PO TABS: 75.0000 mg | ORAL_TABLET | Freq: Every day | ORAL | 3 refills | Status: DC

## 2021-04-28 NOTE — Telephone Encounter (Signed)
Pt. Needs refill for following medications. He is in [place] and needs refills while he is here. This is a one time thing.  Medication:  losartan (COZAAR) 25 MG tablet  torvastatin (LIPITOR) 10 MG tablet  chlorthalidone (HYGROTON) 25 MG tablet  Has the patient contacted their pharmacy? No. (If no, request that the patient contact the pharmacy for the refill.) (If yes, when and what did the pharmacy advise?)  Preferred Pharmacy (with phone number or street name):  [facility], [place] - [address]  [address], [place] [address]  Phone:  [phone]  Fax:  [phone]   Agent: Please be advised that RX refills may take up to 3 business days. We ask that you follow-up with your pharmacy.

## 2021-04-28 NOTE — Telephone Encounter (Signed)
Rx sent 

## 2021-04-29 ENCOUNTER — Ambulatory Visit: Admitting: Physical Medicine and Rehabilitation

## 2021-04-29 MED ORDER — DIAZEPAM 5 MG PO TABS: 5.0000 mg | ORAL_TABLET | Freq: Three times a day (TID) | ORAL | 5 refills | Status: DC | PRN

## 2021-04-29 NOTE — Telephone Encounter (Signed)
Message left on patient  confidential voicemail: Rx sent. Please call pharmacy to see when it can be released.

## 2021-05-04 ENCOUNTER — Encounter
Payer: Medicare Other | Attending: Physical Medicine and Rehabilitation | Admitting: Physical Medicine and Rehabilitation

## 2021-05-04 ENCOUNTER — Other Ambulatory Visit: Payer: Self-pay

## 2021-05-04 ENCOUNTER — Encounter: Payer: Self-pay | Admitting: Physical Medicine and Rehabilitation

## 2021-05-04 VITALS — BP 101/68 | HR 99 | Ht 74.0 in | Wt 211.4 lb

## 2021-05-04 DIAGNOSIS — R261 Paralytic gait: Secondary | ICD-10-CM | POA: Insufficient documentation

## 2021-05-04 DIAGNOSIS — R202 Paresthesia of skin: Secondary | ICD-10-CM | POA: Diagnosis present

## 2021-05-04 DIAGNOSIS — M5000 Cervical disc disorder with myelopathy, unspecified cervical region: Secondary | ICD-10-CM | POA: Diagnosis present

## 2021-05-04 DIAGNOSIS — R252 Cramp and spasm: Secondary | ICD-10-CM | POA: Insufficient documentation

## 2021-05-04 DIAGNOSIS — Z7409 Other reduced mobility: Secondary | ICD-10-CM | POA: Diagnosis present

## 2021-05-04 MED ORDER — DANTROLENE SODIUM 25 MG PO CAPS: 25.0000 mg | ORAL_CAPSULE | Freq: Two times a day (BID) | ORAL | 3 refills | Status: DC

## 2021-05-04 MED ORDER — BACLOFEN 10 MG PO TABS: 10.0000 mg | ORAL_TABLET | Freq: Four times a day (QID) | ORAL | 3 refills | Status: DC

## 2021-05-04 NOTE — Patient Instructions (Addendum)
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  Here for spasticity f/u as well as incoordination and parasthesia  and f/u after L3/L4 fusion. In summer 2022.  Here for f/u on spasticity/SCI.   Will increase baclofen to 10 mg 3-4x/day- for spasticity.  Increase dose for now since spasticity getting worse.   2. Will restart Dantrolene 25mg  2x/day- since hadn't been taking- wait on CMP since hasn't been taking the Dantrolene.  Might need a prior authorization from insurance.   3. Just refilled Valium 5 mg TID- for spasms/spasticity.    4. Will check CMP at next visit- needs to have CMP checked in 6 months.   5. F/U in 6 months- double visit- SCI

## 2021-05-04 NOTE — Progress Notes (Signed)
Subjective:    Patient ID: Bryan Lane., male    DOB: 1960-09-14, 60 y.o.   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  Here for spasticity f/u as well as incoordination and parasthesia  and f/u after L3/L4 fusion. In summer 2022.  Here for f/u on spasticity/SCI.   Did get HEP from [facility]- only went once.  Not doing Hep anymore- but getting plenty of motion, moving around- walks until "can't walk anymore".  Cramps are getting worse in legs so walking every 2 hours.   1/2 Baclofen worked for [employer], but started back again.  Taking a full tablet now- 10 mg TID. Didn't get Dantrolene-  because didn't go to the pharmacy, for some reason- but I have record I sent it in.   Spasticity and cramps are getting worse-  Revitive- electrical pads for feet- stimulates the muscles-  Hasn't had a cramp since started using it. Works so well for legs/feet, but hands also cramping a lot- esp if has to hold something for a long time and then hard to reopen hand when gripping.  Finds if works muscle til exhaustion, helps the cramps/spasms.  Just got Valium refilled.   Pain Inventory Average Pain 3 Pain Right Now 0 My pain is aching  In the last 24 hours, has pain interfered with the following? General activity 7 Relation with others 3 Enjoyment of life 7 What TIME of day is your pain at its worst? morning  and night Sleep (in general) Fair  Pain is worse with: walking, bending, and standing Pain improves with: therapy/exercise, pacing activities, and medication Relief from Meds: 5  Family History  Problem Relation Age of Onset   Diabetes Father    Colon polyps Father    Heart attack Mother    Alcoholism Mother    Hypertension Sister    Colon cancer Neg Hx    Esophageal cancer Neg Hx    Rectal cancer Neg Hx    Stomach cancer Neg Hx    Social History    Socioeconomic History   Marital status: Single    Spouse name: Not on file   Number of children: 2   Years of education: 16   Highest education level: Bachelor's degree (e.g., BA, AB, BS)  Occupational History   Occupation: applying for disability  Tobacco Use   Smoking status: Every Day    Packs/day: 0.50    Years: 40.00    Pack years: 20.00    Types: Cigarettes   Smokeless tobacco: Never  Vaping Use   Vaping Use: Every day  Substance and Sexual Activity   Alcohol use: Yes    Comment: Socially   Drug use: Yes    Types: Marijuana    Comment: last 01/28/18    Sexual activity: Not Currently  Other Topics Concern   Not on file  Social History Narrative   Lives with dad and stepmother in a one story home.  Has 2 children.     Applying for disability.  Education: BS   Social Determinants of Health   Financial Resource Strain: Not on file  Food Insecurity: Not on file  Transportation Needs: Not on file  Physical Activity: Not on file  Stress: Not on file  Social Connections: Not on file   Past Surgical History:  Procedure Laterality Date   ANTERIOR CERVICAL  DECOMPRESSION/DISCECTOMY FUSION 4 LEVELS N/A 01/31/2018   Procedure: ANTERIOR CERVICAL DECOMPRESSION/DISCECTOMY FUSION, INTERBODY PROSTHESIS, PLATE/SCREWS CERVICAL THREE- CERVICAL FOUR, CERVICAL FOUR - CERVICAL FIVE, CERVICAL FIVE - CERVICAL SIX, CERVICAL SIX- CERVICAL SEVEN;  Surgeon: Newman Pies, MD;  Location: [facility];  Service: Neurosurgery;  Laterality: N/A;  ANTERIOR CERVICAL DECOMPRESSION/DISCECTOMY FUSION, INTERBODY PROSTHESIS, PLATE/SCREWS CER   HAND SURGERY Right    "BB removal"   SHOULDER SURGERY Left 2017   acl repair   Wisdom teeth removal     Past Surgical History:  Procedure Laterality Date   ANTERIOR CERVICAL DECOMPRESSION/DISCECTOMY FUSION 4 LEVELS N/A 01/31/2018   Procedure: ANTERIOR CERVICAL DECOMPRESSION/DISCECTOMY FUSION, INTERBODY PROSTHESIS, PLATE/SCREWS CERVICAL THREE- CERVICAL FOUR,  CERVICAL FOUR - CERVICAL FIVE, CERVICAL FIVE - CERVICAL SIX, CERVICAL SIX- CERVICAL SEVEN;  Surgeon: Newman Pies, MD;  Location: [facility];  Service: Neurosurgery;  Laterality: N/A;  ANTERIOR CERVICAL DECOMPRESSION/DISCECTOMY FUSION, INTERBODY PROSTHESIS, PLATE/SCREWS CER   HAND SURGERY Right    "BB removal"   SHOULDER SURGERY Left 2017   acl repair   Wisdom teeth removal     Past Medical History:  Diagnosis Date   Bowel incontinence    due to cervical disc issue   Depression    with pain   Dyspnea    with  exertion and pain   Hyperlipidemia    Hypertension    2 years ago was on med for bp. hctz.   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/31/2018   Urine incontinence 01/2018   due to cervical issue   BP 101/68   Pulse 99   Ht 6\' 2"  (1.88 m)   Wt 211 lb 6.4 oz (95.9 kg)   SpO2 99%   BMI 27.14 kg/m   Opioid Risk Score:   Fall Risk Score:  `1  Depression screen PHQ 2/9  Depression screen [facility] 2/9 05/04/2021 09/27/2020 04/30/2020 01/28/2020 10/29/2019 08/04/2019  Decreased Interest 0 3 3 1 2 2   Down, Depressed, Hopeless 0 2 3 1 2 2   PHQ - 2 Score 0 5 6 2 4 4   Altered sleeping - 1 - - - 3  Tired, decreased energy - 3 - - - 3  Change in appetite - 2 - - - 0  Feeling bad or failure about yourself  - 3 - - - 1  Trouble concentrating - 1 - - - 0  Moving slowly or fidgety/restless - 1 - - - 0  Suicidal thoughts - 1 - - - 1  PHQ-9 Score - 17 - - - 12  Difficult doing work/chores - Somewhat difficult - - - -     Review of Systems  Musculoskeletal:  Positive for back pain.  All other systems reviewed and are negative.     Objective:   Physical Exam Awake, alert, appropriate, concerns about spasticity,  NAD  MS: 5-/5 in LE's B/L  Ue's 5-/5 B/L   Neuro: No clonus B/L, however MAS of 2 in knees/hips and 1+ in ankles No hoffman's B/L; however MAS of 1 in Ue's.        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  Here for spasticity f/u as well as incoordination and parasthesia  and f/u after L3/L4 fusion. In summer 2022.  Here for f/u on spasticity/SCI.   Will increase baclofen to 10 mg 3-4x/day- for spasticity.  Increase dose for now since spasticity getting worse.   2. Will restart Dantrolene 25mg  2x/day- since hadn't been taking- wait on CMP since hasn't been taking the Dantrolene.  Might need a prior authorization from insurance.   3. Just refilled Valium 5 mg TID- for spasms/spasticity.    4. Will check CMP at next visit- needs to have CMP checked in 6 months.   5. F/U in 6 months- double visit Getting Flu and omicron covid shots  I spent a total of 21 minutes on total visit- as detailed above- discussing how can be seen here for labs in 6 months.

## 2021-05-06 ENCOUNTER — Telehealth: Payer: Self-pay | Admitting: Medical

## 2021-05-06 ENCOUNTER — Encounter: Payer: Self-pay | Admitting: Medical

## 2021-05-06 MED ORDER — SILVER SULFADIAZINE 1 % EX CREA: 1.0000 "application " | TOPICAL_CREAM | Freq: Every day | CUTANEOUS | 1 refills | Status: DC
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
## 2021-05-09 ENCOUNTER — Other Ambulatory Visit: Payer: Self-pay

## 2021-05-09 ENCOUNTER — Ambulatory Visit: Payer: Medicare Other | Admitting: Medical

## 2021-05-11 ENCOUNTER — Other Ambulatory Visit: Payer: Self-pay

## 2021-05-11 ENCOUNTER — Encounter: Payer: Self-pay | Admitting: Medical

## 2021-05-11 ENCOUNTER — Ambulatory Visit (INDEPENDENT_AMBULATORY_CARE_PROVIDER_SITE_OTHER): Payer: Medicare Other | Admitting: Medical

## 2021-05-11 VITALS — BP 116/67 | HR 95 | Temp 97.5°F | Resp 18 | Ht 74.0 in | Wt 206.0 lb

## 2021-05-11 DIAGNOSIS — L089 Local infection of the skin and subcutaneous tissue, unspecified: Secondary | ICD-10-CM

## 2021-05-11 DIAGNOSIS — E785 Hyperlipidemia, unspecified: Secondary | ICD-10-CM | POA: Diagnosis not present

## 2021-05-11 DIAGNOSIS — R739 Hyperglycemia, unspecified: Secondary | ICD-10-CM

## 2021-05-11 DIAGNOSIS — T300 Burn of unspecified body region, unspecified degree: Secondary | ICD-10-CM | POA: Diagnosis not present

## 2021-05-11 LAB — COMPREHENSIVE METABOLIC PANEL
ALT: 30 U/L (ref 0–53)
AST: 29 U/L (ref 0–37)
Albumin: 4.6 g/dL (ref 3.5–5.2)
Alkaline Phosphatase: 52 U/L (ref 39–117)
BUN: 24 mg/dL — ABNORMAL HIGH (ref 6–23)
CO2: 30 mEq/L (ref 19–32)
Calcium: 9.8 mg/dL (ref 8.4–10.5)
Chloride: 100 mEq/L (ref 96–112)
Creatinine, Ser: 1.18 mg/dL (ref 0.40–1.50)
GFR: 68.12 mL/min (ref >60.00)
Glucose, Bld: 108 mg/dL — ABNORMAL HIGH (ref 70–99)
Potassium: 4 mEq/L (ref 3.5–5.1)
Sodium: 137 mEq/L (ref 135–145)
Total Bilirubin: 0.4 mg/dL (ref 0.2–1.2)
Total Protein: 6.8 g/dL (ref 6.0–8.3)

## 2021-05-11 LAB — LIPID PANEL
Cholesterol: 129 mg/dL (ref 0–200)
HDL: 37.6 mg/dL — ABNORMAL LOW (ref >39.00)
LDL Cholesterol: 73 mg/dL (ref 0–99)
NonHDL: 90.96
Total CHOL/HDL Ratio: 3
Triglycerides: 90 mg/dL (ref 0.0–149.0)
VLDL: 18 mg/dL (ref 0.0–40.0)

## 2021-05-11 LAB — HEMOGLOBIN A1C: Hgb A1c MFr Bld: 6.1 % (ref 4.6–6.5)

## 2021-05-11 MED ORDER — SILVER SULFADIAZINE 1 % EX CREA: 1.0000 "application " | TOPICAL_CREAM | Freq: Every day | CUTANEOUS | 1 refills | Status: DC
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
## 2021-05-11 NOTE — Addendum Note (Signed)
Addended by: Anabel Halon on: 05/11/2021 11:28 AM   Modules accepted: Orders, Level of Service

## 2021-05-14 LAB — WOUND CULTURE
MICRO NUMBER:: 12675399
SPECIMEN QUALITY:: ADEQUATE

## 2021-05-26 ENCOUNTER — Encounter: Payer: Self-pay | Admitting: Physical Medicine and Rehabilitation

## 2021-05-26 ENCOUNTER — Other Ambulatory Visit: Payer: Self-pay | Admitting: Physical Medicine and Rehabilitation

## 2021-05-26 ENCOUNTER — Encounter: Payer: Self-pay | Admitting: Medical

## 2021-06-08 ENCOUNTER — Ambulatory Visit: Payer: Medicare Other | Admitting: Physical Medicine and Rehabilitation

## 2021-06-21 ENCOUNTER — Ambulatory Visit (INDEPENDENT_AMBULATORY_CARE_PROVIDER_SITE_OTHER): Payer: Medicare Other

## 2021-06-21 VITALS — Ht 74.0 in | Wt 206.0 lb

## 2021-06-21 DIAGNOSIS — Z0000 Encounter for general adult medical examination without abnormal findings: Secondary | ICD-10-CM | POA: Diagnosis not present

## 2021-06-21 NOTE — Patient Instructions (Signed)
Bryan Lane , Thank you for taking time to complete your Medicare Wellness Visit. I appreciate your ongoing commitment to your health goals. Please review the following plan we discussed and let me know if I can assist you in the future.   Screening recommendations/referrals: Colonoscopy: Due-Declined today. Recommended yearly ophthalmology/optometry visit for glaucoma screening and checkup Recommended yearly dental visit for hygiene and checkup  Vaccinations: Influenza vaccine: Up to date Pneumococcal vaccine: Due at age 85 Tdap vaccine: Discuss with pharmacy Shingles vaccine: Discuss with pharmacy   Covid-19: Up to date  Advanced directives: Information mailed today  Conditions/risks identified: See problem list  Next appointment: Follow up in one year for your annual wellness visit 06/27/2022 @ 11:40- (Video visit)  Preventive Care 40-64 Years, Male Preventive care refers to lifestyle choices and visits with your health care provider that can promote health and wellness. What does preventive care include? A yearly physical exam. This is also called an annual well check. Dental exams once or twice a year. Routine eye exams. Ask your health care provider how often you should have your eyes checked. Personal lifestyle choices, including: Daily care of your teeth and gums. Regular physical activity. Eating a healthy diet. Avoiding tobacco and drug use. Limiting alcohol use. Practicing safe sex. Taking low-dose aspirin every day starting at age 81. What happens during an annual well check? The services and screenings done by your health care provider during your annual well check will depend on your age, overall health, lifestyle risk factors, and family history of disease. Counseling  Your health care provider may ask you questions about your: Alcohol use. Tobacco use. Drug use. Emotional well-being. Home and relationship well-being. Sexual activity. Eating habits. Work and  work Statistician. Screening  You may have the following tests or measurements: Height, weight, and BMI. Blood pressure. Lipid and cholesterol levels. These may be checked every 5 years, or more frequently if you are over 42 years old. Skin check. Lung cancer screening. You may have this screening every year starting at age 13 if you have a 30-pack-year history of smoking and currently smoke or have quit within the past 15 years. Fecal occult blood test (FOBT) of the stool. You may have this test every year starting at age 72. Flexible sigmoidoscopy or colonoscopy. You may have a sigmoidoscopy every 5 years or a colonoscopy every 10 years starting at age 69. Prostate cancer screening. Recommendations will vary depending on your family history and other risks. Hepatitis C blood test. Hepatitis B blood test. Sexually transmitted disease (STD) testing. Diabetes screening. This is done by checking your blood sugar (glucose) after you have not eaten for a while (fasting). You may have this done every 1-3 years. Discuss your test results, treatment options, and if necessary, the need for more tests with your health care provider. Vaccines  Your health care provider may recommend certain vaccines, such as: Influenza vaccine. This is recommended every year. Tetanus, diphtheria, and acellular pertussis (Tdap, Td) vaccine. You may need a Td booster every 10 years. Zoster vaccine. You may need this after age 44. Pneumococcal 13-valent conjugate (PCV13) vaccine. You may need this if you have certain conditions and have not been vaccinated. Pneumococcal polysaccharide (PPSV23) vaccine. You may need one or two doses if you smoke cigarettes or if you have certain conditions. Talk to your health care provider about which screenings and vaccines you need and how often you need them. This information is not intended to replace advice given to you  by your health care provider. Make sure you discuss any questions  you have with your health care provider. Document Released: 07/02/2015 Document Revised: 02/23/2016 Document Reviewed: 04/06/2015 Elsevier Interactive Patient Education  2017 [place] Prevention in the Home Falls can cause injuries. They can happen to people of all ages. There are many things you can do to make your home safe and to help prevent falls. What can I do on the outside of my home? Regularly fix the edges of walkways and driveways and fix any cracks. Remove anything that might make you trip as you walk through a door, such as a raised step or threshold. Trim any bushes or trees on the path to your home. Use bright outdoor lighting. Clear any walking paths of anything that might make someone trip, such as rocks or tools. Regularly check to see if handrails are loose or broken. Make sure that both sides of any steps have handrails. Any raised decks and porches should have guardrails on the edges. Have any leaves, snow, or ice cleared regularly. Use sand or salt on walking paths during winter. Clean up any spills in your garage right away. This includes oil or grease spills. What can I do in the bathroom? Use night lights. Install grab bars by the toilet and in the tub and shower. Do not use towel bars as grab bars. Use non-skid mats or decals in the tub or shower. If you need to sit down in the shower, use a plastic, non-slip stool. Keep the floor dry. Clean up any water that spills on the floor as soon as it happens. Remove soap buildup in the tub or shower regularly. Attach bath mats securely with double-sided non-slip rug tape. Do not have throw rugs and other things on the floor that can make you trip. What can I do in the bedroom? Use night lights. Make sure that you have a light by your bed that is easy to reach. Do not use any sheets or blankets that are too big for your bed. They should not hang down onto the floor. Have a firm chair that has side arms. You  can use this for support while you get dressed. Do not have throw rugs and other things on the floor that can make you trip. What can I do in the kitchen? Clean up any spills right away. Avoid walking on wet floors. Keep items that you use a lot in easy-to-reach places. If you need to reach something above you, use a strong step stool that has a grab bar. Keep electrical cords out of the way. Do not use floor polish or wax that makes floors slippery. If you must use wax, use non-skid floor wax. Do not have throw rugs and other things on the floor that can make you trip. What can I do with my stairs? Do not leave any items on the stairs. Make sure that there are handrails on both sides of the stairs and use them. Fix handrails that are broken or loose. Make sure that handrails are as long as the stairways. Check any carpeting to make sure that it is firmly attached to the stairs. Fix any carpet that is loose or worn. Avoid having throw rugs at the top or bottom of the stairs. If you do have throw rugs, attach them to the floor with carpet tape. Make sure that you have a light switch at the top of the stairs and the bottom of the stairs. If  you do not have them, ask someone to add them for you. What else can I do to help prevent falls? Wear shoes that: Do not have high heels. Have rubber bottoms. Are comfortable and fit you well. Are closed at the toe. Do not wear sandals. If you use a stepladder: Make sure that it is fully opened. Do not climb a closed stepladder. Make sure that both sides of the stepladder are locked into place. Ask someone to hold it for you, if possible. Clearly mark and make sure that you can see: Any grab bars or handrails. First and last steps. Where the edge of each step is. Use tools that help you move around (mobility aids) if they are needed. These include: Canes. Walkers. Scooters. Crutches. Turn on the lights when you go into a dark area. Replace any  light bulbs as soon as they burn out. Set up your furniture so you have a clear path. Avoid moving your furniture around. If any of your floors are uneven, fix them. If there are any pets around you, be aware of where they are. Review your medicines with your doctor. Some medicines can make you feel dizzy. This can increase your chance of falling. Ask your doctor what other things that you can do to help prevent falls. This information is not intended to replace advice given to you by your health care provider. Make sure you discuss any questions you have with your health care provider. Document Released: 04/01/2009 Document Revised: 11/11/2015 Document Reviewed: 07/10/2014 Elsevier Interactive Patient Education  2017 [employer].

## 2021-06-21 NOTE — Progress Notes (Addendum)
Subjective:   Bryan Lane. is a 61 y.o. male who presents for an Initial Medicare Annual Wellness Visit.  I connected with  Williamtoday by a video enabled telemedicine application and verified that I am speaking with the correct person using two identifiers.  Location of patient:home Location of provider:Work  Persons participating in the virtual visit: patient, nurse.   I discussed the limitations, risk, security and privacy concerns of evaluation and management by telemedicine. The patient expressed understanding and agreed to proceed.  Some vital signs may be absent or patient reported.   Review of Systems     Cardiac Risk Factors include: advanced age (>21mn, >>38women);male gender;hypertension;dyslipidemia;smoking/ tobacco exposure     Objective:    Today's Vitals   06/21/21 1140  Weight: 206 lb (93.4 kg)  Height: _0  (1.88 m)   Body mass index is 26.45 kg/m.  Advanced Directives 06/21/2021 11/16/2020 09/27/2020 07/07/2019 02/06/2019 02/06/2019 01/31/2018  Does Patient Have a Medical Advance Directive? _1  No No  Would patient like information on creating a medical advance directive? Yes (MAU/Ambulatory/Procedural Areas - Information given) Yes (MAU/Ambulatory/Procedural Areas - Information given) No - Patient declined - No - Patient declined No - Patient declined No - Patient declined    Current Medications (verified) Outpatient Encounter Medications as of 06/21/2021  Medication Sig   atorvastatin (LIPITOR) 10 MG tablet Take 1 tablet (10 mg total) by mouth daily.   baclofen (LIORESAL) 10 MG tablet Take 1 tablet (10 mg total) by mouth 4 (four) times daily. For spasticity   chlorthalidone (HYGROTON) 25 MG tablet Take 1 tablet (25 mg total) by mouth daily.   dantrolene (DANTRIUM) 25 MG capsule Take 1 capsule (25 mg total) by mouth 2 (two) times daily.   diazepam (VALIUM) 5 MG tablet TAKE 1 TABLET BY MOUTH EVERY 8 HOURS AS NEEDED FOR MUSCLE SPASMS    losartan (COZAAR) 25 MG tablet Take 3 tablets (75 mg total) by mouth daily.   methylPREDNISolone (MEDROL DOSEPAK) 4 MG TBPK tablet Take 4 mg by mouth as directed.   silver sulfADIAZINE (SILVADENE) 1 % cream Apply 1 application topically daily.   VITAMIN D PO Take 1 tablet by mouth 3 (three) times a week.   COVID-19 mRNA vaccine, Pfizer, 30 MCG/0.3ML injection  (Patient not taking: Reported on 06/21/2021)   No facility-administered encounter medications on file as of 06/21/2021.    Allergies (verified) Patient has no known allergies.   History: Past Medical History:  Diagnosis Date   Bowel incontinence    due to cervical disc issue   Depression    with pain   Dyspnea    with  exertion and pain   Hyperlipidemia    Hypertension    2 years ago was on med for bp. hctz.   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[facility] 01/31/2018   Urine incontinence 01/2018   due to cervical issue   Past Surgical History:  Procedure Laterality Date   ANTERIOR CERVICAL DECOMPRESSION/DISCECTOMY FUSION 4 LEVELS N/A 01/31/2018   Procedure: ANTERIOR CERVICAL DECOMPRESSION/DISCECTOMY FUSION, INTERBODY PROSTHESIS, PLATE/SCREWS CERVICAL THREE- CERVICAL FOUR, CERVICAL FOUR - CERVICAL FIVE, CERVICAL FIVE - CERVICAL SIX, CERVICAL SIX- CERVICAL SEVEN;  Surgeon:  Newman Pies, MD;  Location: [facility];  Service: Neurosurgery;  Laterality: N/A;  ANTERIOR CERVICAL DECOMPRESSION/DISCECTOMY FUSION, INTERBODY PROSTHESIS, PLATE/SCREWS CER   HAND SURGERY Right    "BB removal"   SHOULDER SURGERY Left 2017   acl repair   Wisdom teeth removal     Family History  Problem Relation Age of Onset   Diabetes Father    Colon polyps Father    Heart attack Mother     Alcoholism Mother    Hypertension Sister    Colon cancer Neg Hx    Esophageal cancer Neg Hx    Rectal cancer Neg Hx    Stomach cancer Neg Hx    Social History   Socioeconomic History   Marital status: Single    Spouse name: Not on file   Number of children: 2   Years of education: 16   Highest education level: Bachelor's degree (e.g., BA, AB, BS)  Occupational History   Occupation: applying for disability  Tobacco Use   Smoking status: Every Day    Types: E-cigarettes   Smokeless tobacco: Never   Tobacco comments:    Occasionally smokes cigarettes  Vaping Use   Vaping Use: Every day  Substance and Sexual Activity   Alcohol use: Yes    Comment: Socially   Drug use: Yes    Types: Marijuana    Comment: last 01/28/18    Sexual activity: Not Currently  Other Topics Concern   Not on file  Social History Narrative   Lives with dad and stepmother in a one story home.  Has 2 children.     Applying for disability.  Education: BS   Social Determinants of Radio broadcast assistant Strain: Low Risk    Difficulty of Paying Living Expenses: Not hard at all  Food Insecurity: No Food Insecurity   Worried About Charity fundraiser in the Last Year: Never true   Arboriculturist in the Last Year: Never true  Transportation Needs: No Transportation Needs   Lack of Transportation (Medical): No   Lack of Transportation (Non-Medical): No  Physical Activity: Insufficiently Active   Days of Exercise per Week: 7 days   Minutes of Exercise per Session: 20 min  Stress: No Stress Concern Present   Feeling of Stress : Not at all  Social Connections: Socially Isolated   Frequency of Communication with Friends and Family: Once a week   Frequency of Social Gatherings with Friends and Family: Once a week   Attends Religious Services: Never   Marine scientist or Organizations: No   Attends Music therapist: Never   Marital Status: Divorced    Tobacco Counseling Ready to  quit: Not Answered Counseling given: Not Answered Tobacco comments: Occasionally smokes cigarettes   Clinical Intake:  Pre-visit preparation completed: Yes  Pain : No/denies pain     BMI - recorded: 26.45 Nutritional Status: BMI 25 -29 Overweight Nutritional Risks: None Diabetes: No  How often do you need to have someone help you when you read instructions, pamphlets, or other written materials from your doctor or pharmacy?: 1 - Never  Diabetic?No  Interpreter Needed?: No  Information entered by :: Caroleen Hamman LPN   Activities of Daily Living In your present state of health, do you have any difficulty performing the following activities: 06/21/2021  Hearing? N  Vision? N  Difficulty concentrating or making decisions? N  Walking or climbing stairs? N  Dressing or bathing? N  Doing errands,  shopping? N  Preparing Food and eating ? N  Using the Toilet? N  In the past six months, have you accidently leaked urine? N  Do you have problems with loss of bowel control? N  Managing your Medications? N  Managing your Finances? N  Housekeeping or managing your Housekeeping? N  Some recent data might be hidden    Patient Care Team: Saguier, Iris Pert as PCP - General (Internal Medicine)  Indicate any recent Medical Services you may have received from other than [facility] providers in the past year (date may be approximate).     Assessment:   This is a routine wellness examination for Gwyndolyn Saxon.  Hearing/Vision screen Hearing Screening - Comments:: No issues Vision Screening - Comments:: Last eye exam-04/2021-[facility]  Dietary issues and exercise activities discussed: Current Exercise Habits: Home exercise routine, Type of exercise: walking, Time (Minutes): 30, Frequency (Times/Week): 7, Weekly Exercise (Minutes/Week): 210, Intensity: Mild, Exercise limited by: orthopedic condition(s)   Goals Addressed             This Visit's Progress    Patient Stated        Increase walking       Depression Screen PHQ 2/9 Scores 06/21/2021 05/04/2021 09/27/2020 04/30/2020 01/28/2020 10/29/2019 08/04/2019  PHQ - 2 Score 1 0 _0 PHQ- 9 Score - - 17 - - - 12    Fall Risk Fall Risk  06/21/2021 05/04/2021 04/30/2020 01/28/2020 10/29/2019  Falls in the past year? 1 0 0 0 0  Number falls in past yr: 1 1 - - -  Injury with Fall? 0 0 - - -  Risk for fall due to : History of fall(s) - - - -  Follow up Falls prevention discussed - - - -    FALL RISK PREVENTION PERTAINING TO THE HOME:  Any stairs in or around the home? Yes  If so, are there any without handrails? No  Home free of loose throw rugs in walkways, pet beds, electrical cords, etc? Yes  Adequate lighting in your home to reduce risk of falls? Yes   ASSISTIVE DEVICES UTILIZED TO PREVENT FALLS:  Life alert? No  Use of a cane, walker or w/c? No  Grab bars in the bathroom? No  Shower chair or bench in shower? No  Elevated toilet seat or a handicapped toilet? No   TIMED UP AND GO:  Was the test performed? No . Phone visit   Cognitive Function:Normal cognitive status assessed by direct observation by this Nurse Health Advisor. No abnormalities found.          Immunizations Immunization History  Administered Date(s) Administered   Hepatitis A, Adult 10/25/1995, 06/26/1996   Hepatitis B, adult 11/24/2008   IPV 05/28/1980   Influenza,inj,Quad PF,6+ Mos 05/02/2019   Influenza-Unspecified 04/19/2021   MMR 04/05/1995   PFIZER(Purple Top)SARS-COV-2 Vaccination 09/09/2019, 09/30/2019, 05/12/2020   Pfizer Covid-19 Vaccine Bivalent Booster 36yr & up 04/19/2021   Smallpox 08/03/2003   Td 08/03/2003   Tdap 11/24/2008   Typhoid Inactivated 08/03/2003    TDAP status: Due, Education has been provided regarding the importance of this vaccine. Advised may receive this vaccine at local pharmacy or Health Dept. Aware to provide a copy of the vaccination record if obtained from local pharmacy or  Health Dept. Verbalized acceptance and understanding.  Flu Vaccine status: Up to date  Pneumococcal vaccine status: Due at age 61 Covid-19 vaccine status: Completed vaccines  Qualifies for Shingles Vaccine?  Yes   Zostavax completed No   Shingrix Completed?: No.    Education has been provided regarding the importance of this vaccine. Patient has been advised to call insurance company to determine out of pocket expense if they have not yet received this vaccine. Advised may also receive vaccine at local pharmacy or Health Dept. Verbalized acceptance and understanding.  Screening Tests Health Maintenance  Topic Date Due   Pneumococcal Vaccine 88-100 Years old (1 - PCV) Never done   HIV Screening  Never done   Hepatitis C Screening  Never done   Zoster Vaccines- Shingrix (1 of 2) Never done   TETANUS/TDAP  11/25/2018   COLONOSCOPY (Pts 45-4yr Insurance coverage will need to be confirmed)  04/17/2021   INFLUENZA VACCINE  Completed   COVID-19 Vaccine  Completed   HPV VACCINES  Aged Out    Health Maintenance  Health Maintenance Due  Topic Date Due   Pneumococcal Vaccine 160649Years old (1 - PCV) Never done   HIV Screening  Never done   Hepatitis C Screening  Never done   Zoster Vaccines- Shingrix (1 of 2) Never done   TETANUS/TDAP  11/25/2018   COLONOSCOPY (Pts 45-459yrInsurance coverage will need to be confirmed)  04/17/2021    Colorectal cancer screening: Due-Declined  Lung Cancer Screening: (Low Dose CT Chest recommended if Age 59-80ears, 30 pack-year currently smoking OR have quit w/in 15years.) does not qualify.     Additional Screening:  Hepatitis C Screening: does qualify; Discuss with PCP at next office visit  Vision Screening: Recommended annual ophthalmology exams for early detection of glaucoma and other disorders of the eye. Is the patient up to date with their annual eye exam?  Yes  Who is the provider or what is the name of the office in which the patient  attends annual eye exams? [facility]  Dental Screening: Recommended annual dental exams for proper oral hygiene  Community Resource Referral / Chronic Care Management: CRR required this visit?  No   CCM required this visit?  No      Plan:     I have personally reviewed and noted the following in the patients chart:   Medical and social history Use of alcohol, tobacco or illicit drugs  Current medications and supplements including opioid prescriptions. Patient is not currently taking opioid prescriptions. Functional ability and status Nutritional status Physical activity Advanced directives List of other physicians Hospitalizations, surgeries, and ER visits in previous 12 months Vitals Screenings to include cognitive, depression, and falls Referrals and appointments  In addition, I have reviewed and discussed with patient certain preventive protocols, quality metrics, and best practice recommendations. A written personalized care plan for preventive services as well as general preventive health recommendations were provided to patient.   Due to this being a telephonic visit, the after visit summary with patients personalized plan was offered to patient via mail or my-chart.  Patient would like to access on my-chart.   MaMarta AntuLPN   1/1/5/8309Nurse Health Advisor  Nurse Notes: None  Review and Agree with assessment & plan of LPN  EdMackie PaiPA-C

## 2021-08-16 ENCOUNTER — Other Ambulatory Visit: Payer: Self-pay | Admitting: Medical

## 2021-08-25 ENCOUNTER — Encounter: Payer: Self-pay | Admitting: Gastroenterology

## 2021-11-29 ENCOUNTER — Encounter: Payer: Self-pay | Admitting: Physical Medicine and Rehabilitation

## 2021-11-30 ENCOUNTER — Ambulatory Visit: Payer: Medicare Other | Admitting: Physical Medicine and Rehabilitation

## 2021-11-30 MED ORDER — DIAZEPAM 5 MG PO TABS: 5.0000 mg | ORAL_TABLET | Freq: Three times a day (TID) | ORAL | 0 refills | Status: DC | PRN

## 2021-11-30 MED ORDER — DIAZEPAM 5 MG PO TABS: 5.0000 mg | ORAL_TABLET | Freq: Three times a day (TID) | ORAL | 5 refills | Status: DC | PRN

## 2021-11-30 NOTE — Addendum Note (Signed)
Addended by: Caro Hight on: 11/30/2021 09:52 AM   Modules accepted: Orders

## 2021-12-29 ENCOUNTER — Other Ambulatory Visit: Payer: Self-pay | Admitting: Medical

## 2021-12-29 ENCOUNTER — Other Ambulatory Visit: Payer: Self-pay | Admitting: Physical Medicine & Rehabilitation

## 2022-01-28 ENCOUNTER — Other Ambulatory Visit: Payer: Self-pay | Admitting: Physical Medicine & Rehabilitation

## 2022-01-28 ENCOUNTER — Other Ambulatory Visit: Payer: Self-pay | Admitting: Medical

## 2022-01-28 ENCOUNTER — Other Ambulatory Visit: Payer: Self-pay | Admitting: Physical Medicine and Rehabilitation

## 2022-02-11 ENCOUNTER — Encounter: Payer: Self-pay | Admitting: Physical Medicine and Rehabilitation

## 2022-02-15 ENCOUNTER — Encounter: Payer: Medicare Other | Admitting: Physical Medicine and Rehabilitation

## 2022-04-03 ENCOUNTER — Encounter: Payer: Self-pay | Admitting: Medical

## 2022-05-04 ENCOUNTER — Other Ambulatory Visit: Payer: Self-pay | Admitting: Physical Medicine and Rehabilitation

## 2022-05-10 ENCOUNTER — Encounter
Payer: Medicare Other | Attending: Physical Medicine and Rehabilitation | Admitting: Physical Medicine and Rehabilitation

## 2022-05-10 ENCOUNTER — Encounter: Payer: Self-pay | Admitting: Physical Medicine and Rehabilitation

## 2022-05-10 VITALS — BP 119/81 | HR 80 | Ht 72.0 in | Wt 207.8 lb

## 2022-05-10 DIAGNOSIS — G992 Myelopathy in diseases classified elsewhere: Secondary | ICD-10-CM | POA: Insufficient documentation

## 2022-05-10 DIAGNOSIS — M62838 Other muscle spasm: Secondary | ICD-10-CM | POA: Diagnosis present

## 2022-05-10 DIAGNOSIS — M4802 Spinal stenosis, cervical region: Secondary | ICD-10-CM | POA: Diagnosis present

## 2022-05-10 MED ORDER — DANTROLENE SODIUM 50 MG PO CAPS: 50.0000 mg | ORAL_CAPSULE | Freq: Two times a day (BID) | ORAL | 1 refills | Status: DC

## 2022-05-10 MED ORDER — BACLOFEN 10 MG PO TABS: 10.0000 mg | ORAL_TABLET | Freq: Four times a day (QID) | ORAL | 1 refills | Status: DC

## 2022-05-10 MED ORDER — DIAZEPAM 5 MG PO TABS: 5.0000 mg | ORAL_TABLET | Freq: Three times a day (TID) | ORAL | 5 refills | Status: DC | PRN

## 2022-05-10 NOTE — Progress Notes (Signed)
Subjective:    Patient ID: Bryan Lane., male    DOB: 1960/08/29, 61 y.o.   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  Here for spasticity f/u as well as incoordination and parasthesia  and f/u after L3/L4 fusion. In summer 2022   On Dantrolene 25 mg BID Can walk 14 mile, then everything gets tight and cannot work further Does that 4-5x/day so can get some exercise.   Is interested in going up on Dantrolene.    Takes Baclofen -taking 10 mg 3x/day- modified it trying to get improvement in spasticity.    Cannot sleep well because cramps up at night.  Dad has cancer- difficult to get up to see me- esp if needs to get in 6 months, so will be trying to get a new Rehab doctor in [place].   Has bladder urgency still, but bowel urgency is better. Both better, but bladder still an issue  Pain Inventory Average Pain 5 Pain Right Now 5 My pain is dull, tingling, and aching  In the last 24 hours, has pain interfered with the following? General activity 5 Relation with others 5 Enjoyment of life 8 What TIME of day is your pain at its worst? morning  Sleep (in general) NA  Pain is worse with: walking, inactivity, and standing Pain improves with: therapy/exercise, pacing activities, and medication Relief from Meds: 5  Family History  Problem Relation Age of Onset   Diabetes Father    Colon polyps Father    Heart attack Mother    Alcoholism Mother    Hypertension Sister    Colon cancer Neg Hx    Esophageal cancer Neg Hx    Rectal cancer Neg Hx    Stomach cancer Neg Hx    Social History   Socioeconomic History   Marital status: Single    Spouse name: Not on file   Number of children: 2   Years of education: 16   Highest education level: Bachelor's degree (e.g., BA, AB, BS)  Occupational History   Occupation: applying for disability   Tobacco Use   Smoking status: Every Day    Types: E-cigarettes   Smokeless tobacco: Never   Tobacco comments:    Occasionally smokes cigarettes  Vaping Use   Vaping Use: Every day  Substance and Sexual Activity   Alcohol use: Yes    Comment: Socially   Drug use: Yes    Types: Marijuana    Comment: last 01/28/18    Sexual activity: Not Currently  Other Topics Concern   Not on file  Social History Narrative   Lives with dad and stepmother in a one story home.  Has 2 children.     Applying for disability.  Education: BS   Social Determinants of Health   Financial Resource Strain: Low Risk  (06/21/2021)   Overall Financial Resource Strain (CARDIA)    Difficulty of Paying Living Expenses: Not hard at all  Food Insecurity: No Food Insecurity (06/21/2021)   Hunger Vital Sign    Worried About Running Out of Food in the Last Year: Never true    Ran Out of Food in the Last Year: Never true  Transportation Needs: No Transportation Needs (06/21/2021)   PRAPARE - Hydrologist (Medical): No    Lack of Transportation (Non-Medical): No  Physical Activity: Insufficiently  Active (06/21/2021)   Exercise Vital Sign    Days of Exercise per Week: 7 days    Minutes of Exercise per Session: 20 min  Stress: No Stress Concern Present (06/21/2021)   [name]    Feeling of Stress : Not at all  Social Connections: Socially Isolated (06/21/2021)   Social Connection and Isolation Panel [NHANES]    Frequency of Communication with Friends and Family: Once a week    Frequency of Social Gatherings with Friends and Family: Once a week    Attends Religious Services: Never    Marine scientist or Organizations: No    Attends Music therapist: Never    Marital Status: Divorced   Past Surgical History:  Procedure Laterality Date   ANTERIOR CERVICAL DECOMPRESSION/DISCECTOMY FUSION 4 LEVELS N/A 01/31/2018    Procedure: ANTERIOR CERVICAL DECOMPRESSION/DISCECTOMY FUSION, INTERBODY PROSTHESIS, PLATE/SCREWS CERVICAL THREE- CERVICAL FOUR, CERVICAL FOUR - CERVICAL FIVE, CERVICAL FIVE - CERVICAL SIX, CERVICAL SIX- CERVICAL SEVEN;  Surgeon: Newman Pies, MD;  Location: [facility];  Service: Neurosurgery;  Laterality: N/A;  ANTERIOR CERVICAL DECOMPRESSION/DISCECTOMY FUSION, INTERBODY PROSTHESIS, PLATE/SCREWS CER   HAND SURGERY Right    "BB removal"   SHOULDER SURGERY Left 2017   acl repair   Wisdom teeth removal     Past Surgical History:  Procedure Laterality Date   ANTERIOR CERVICAL DECOMPRESSION/DISCECTOMY FUSION 4 LEVELS N/A 01/31/2018   Procedure: ANTERIOR CERVICAL DECOMPRESSION/DISCECTOMY FUSION, INTERBODY PROSTHESIS, PLATE/SCREWS CERVICAL THREE- CERVICAL FOUR, CERVICAL FOUR - CERVICAL FIVE, CERVICAL FIVE - CERVICAL SIX, CERVICAL SIX- CERVICAL SEVEN;  Surgeon: Newman Pies, MD;  Location: [facility];  Service: Neurosurgery;  Laterality: N/A;  ANTERIOR CERVICAL DECOMPRESSION/DISCECTOMY FUSION, INTERBODY PROSTHESIS, PLATE/SCREWS CER   HAND SURGERY Right    "BB removal"   SHOULDER SURGERY Left 2017   acl repair   Wisdom teeth removal     Past Medical History:  Diagnosis Date   Bowel incontinence    due to cervical disc issue   Depression    with pain   Dyspnea    with  exertion and pain   Hyperlipidemia    Hypertension    2 years ago was on med for bp. hctz.   Muscle cramping 01/14/2018   Muscle spasticity    Neuromuscular disorder ([place])    Spinal cord injury   Paresthesia 01/14/2018   PTSD (post-traumatic stress disorder)    Spastic gait 01/14/2018   Spasticity 08/04/2019   Spondylolisthesis of lumbar region 02/06/2019   Stenosis of cervical spine with myelopathy ([place]) 01/31/2018   Urine incontinence 01/2018   due to cervical issue   BP 119/81   Pulse 80   Ht 6' (1.829 m)   Wt 207 lb 12.8 oz (94.3 kg)   SpO2 98%   BMI 28.18 kg/m   Opioid Risk Score:   Fall Risk Score:   `1  Depression screen [facility] 2/9     05/10/2022    9:17 AM 06/21/2021   11:53 AM 05/04/2021   10:52 AM 09/27/2020    9:31 AM 04/30/2020   11:49 AM 01/28/2020   11:10 AM 10/29/2019   11:39 AM  Depression screen PHQ 2/9  Decreased Interest 3 0 0 '3 3 1 2  '$ Down, Depressed, Hopeless 3 1 0 '2 3 1 2  '$ PHQ - 2 Score 6 1 0 '5 6 2 4  '$ Altered sleeping    1     Tired, decreased energy    3  Change in appetite    2     Feeling bad or failure about yourself     3     Trouble concentrating    1     Moving slowly or fidgety/restless    1     Suicidal thoughts    1     PHQ-9 Score    17     Difficult doing work/chores    Somewhat difficult         Review of Systems  Constitutional: Negative.   HENT: Negative.    Eyes: Negative.   Respiratory: Negative.    Cardiovascular: Negative.   Gastrointestinal: Negative.   Endocrine: Negative.   Genitourinary: Negative.   Musculoskeletal:        Bil knee pain  Skin: Negative.   Allergic/Immunologic: Negative.   Neurological: Negative.   Hematological: Negative.   Psychiatric/Behavioral:  Positive for dysphoric mood.   All other systems reviewed and are negative.      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  Here for spasticity f/u as well as incoordination and parasthesia  and f/u after L3/L4 fusion. In summer 2022   Will get PCP to draw CMP next week- already has appointment.   2. Will increase Baclofen  to 10 mg 3x/day and can have 1 extra pill daily as needed- will write for 3 months with 1 refill   3. Will refill Valium 5 mg  3x/day #90 with 5 refills  4. Will increase Dantrolene to 50 mg 2x/day #180- 1 refills   5. F/U in 6 months-    I spent a total of  23  minutes on total care today- >50% coordination of care- due to discussion on how to get doctor in [place].

## 2022-05-10 NOTE — Patient Instructions (Signed)
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  Here for spasticity f/u as well as incoordination and parasthesia  and f/u after L3/L4 fusion. In summer 2022   Will get PCP to draw CMP next week- already has appointment.   2. Will increase Baclofen  to 10 mg 3x/day and can have 1 extra pill daily as needed- will write for 3 months with 1 refill   3. Will refill Valium 5 mg 3x/day #90 with 5 refills  4. Will increase Dantrolene to 50 mg 2x/day #180- 1 refills   5. F/U in 6 months-

## 2022-05-12 ENCOUNTER — Encounter: Payer: Self-pay | Admitting: Physical Medicine and Rehabilitation

## 2022-05-15 ENCOUNTER — Ambulatory Visit (INDEPENDENT_AMBULATORY_CARE_PROVIDER_SITE_OTHER): Payer: Medicare Other | Admitting: Medical

## 2022-05-15 ENCOUNTER — Encounter: Payer: Self-pay | Admitting: Medical

## 2022-05-15 VITALS — BP 117/80 | HR 86 | Resp 18 | Ht 72.0 in | Wt 201.0 lb

## 2022-05-15 DIAGNOSIS — I10 Essential (primary) hypertension: Secondary | ICD-10-CM | POA: Diagnosis not present

## 2022-05-15 DIAGNOSIS — E785 Hyperlipidemia, unspecified: Secondary | ICD-10-CM | POA: Diagnosis not present

## 2022-05-15 DIAGNOSIS — R739 Hyperglycemia, unspecified: Secondary | ICD-10-CM

## 2022-05-15 LAB — LIPID PANEL
Cholesterol: 150 mg/dL (ref 0–200)
HDL: 43.3 mg/dL (ref >39.00)
LDL Cholesterol: 83 mg/dL (ref 0–99)
NonHDL: 107.02
Total CHOL/HDL Ratio: 3
Triglycerides: 118 mg/dL (ref 0.0–149.0)
VLDL: 23.6 mg/dL (ref 0.0–40.0)

## 2022-05-15 LAB — COMPREHENSIVE METABOLIC PANEL
ALT: 22 U/L (ref 0–53)
AST: 22 U/L (ref 0–37)
Albumin: 5.2 g/dL (ref 3.5–5.2)
Alkaline Phosphatase: 54 U/L (ref 39–117)
BUN: 24 mg/dL — ABNORMAL HIGH (ref 6–23)
CO2: 29 mEq/L (ref 19–32)
Calcium: 10.6 mg/dL — ABNORMAL HIGH (ref 8.4–10.5)
Chloride: 97 mEq/L (ref 96–112)
Creatinine, Ser: 1.11 mg/dL (ref 0.40–1.50)
GFR: 72.79 mL/min (ref >60.00)
Glucose, Bld: 91 mg/dL (ref 70–99)
Potassium: 4 mEq/L (ref 3.5–5.1)
Sodium: 135 mEq/L (ref 135–145)
Total Bilirubin: 0.5 mg/dL (ref 0.2–1.2)
Total Protein: 7.6 g/dL (ref 6.0–8.3)

## 2022-05-15 LAB — HEMOGLOBIN A1C: Hgb A1c MFr Bld: 6.4 % (ref 4.6–6.5)

## 2022-05-15 NOTE — Patient Instructions (Addendum)
Hypertension and your blood pressure is well controlled today.  Continue losartan.   Elevated sugar in the prediabetic range.  Will get A1c today.   Hyperlipidemia, coronary artery disease and mild liver enzyme elevation.  Continue atorvastatin and repeat metabolic panel today along with lipid panel.   History of chronic low back pain.  Some improved with surgery but then recent flare of pain after lifting cinderblock.  Follow-up with your neurosurgeon.  Follow their advice regarding recent flare of back pain.   Depression. Declined any meds. If mood worsens or changes let us know. Be seen by psychiatrist in [place].  Follow up on as needed basis.

## 2022-05-15 NOTE — Progress Notes (Signed)
Subjective:    Patient ID: Bryan Lane., male    DOB: [birth year], 61 y.o.   MRN: [id number]  HPI History of hypertension and your blood pressure is well controlled today. Continue losartan.   History of elevated sugar in the prediabetic range.  Will get A1c today.   History of hyperlipidemia, coronary artery disease and mild liver enzyme elevation.  Continue atorvastatin and repeat metabolic panel today along with lipid panel.   History of chronic low back pain.  Some improved with surgery but then recent flare of pain after lifting cinderblock.  Follow-up with your neurosurgeon.  Follow their advice regarding recent flare of back pain.   History of depression and self diagnosis of borderline personality disorder.  In the past declined referral to psychiatry and medication.  You do have a high PHQ-9 score.  No active thoughts of harm to self or others.  If any of these were developed then recommend ED evaluation.  We discussed answer 9 on PHQ-9 score today.     Pt  did smoke from 61 yo to 81 year old. At least a pack a day.  Pt in past had CT lungs done. He will have it done in [place].   IMPRESSION: 1. Lung-RADS 2, benign appearance or behavior. Continue annual screening with low-dose chest CT without contrast in 12 months. 2. Emphysema and aortic atherosclerosis. 3. Coronary artery calcifications.  Pt lives in [place] and he is going to establish primary care in [place].  Today he declines declines filling out depression and anxiety questioneer. Declines any meds.    Review of Systems  Constitutional:  Negative for chills, fatigue and fever.  Respiratory:  Negative for cough, chest tightness and wheezing.   Cardiovascular:  Negative for chest pain and palpitations.  Gastrointestinal:  Negative for abdominal pain, constipation, diarrhea and nausea.  Genitourinary:  Negative for dysuria, flank pain and hematuria.  Musculoskeletal:  Negative for back pain, joint  swelling, myalgias and neck stiffness.  Skin:  Negative for rash.  Neurological:  Negative for dizziness, syncope, weakness, numbness and headaches.  Psychiatric/Behavioral:  Negative for behavioral problems and confusion.     Past Medical History:  Diagnosis Date   Bowel incontinence    due to cervical disc issue   Depression    with pain   Dyspnea    with  exertion and pain   Hyperlipidemia    Hypertension    2 years ago was on med for bp. hctz.   Muscle cramping 01/14/2018   Muscle spasticity    Neuromuscular disorder ([place])    Spinal cord injury   Paresthesia 01/14/2018   PTSD (post-traumatic stress disorder)    Spastic gait 01/14/2018   Spasticity 08/04/2019   Spondylolisthesis of lumbar region 02/06/2019   Stenosis of cervical spine with myelopathy ([place]) 01/31/2018   Urine incontinence 01/2018   due to cervical issue     Social History   Socioeconomic History   Marital status: Single    Spouse name: Not on file   Number of children: 2   Years of education: 16   Highest education level: Bachelor's degree (e.g., BA, AB, BS)  Occupational History   Occupation: applying for disability  Tobacco Use   Smoking status: Every Day    Types: E-cigarettes   Smokeless tobacco: Never   Tobacco comments:    Occasionally smokes cigarettes  Vaping Use   Vaping Use: Every day  Substance and Sexual Activity   Alcohol use: Yes  Comment: Socially   Drug use: Yes    Types: Marijuana    Comment: last 01/28/18    Sexual activity: Not Currently  Other Topics Concern   Not on file  Social History Narrative   Lives with dad and stepmother in a one story home.  Has 2 children.     Applying for disability.  Education: BS   Social Determinants of Health   Financial Resource Strain: Low Risk  (06/21/2021)   Overall Financial Resource Strain (CARDIA)    Difficulty of Paying Living Expenses: Not hard at all  Food Insecurity: No Food Insecurity (06/21/2021)   Hunger Vital Sign     Worried About Running Out of Food in the Last Year: Never true    Ran Out of Food in the Last Year: Never true  Transportation Needs: No Transportation Needs (06/21/2021)   PRAPARE - Hydrologist (Medical): No    Lack of Transportation (Non-Medical): No  Physical Activity: Insufficiently Active (06/21/2021)   Exercise Vital Sign    Days of Exercise per Week: 7 days    Minutes of Exercise per Session: 20 min  Stress: No Stress Concern Present (06/21/2021)   [place]    Feeling of Stress : Not at all  Social Connections: Socially Isolated (06/21/2021)   Social Connection and Isolation Panel [NHANES]    Frequency of Communication with Friends and Family: Once a week    Frequency of Social Gatherings with Friends and Family: Once a week    Attends Religious Services: Never    Marine scientist or Organizations: No    Attends Archivist Meetings: Never    Marital Status: Divorced  Human resources officer Violence: Not At Risk (06/21/2021)   Humiliation, Afraid, Rape, and Kick questionnaire    Fear of Current or Ex-Partner: No    Emotionally Abused: No    Physically Abused: No    Sexually Abused: No    Past Surgical History:  Procedure Laterality Date   ANTERIOR CERVICAL DECOMPRESSION/DISCECTOMY FUSION 4 LEVELS N/A 01/31/2018   Procedure: ANTERIOR CERVICAL DECOMPRESSION/DISCECTOMY FUSION, INTERBODY PROSTHESIS, PLATE/SCREWS CERVICAL THREE- CERVICAL FOUR, CERVICAL FOUR - CERVICAL FIVE, CERVICAL FIVE - CERVICAL SIX, CERVICAL SIX- CERVICAL SEVEN;  Surgeon: Newman Pies, MD;  Location: [place] [facility];  Service: Neurosurgery;  Laterality: N/A;  ANTERIOR CERVICAL DECOMPRESSION/DISCECTOMY FUSION, INTERBODY PROSTHESIS, PLATE/SCREWS CER   HAND SURGERY Right    "BB removal"   SHOULDER SURGERY Left 2017   acl repair   Wisdom teeth removal      Family History  Problem Relation Age of Onset   Diabetes Father     Colon polyps Father    Heart attack Mother    Alcoholism Mother    Hypertension Sister    Colon cancer Neg Hx    Esophageal cancer Neg Hx    Rectal cancer Neg Hx    Stomach cancer Neg Hx     No Known Allergies  Current Outpatient Medications on File Prior to Visit  Medication Sig Dispense Refill   atorvastatin (LIPITOR) 10 MG tablet TAKE 1 TABLET BY MOUTH EVERY DAY 90 tablet 3   baclofen (LIORESAL) 10 MG tablet Take 1 tablet (10 mg total) by mouth 4 (four) times daily. 360 tablet 1   chlorthalidone (HYGROTON) 25 MG tablet TAKE 1 TABLET BY MOUTH EVERY DAY 90 tablet 3   COVID-19 mRNA vaccine, Pfizer, 30 MCG/0.3ML injection      dantrolene (DANTRIUM)  50 MG capsule Take 1 capsule (50 mg total) by mouth 2 (two) times daily. 180 capsule 1   diazepam (VALIUM) 5 MG tablet Take 1 tablet (5 mg total) by mouth every 8 (eight) hours as needed for muscle spasms. 90 tablet 5   Dupilumab (DUPIXENT) 300 MG/2ML SOPN Inject into the skin.     losartan (COZAAR) 25 MG tablet TAKE 3 TABLETS BY MOUTH EVERY DAY 90 tablet 3   VITAMIN D PO Take 1 tablet by mouth 3 (three) times a week.     No current facility-administered medications on file prior to visit.    BP 117/80   Pulse 86   Resp 18   Ht 6' (1.829 m)   Wt 201 lb (91.2 kg)   SpO2 98%   BMI 27.26 kg/m        Objective:   Physical Exam  General Mental Status- Alert. General Appearance- Not in acute distress.   Skin General: Color- Normal Color. Moisture- Normal Moisture.  Neck Carotid Arteries- Normal color. Moisture- Normal Moisture. No carotid bruits. No JVD.  Chest and Lung Exam Auscultation: Breath Sounds:-Normal.  Cardiovascular Auscultation:Rythm- Regular. Murmurs & Other Heart Sounds:Auscultation of the heart reveals- No Murmurs.  Abdomen Inspection:-Inspeection Normal. Palpation/Percussion:Note:No mass. Palpation and Percussion of the abdomen reveal- Non Tender, Non Distended + BS, no rebound or  guarding.   Neurologic Cranial Nerve exam:- CN III-XII intact(No nystagmus), symmetric smile. Strength:- 5/5 equal and symmetric strength both upper and lower extremities.       Assessment & Plan:   Patient Instructions  Hypertension and your blood pressure is well controlled today.  Continue losartan.   Elevated sugar in the prediabetic range.  Will get A1c today.   Hyperlipidemia, coronary artery disease and mild liver enzyme elevation.  Continue atorvastatin and repeat metabolic panel today along with lipid panel.   History of chronic low back pain.  Some improved with surgery but then recent flare of pain after lifting cinderblock.  Follow-up with your neurosurgeon.  Follow their advice regarding recent flare of back pain.   Depression. Declined any meds. If mood worsens or changes let us know. Be seen by psychiatrist in [place].  Follow up on as needed basis.     Mackie Pai, PA-C

## 2022-05-15 NOTE — Addendum Note (Signed)
Addended by: Anabel Halon on: 05/15/2022 05:13 PM   Modules accepted: Orders

## 2022-05-18 ENCOUNTER — Other Ambulatory Visit (HOSPITAL_BASED_OUTPATIENT_CLINIC_OR_DEPARTMENT_OTHER): Payer: Self-pay

## 2022-05-18 ENCOUNTER — Telehealth: Payer: Self-pay | Admitting: Medical

## 2022-05-18 MED ORDER — COVID-19 MRNA 2023-2024 VACCINE (COMIRNATY) 0.3 ML INJECTION
0.3000 mL | Freq: Once | INTRAMUSCULAR | 0 refills | Status: AC
  Filled 2022-05-18: qty 0.3, 1d supply, fill #0

## 2022-05-18 NOTE — Telephone Encounter (Signed)
Documents placed to scan

## 2022-05-18 NOTE — Telephone Encounter (Signed)
Pt dropped off copy of his vaccine done at the pharmacy, pt would like provider to see it and have it on pt's chart. Document put at front office tray under providers name.

## 2022-05-24 ENCOUNTER — Ambulatory Visit: Payer: Medicare Other | Admitting: Physical Medicine and Rehabilitation

## 2022-06-27 ENCOUNTER — Ambulatory Visit: Payer: Medicare Other

## 2022-06-27 ENCOUNTER — Ambulatory Visit (INDEPENDENT_AMBULATORY_CARE_PROVIDER_SITE_OTHER): Payer: Medicare Other | Admitting: *Deleted

## 2022-06-27 DIAGNOSIS — Z0000 Encounter for general adult medical examination without abnormal findings: Secondary | ICD-10-CM

## 2022-06-27 NOTE — Patient Instructions (Signed)
Mr. Bryan Lane , Thank you for taking time to come for your Medicare Wellness Visit. I appreciate your ongoing commitment to your health goals. Please review the following plan we discussed and let me know if I can assist you in the future.   These are the goals we discussed:  Goals      Patient Stated     Increase walking        This is a list of the screening recommended for you and due dates:  Health Maintenance  Topic Date Due   HIV Screening  Never done   Hepatitis C Screening: USPSTF Recommendation to screen - Ages 72-79 yo.  Never done   Zoster (Shingles) Vaccine (1 of 2) Never done   DTaP/Tdap/Td vaccine (3 - Td or Tdap) 11/25/2018   Colon Cancer Screening  04/17/2021   Medicare Annual Wellness Visit  06/28/2023   Flu Shot  Completed   COVID-19 Vaccine  Completed   HPV Vaccine  Aged Out     Next appointment: Follow up in one year for your annual wellness visit.  Preventive Care 40-64 Years, Male Preventive care refers to lifestyle choices and visits with your health care provider that can promote health and wellness. What does preventive care include? A yearly physical exam. This is also called an annual well check. Dental exams once or twice a year. Routine eye exams. Ask your health care provider how often you should have your eyes checked. Personal lifestyle choices, including: Daily care of your teeth and gums. Regular physical activity. Eating a healthy diet. Avoiding tobacco and drug use. Limiting alcohol use. Practicing safe sex. Taking low-dose aspirin every day starting at age 25. What happens during an annual well check? The services and screenings done by your health care provider during your annual well check will depend on your age, overall health, lifestyle risk factors, and family history of disease. Counseling  Your health care provider may ask you questions about your: Alcohol use. Tobacco use. Drug use. Emotional well-being. Home and  relationship well-being. Sexual activity. Eating habits. Work and work Statistician. Screening  You may have the following tests or measurements: Height, weight, and BMI. Blood pressure. Lipid and cholesterol levels. These may be checked every 5 years, or more frequently if you are over 45 years old. Skin check. Lung cancer screening. You may have this screening every year starting at age 59 if you have a 30-pack-year history of smoking and currently smoke or have quit within the past 15 years. Fecal occult blood test (FOBT) of the stool. You may have this test every year starting at age 72. Flexible sigmoidoscopy or colonoscopy. You may have a sigmoidoscopy every 5 years or a colonoscopy every 10 years starting at age 76. Prostate cancer screening. Recommendations will vary depending on your family history and other risks. Hepatitis C blood test. Hepatitis B blood test. Sexually transmitted disease (STD) testing. Diabetes screening. This is done by checking your blood sugar (glucose) after you have not eaten for a while (fasting). You may have this done every 1-3 years. Discuss your test results, treatment options, and if necessary, the need for more tests with your health care provider. Vaccines  Your health care provider may recommend certain vaccines, such as: Influenza vaccine. This is recommended every year. Tetanus, diphtheria, and acellular pertussis (Tdap, Td) vaccine. You may need a Td booster every 10 years. Zoster vaccine. You may need this after age 82. Pneumococcal 13-valent conjugate (PCV13) vaccine. You may need this  if you have certain conditions and have not been vaccinated. Pneumococcal polysaccharide (PPSV23) vaccine. You may need one or two doses if you smoke cigarettes or if you have certain conditions. Talk to your health care provider about which screenings and vaccines you need and how often you need them. This information is not intended to replace advice given to  you by your health care provider. Make sure you discuss any questions you have with your health care provider. Document Released: 07/02/2015 Document Revised: 02/23/2016 Document Reviewed: 04/06/2015 Elsevier Interactive Patient Education  2017 [place] Prevention in the Home Falls can cause injuries. They can happen to people of all ages. There are many things you can do to make your home safe and to help prevent falls. What can I do on the outside of my home? Regularly fix the edges of walkways and driveways and fix any cracks. Remove anything that might make you trip as you walk through a door, such as a raised step or threshold. Trim any bushes or trees on the path to your home. Use bright outdoor lighting. Clear any walking paths of anything that might make someone trip, such as rocks or tools. Regularly check to see if handrails are loose or broken. Make sure that both sides of any steps have handrails. Any raised decks and porches should have guardrails on the edges. Have any leaves, snow, or ice cleared regularly. Use sand or salt on walking paths during winter. Clean up any spills in your garage right away. This includes oil or grease spills. What can I do in the bathroom? Use night lights. Install grab bars by the toilet and in the tub and shower. Do not use towel bars as grab bars. Use non-skid mats or decals in the tub or shower. If you need to sit down in the shower, use a plastic, non-slip stool. Keep the floor dry. Clean up any water that spills on the floor as soon as it happens. Remove soap buildup in the tub or shower regularly. Attach bath mats securely with double-sided non-slip rug tape. Do not have throw rugs and other things on the floor that can make you trip. What can I do in the bedroom? Use night lights. Make sure that you have a light by your bed that is easy to reach. Do not use any sheets or blankets that are too big for your bed. They should not  hang down onto the floor. Have a firm chair that has side arms. You can use this for support while you get dressed. Do not have throw rugs and other things on the floor that can make you trip. What can I do in the kitchen? Clean up any spills right away. Avoid walking on wet floors. Keep items that you use a lot in easy-to-reach places. If you need to reach something above you, use a strong step stool that has a grab bar. Keep electrical cords out of the way. Do not use floor polish or wax that makes floors slippery. If you must use wax, use non-skid floor wax. Do not have throw rugs and other things on the floor that can make you trip. What can I do with my stairs? Do not leave any items on the stairs. Make sure that there are handrails on both sides of the stairs and use them. Fix handrails that are broken or loose. Make sure that handrails are as long as the stairways. Check any carpeting to make sure that it  is firmly attached to the stairs. Fix any carpet that is loose or worn. Avoid having throw rugs at the top or bottom of the stairs. If you do have throw rugs, attach them to the floor with carpet tape. Make sure that you have a light switch at the top of the stairs and the bottom of the stairs. If you do not have them, ask someone to add them for you. What else can I do to help prevent falls? Wear shoes that: Do not have high heels. Have rubber bottoms. Are comfortable and fit you well. Are closed at the toe. Do not wear sandals. If you use a stepladder: Make sure that it is fully opened. Do not climb a closed stepladder. Make sure that both sides of the stepladder are locked into place. Ask someone to hold it for you, if possible. Clearly mark and make sure that you can see: Any grab bars or handrails. First and last steps. Where the edge of each step is. Use tools that help you move around (mobility aids) if they are needed. These  include: Canes. Walkers. Scooters. Crutches. Turn on the lights when you go into a dark area. Replace any light bulbs as soon as they burn out. Set up your furniture so you have a clear path. Avoid moving your furniture around. If any of your floors are uneven, fix them. If there are any pets around you, be aware of where they are. Review your medicines with your doctor. Some medicines can make you feel dizzy. This can increase your chance of falling. Ask your doctor what other things that you can do to help prevent falls. This information is not intended to replace advice given to you by your health care provider. Make sure you discuss any questions you have with your health care provider. Document Released: 04/01/2009 Document Revised: 11/11/2015 Document Reviewed: 07/10/2014 Elsevier Interactive Patient Education  2017 [employer].

## 2022-06-27 NOTE — Progress Notes (Addendum)
Subjective:   Bryan Lane. is a 62 y.o. male who presents for Medicare Annual/Subsequent preventive examination.  I connected with  [name]. on 06/27/22 by an audio enabled telemedicine application and verified that I am speaking with the correct person using two identifiers.  Patient Location: Home  Provider Location: Office/Clinic  I discussed the limitations of evaluation and management by telemedicine. The patient expressed understanding and agreed to proceed.   Review of Systems    Defer to PCP Cardiac Risk Factors include: male gender;advanced age (>11mn, >>66women);dyslipidemia;hypertension     Objective:    There were no vitals filed for this visit. There is no height or weight on file to calculate BMI.     06/27/2022    9:06 AM 06/21/2021   11:45 AM 11/16/2020    8:57 AM 09/27/2020   12:22 PM 07/07/2019    2:43 PM 02/06/2019    4:20 PM 02/06/2019    9:39 AM  Advanced Directives  Does Patient Have a Medical Advance Directive? Yes No No No No No No  Type of Advance Directive Living will        Would patient like information on creating a medical advance directive? No - Patient declined Yes (MAU/Ambulatory/Procedural Areas - Information given) Yes (MAU/Ambulatory/Procedural Areas - Information given) No - Patient declined  No - Patient declined No - Patient declined    Current Medications (verified) Outpatient Encounter Medications as of 06/27/2022  Medication Sig   atorvastatin (LIPITOR) 10 MG tablet TAKE 1 TABLET BY MOUTH EVERY DAY   baclofen (LIORESAL) 10 MG tablet Take 1 tablet (10 mg total) by mouth 4 (four) times daily.   chlorthalidone (HYGROTON) 25 MG tablet TAKE 1 TABLET BY MOUTH EVERY DAY   COVID-19 mRNA vaccine, Pfizer, 30 MCG/0.3ML injection    dantrolene (DANTRIUM) 50 MG capsule Take 1 capsule (50 mg total) by mouth 2 (two) times daily.   diazepam (VALIUM) 5 MG tablet Take 1 tablet (5 mg total) by mouth every 8 (eight) hours as needed for  muscle spasms.   Dupilumab (DUPIXENT) 300 MG/2ML SOPN Inject into the skin.   losartan (COZAAR) 25 MG tablet TAKE 3 TABLETS BY MOUTH EVERY DAY   VITAMIN D PO Take 1 tablet by mouth 3 (three) times a week.   No facility-administered encounter medications on file as of 06/27/2022.    Allergies (verified) Patient has no known allergies.   History: Past Medical History:  Diagnosis Date   Bowel incontinence    due to cervical disc issue   Depression    with pain   Dyspnea    with  exertion and pain   Hyperlipidemia    Hypertension    2 years ago was on med for bp. hctz.   Muscle cramping 01/14/2018   Muscle spasticity    Neuromuscular disorder (HCC)    Spinal cord injury   Paresthesia 01/14/2018   PTSD (post-traumatic stress disorder)    Spastic gait 01/14/2018   Spasticity 08/04/2019   Spondylolisthesis of lumbar region 02/06/2019   [place] spine [place] myel[place] 01/31/2018   Urine incontinence 01/2018   due to cervical issue   Past Surgical History:  Procedure Laterality Date   ANTERIOR CERVICAL DECOMPRESSION/DISCECTOMY FUSION 4 LEVELS N/A 01/31/2018   Procedure: ANTERIOR CERVICAL DECOMPRESSION/DISCECTOMY FUSION, INTERBODY PROSTHESIS, PLATE/SCREWS CERVICAL THREE- CERVICAL FOUR, CERVICAL FOUR - CERVICAL FIVE, CERVICAL FIVE - CERVICAL SIX, CERVICAL SIX- CERVICAL SEVEN;  Surgeon: JNewman Pies MD;  [facility];  Service: Neurosurgery;  Laterality: N/A;  ANTERIOR CERVICAL DECOMPRESSION/DISCECTOMY FUSION, INTERBODY PROSTHESIS, PLATE/SCREWS CER   HAND SURGERY Right    "BB removal"   SHOULDER SURGERY Left 2017   acl repair   Wisdom teeth removal     Family History  Problem Relation Age of Onset   Diabetes Father    Colon polyps Father    Heart attack Mother    Alcoholism Mother    Hypertension Sister    Colon cancer Neg Hx    Esophageal cancer Neg Hx    Rectal cancer Neg Hx    Stomach cancer Neg Hx    Social History   Socioeconomic History   Marital  status: Single    Spouse name: Not on file   Number of children: 2   Years of education: 16   Highest education level: Bachelor's degree (e.g., BA, AB, BS)  Occupational History   Occupation: applying for disability  Tobacco Use   Smoking status: Every Day    Types: E-cigarettes   Smokeless tobacco: Never   Tobacco comments:    Occasionally smokes cigarettes  Vaping Use   Vaping Use: Every day  Substance and Sexual Activity   Alcohol use: Yes    Comment: Socially   Drug use: Yes    Types: Marijuana    Comment: last 01/28/18    Sexual activity: Not Currently  Other Topics Concern   Not on file  Social History Narrative   Lives with dad and stepmother in a one story home.  Has 2 children.     Applying for disability.  Education: BS   Social Determinants of Health   Financial Resource Strain: Low Risk  (06/21/2021)   Overall Financial Resource Strain (CARDIA)    Difficulty of Paying Living Expenses: Not hard at all  Food Insecurity: No Food Insecurity (06/27/2022)   Hunger Vital Sign    Worried About Running Out of Food in the Last Year: Never true    Ran Out of Food in the Last Year: Never true  Transportation Needs: No Transportation Needs (06/27/2022)   PRAPARE - Hydrologist (Medical): No    Lack of Transportation (Non-Medical): No  Physical Activity: Insufficiently Active (06/21/2021)   Exercise Vital Sign    Days of Exercise per Week: 7 days    Minutes of Exercise per Session: 20 min  Stress: No Stress Concern Present (06/21/2021)   [employer]    Feeling of Stress : Not at all  Social Connections: Socially Isolated (06/21/2021)   Social Connection and Isolation Panel [NHANES]    Frequency of Communication with Friends and Family: Once a week    Frequency of Social Gatherings with Friends and Family: Once a week    Attends Religious Services: Never    Marine scientist or  Organizations: No    Attends Music therapist: Never    Marital Status: Divorced    Tobacco Counseling Ready to quit: Not Answered Counseling given: Not Answered Tobacco comments: Occasionally smokes cigarettes   Clinical Intake:  Pre-visit preparation completed: Yes  Pain : 0-10 (recent tooth extraction) Pain Location: Mouth Pain Descriptors / Indicators: Aching Pain Onset: In the past 7 days Pain Frequency: Intermittent  Diabetes: No  How often do you need to have someone help you when you read instructions, pamphlets, or other written materials from your doctor or pharmacy?: 1 - Never   Activities of  Daily Living    06/27/2022    9:17 AM  In your present state of health, do you have any difficulty performing the following activities:  Hearing? 0  Vision? 0  Difficulty concentrating or making decisions? 0  Walking or climbing stairs? 0  Dressing or bathing? 0  Doing errands, shopping? 0  Preparing Food and eating ? N  Using the Toilet? N  In the past six months, have you accidently leaked urine? N  Do you have problems with loss of bowel control? N  Managing your Medications? N  Managing your Finances? N  Housekeeping or managing your Housekeeping? N    Patient Care Team: Saguier, Iris Pert as PCP - General (Internal Medicine)  Indicate any recent Medical Services you may have received from other than [facility] providers in the past year (date may be approximate).     Assessment:   This is a routine wellness examination for [place].  Hearing/Vision screen No results found.  Dietary issues and exercise activities discussed: Current Exercise Habits: Home exercise routine, Type of exercise: walking, Time (Minutes): 30, Frequency (Times/Week): 7, Weekly Exercise (Minutes/Week): 210, Intensity: Mild, Exercise limited by: neurologic condition(s);orthopedic condition(s)   Goals Addressed   None    Depression Screen    06/27/2022    9:11 AM  05/10/2022    9:17 AM 06/21/2021   11:53 AM 05/04/2021   10:52 AM 09/27/2020    9:31 AM 04/30/2020   11:49 AM 01/28/2020   11:10 AM  PHQ 2/9 Scores  PHQ - 2 Score '3 6 1 '$ 0 '5 6 2  '$ PHQ- 9 Score 10    17      Fall Risk    06/27/2022    9:05 AM 05/10/2022    9:17 AM 06/21/2021   11:50 AM 05/04/2021   10:52 AM 04/30/2020   11:49 AM  Fall Risk   Falls in the past year? 0 0 1 0 0  Number falls in past yr: 0  1 1   Injury with Fall? 0  0 0   Risk for fall due to : No Fall Risks;Impaired balance/gait  History of fall(s)    Follow up Falls evaluation completed  Falls prevention discussed      FALL RISK PREVENTION PERTAINING TO THE HOME:  Any stairs in or around the home? Yes  If so, are there any without handrails? No  Home free of loose throw rugs in walkways, pet beds, electrical cords, etc? Yes  Adequate lighting in your home to reduce risk of falls? Yes   ASSISTIVE DEVICES UTILIZED TO PREVENT FALLS:  Life alert? No  Use of a cane, walker or w/c? No  Grab bars in the bathroom? No  Shower chair or bench in shower? No  Elevated toilet seat or a handicapped toilet? No   TIMED UP AND GO:  Was the test performed?  No, audio visit .    Cognitive Function:        06/27/2022    9:24 AM  6CIT Screen  What Year? 4 points  What month? 0 points  What time? 0 points  Count back from 20 2 points  Months in reverse 0 points  Repeat phrase 0 points  Total Score 6 points    Immunizations Immunization History  Administered Date(s) Administered   COVID-19, mRNA, vaccine(Comirnaty)12 years and older 05/18/2022   Hepatitis A, Adult 10/25/1995, 06/26/1996   Hepatitis B, adult 11/24/2008   IPV 05/28/1980   Influenza,inj,Quad PF,6+ Mos 05/02/2019  Influenza-Unspecified 04/19/2021, 03/28/2022   MMR 04/05/1995   PFIZER(Purple Top)SARS-COV-2 Vaccination 09/09/2019, 09/30/2019, 05/12/2020   Pfizer Covid-19 Vaccine Bivalent Booster 13yr & up 04/19/2021   Smallpox 08/03/2003   Td  08/03/2003   Tdap 11/24/2008   Typhoid Inactivated 08/03/2003   Zoster, Live 03/28/2022    TDAP status: Due, Education has been provided regarding the importance of this vaccine. Advised may receive this vaccine at local pharmacy or Health Dept. Aware to provide a copy of the vaccination record if obtained from local pharmacy or Health Dept. Verbalized acceptance and understanding.  Flu Vaccine status: Up to date  Pneumococcal vaccine status: Due, Education has been provided regarding the importance of this vaccine. Advised may receive this vaccine at local pharmacy or Health Dept. Aware to provide a copy of the vaccination record if obtained from local pharmacy or Health Dept. Verbalized acceptance and understanding.  Covid-19 vaccine status: Information provided on how to obtain vaccines.   Qualifies for Shingles Vaccine? Yes   Zostavax completed Yes   Shingrix Completed?: No.    Education has been provided regarding the importance of this vaccine. Patient has been advised to call insurance company to determine out of pocket expense if they have not yet received this vaccine. Advised may also receive vaccine at local pharmacy or Health Dept. Verbalized acceptance and understanding.  Screening Tests Health Maintenance  Topic Date Due   HIV Screening  Never done   Hepatitis C Screening  Never done   Zoster Vaccines- Shingrix (1 of 2) Never done   DTaP/Tdap/Td (3 - Td or Tdap) 11/25/2018   COLONOSCOPY (Pts [id number] coverage will need to be confirmed)  04/17/2021   Medicare Annual Wellness (AWV)  06/21/2022   INFLUENZA VACCINE  Completed   COVID-19 Vaccine  Completed   HPV VACCINES  Aged Out    Health Maintenance  Health Maintenance Due  Topic Date Due   HIV Screening  Never done   Hepatitis C Screening  Never done   Zoster Vaccines- Shingrix (1 of 2) Never done   DTaP/Tdap/Td (3 - Td or Tdap) 11/25/2018   COLONOSCOPY (Pts [id number] coverage will need to be  confirmed)  04/17/2021   Medicare Annual Wellness (AWV)  06/21/2022    Colorectal cancer screening: No longer required. Pt declines at the moment.  Lung Cancer Screening: (Low Dose CT Chest recommended if Age 7-22-80ars, 30 pack-year currently smoking OR have quit w/in 15years.) does not qualify.   Additional Screening:  Hepatitis C Screening: does qualify; Completed N/a  Vision Screening: Recommended annual ophthalmology exams for early detection of glaucoma and other disorders of the eye. Is the patient up to date with their annual eye exam?  Yes  Who is the provider or what is the name of the office in which the patient attends annual eye exams? [facility] If pt is not established with a provider, would they like to be referred to a provider to establish care? No .   Dental Screening: Recommended annual dental exams for proper oral hygiene  Community Resource Referral / Chronic Care Management: CRR required this visit?  No   CCM required this visit?  No      Plan:     I have personally reviewed and noted the following in the patient's chart:   Medical and social history Use of alcohol, tobacco or illicit drugs  Current medications and supplements including opioid prescriptions. Patient is not currently taking opioid prescriptions. Functional ability and status Nutritional status  Physical activity Advanced directives List of other physicians Hospitalizations, surgeries, and ER visits in previous 12 months Vitals Screenings to include cognitive, depression, and falls Referrals and appointments  In addition, I have reviewed and discussed with patient certain preventive protocols, quality metrics, and best practice recommendations. A written personalized care plan for preventive services as well as general preventive health recommendations were provided to patient.   Due to this being a telephonic visit, the after visit summary with patients personalized plan was  offered to patient via mail or my-chart. Patient would like to access on my-chart.  Beatris Ship, [place]   06/27/2022   Nurse Notes: None  Review and Agree with assessment & plan of cma  Mackie Pai, PA-C

## 2022-08-18 ENCOUNTER — Encounter
Payer: Medicare Other | Attending: Physical Medicine and Rehabilitation | Admitting: Physical Medicine and Rehabilitation

## 2022-08-18 ENCOUNTER — Encounter: Payer: Self-pay | Admitting: Physical Medicine and Rehabilitation

## 2022-08-18 VITALS — BP 114/77 | HR 96 | Ht 72.0 in | Wt 208.0 lb

## 2022-08-18 DIAGNOSIS — G992 Myelopathy in diseases classified elsewhere: Secondary | ICD-10-CM

## 2022-08-18 DIAGNOSIS — Z7409 Other reduced mobility: Secondary | ICD-10-CM | POA: Insufficient documentation

## 2022-08-18 DIAGNOSIS — R261 Paralytic gait: Secondary | ICD-10-CM

## 2022-08-18 DIAGNOSIS — R252 Cramp and spasm: Secondary | ICD-10-CM | POA: Diagnosis not present

## 2022-08-18 DIAGNOSIS — M4802 Spinal stenosis, cervical region: Secondary | ICD-10-CM | POA: Diagnosis not present

## 2022-08-18 MED ORDER — BACLOFEN 10 MG PO TABS: 10.0000 mg | ORAL_TABLET | Freq: Four times a day (QID) | ORAL | 3 refills | Status: AC

## 2022-08-18 MED ORDER — DANTROLENE SODIUM 50 MG PO CAPS: 50.0000 mg | ORAL_CAPSULE | Freq: Two times a day (BID) | ORAL | 3 refills | Status: DC

## 2022-08-18 MED ORDER — DIAZEPAM 5 MG PO TABS: 5.0000 mg | ORAL_TABLET | Freq: Three times a day (TID) | ORAL | 5 refills | Status: DC | PRN

## 2022-08-18 NOTE — Progress Notes (Signed)
Subjective:    Patient ID: Bryan Burrow., male    DOB: October 09, 1960, 62 y.o.   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  Here for spasticity f/u as well as incoordination and parasthesia  and f/u after L3/L4 fusion. In summer 2022    Dad has died last week from cancer- was 43 yrs old. Didn't go back to [place], since was taking care of dad.   Thought hurt back several times, but made it through.  Labs looked good- LFTs since on Dantrolene.   Doing Baclofen 10 mg 3x/day- and Dantrolene 50 mg BID Going OK in terms of strength and doesn't make him more tired than before.  Still doing Valium 5 mg 3x/day.   Going back to [place]- leaving tomorrow-  so will see if can walk further with Dantrolene.   TENS unit for feet, rocking- is helpful.  Still has cramping nightly- cannot make 1 night without some cramps.    Has found a PCP in [place]- has appointment in June 2024.  Then can be sent to a PM&R doctor down there.     Pain Inventory Average Pain 3 Pain Right Now3 My pain is dull, stabbing, tingling, and aching  In the last 24 hours, has pain interfered with the following? General activity 5 Relation with others 5 Enjoyment of life 5 What TIME of day is your pain at its worst? morning  Sleep (in general) Poor  Pain is worse with: walking, bending, inactivity, and standing Pain improves with: therapy/exercise and TENS Relief from Meds: 6  Family History  Problem Relation Age of Onset   Diabetes Father    Colon polyps Father    Heart attack Mother    Alcoholism Mother    Hypertension Sister    Colon cancer Neg Hx    Esophageal cancer Neg Hx    Rectal cancer Neg Hx    Stomach cancer Neg Hx    Social History   Socioeconomic History   Marital status: Single    Spouse name: Not on file   Number of children: 2   Years of education: 16    Highest education level: Bachelor's degree (e.g., BA, AB, BS)  Occupational History   Occupation: applying for disability  Tobacco Use   Smoking status: Every Day    Types: E-cigarettes   Smokeless tobacco: Never   Tobacco comments:    Occasionally smokes cigarettes  Vaping Use   Vaping Use: Every day  Substance and Sexual Activity   Alcohol use: Yes    Comment: Socially   Drug use: Yes    Types: Marijuana    Comment: last 01/28/18    Sexual activity: Not Currently  Other Topics Concern   Not on file  Social History Narrative   Lives with dad and stepmother in a one story home.  Has 2 children.     Applying for disability.  Education: BS   Social Determinants of Health   Financial Resource Strain: Low Risk  (06/21/2021)   Overall Financial Resource Strain (CARDIA)    Difficulty of Paying Living Expenses: Not hard at all  Food Insecurity: No Food Insecurity (06/27/2022)   Hunger Vital Sign    Worried About Running Out of Food in the Last Year: Never true    Ran Out of Food in the Last Year: Never true  Transportation Needs:  No Transportation Needs (06/27/2022)   PRAPARE - Hydrologist (Medical): No    Lack of Transportation (Non-Medical): No  Physical Activity: Insufficiently Active (06/21/2021)   Exercise Vital Sign    Days of Exercise per Week: 7 days    Minutes of Exercise per Session: 20 min  Stress: No Stress Concern Present (06/21/2021)   [place]    Feeling of Stress : Not at all  Social Connections: Socially Isolated (06/21/2021)   Social Connection and Isolation Panel [NHANES]    Frequency of Communication with Friends and Family: Once a week    Frequency of Social Gatherings with Friends and Family: Once a week    Attends Religious Services: Never    Marine scientist or Organizations: No    Attends Music therapist: Never    Marital Status: Divorced    Past Surgical History:  Procedure Laterality Date   ANTERIOR CERVICAL DECOMPRESSION/DISCECTOMY FUSION 4 LEVELS N/A 01/31/2018   Procedure: ANTERIOR CERVICAL DECOMPRESSION/DISCECTOMY FUSION, INTERBODY PROSTHESIS, PLATE/SCREWS CERVICAL THREE- CERVICAL FOUR, CERVICAL FOUR - CERVICAL FIVE, CERVICAL FIVE - CERVICAL SIX, CERVICAL SIX- CERVICAL SEVEN;  Surgeon: Newman Pies, MD;  Location: [facility];  Service: Neurosurgery;  Laterality: N/A;  ANTERIOR CERVICAL DECOMPRESSION/DISCECTOMY FUSION, INTERBODY PROSTHESIS, PLATE/SCREWS CER   HAND SURGERY Right    "BB removal"   SHOULDER SURGERY Left 2017   acl repair   Wisdom teeth removal     Past Surgical History:  Procedure Laterality Date   ANTERIOR CERVICAL DECOMPRESSION/DISCECTOMY FUSION 4 LEVELS N/A 01/31/2018   Procedure: ANTERIOR CERVICAL DECOMPRESSION/DISCECTOMY FUSION, INTERBODY PROSTHESIS, PLATE/SCREWS CERVICAL THREE- CERVICAL FOUR, CERVICAL FOUR - CERVICAL FIVE, CERVICAL FIVE - CERVICAL SIX, CERVICAL SIX- CERVICAL SEVEN;  Surgeon: Newman Pies, MD;  Location: [facility];  Service: Neurosurgery;  Laterality: N/A;  ANTERIOR CERVICAL DECOMPRESSION/DISCECTOMY FUSION, INTERBODY PROSTHESIS, PLATE/SCREWS CER   HAND SURGERY Right    "BB removal"   SHOULDER SURGERY Left 2017   acl repair   Wisdom teeth removal     Past Medical History:  Diagnosis Date   Bowel incontinence    due to cervical disc issue   Depression    with pain   Dyspnea    with  exertion and pain   Hyperlipidemia    Hypertension    2 years ago was on med for bp. hctz.   Muscle cramping 01/14/2018   Muscle spasticity    Neuromuscular disorder ([place])    Spinal cord injury   Paresthesia 01/14/2018   PTSD (post-traumatic stress disorder)    Spastic gait 01/14/2018   Spasticity 08/04/2019   Spondylolisthesis of lumbar region 02/06/2019   Stenosis of cervical spine with myelopathy ([place]) 01/31/2018   Urine incontinence 01/2018   due to cervical issue   Ht 6' (1.829 m)   Wt 208 lb  (94.3 kg)   BMI 28.21 kg/m   Opioid Risk Score:   Fall Risk Score:  `1  Depression screen [facility] 2/9     08/18/2022    9:53 AM 06/27/2022    9:11 AM 05/10/2022    9:17 AM 06/21/2021   11:53 AM 05/04/2021   10:52 AM 09/27/2020    9:31 AM 04/30/2020   11:49 AM  Depression screen PHQ 2/9  Decreased Interest 0 2 3 0 0 3 3  Down, Depressed, Hopeless 0 '1 3 1 '$ 0 2 3  PHQ - 2 Score 0 '3 6 1 '$ 0 5 6  Altered  sleeping  1    1   Tired, decreased energy  3    3   Change in appetite  0    2   Feeling bad or failure about yourself   3    3   Trouble concentrating  0    1   Moving slowly or fidgety/restless  0    1   Suicidal thoughts  0    1   PHQ-9 Score  10    17   Difficult doing work/chores  Somewhat difficult    Somewhat difficult       Review of Systems  Musculoskeletal:  Positive for back pain and gait problem.  All other systems reviewed and are negative.     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  Here for spasticity f/u as well as incoordination and parasthesia  and f/u after L3/L4 fusion. In summer 2022    1. Needs Valium due to spasticity, NOT anxiety- patient has been on a stable dose for 2 years- expect him to stay on the same dose for the foreseeable future. Unless his spasticity gets worse, don't see reason to change dose for any particular reason- would likely need to increase baclofen or Dantrolene prior to Valium.   2. Con't Valium 5 mg 3x/day- #90- 5 refills  3. Con't Baclofen 10 mg 3x/day- 3 months supply and 3 refills.    4. Con't Dantrolene 50 mg  2x/day- 3 months supply with 3 refills.    5. Need to have CMP/liver labs checked every 6 months- while on Dantrolene.    6. F/U as needed- f/u on spasticity from cervical myelopathy   I spent a total of  23  minutes on total care today- >50% coordination of care- due to  discussion of moving to [facility] and how to make that occur- also documentation for moving to [facility]

## 2022-08-18 NOTE — Patient Instructions (Signed)
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  Here for spasticity f/u as well as incoordination and parasthesia  and f/u after L3/L4 fusion. In summer 2022    1. Needs Valium due to spasticity, NOT anxiety- patient has been on a stable dose for 2 years- expect him to stay on the same dose for the foreseeable future. Unless his spasticity gets worse, don't see reason to change dose for any particular reason- would likely need to increase baclofen or Dantrolene prior to Valium.   2. Con't Valium 5 mg 3x/day- #90- 5 refills  3. Con't Baclofen 10 mg 3x/day- 3 months supply and 3 refills.    4. Con't Dantrolene 50 mg 2x/day- 3 months supply with 3 refills.    5. Need to have CMP/liver labs checked every 6 months- while on Dantrolene.    6. F/U as needed- f/u on spasticity from cervical myelopathy

## 2022-08-31 ENCOUNTER — Telehealth: Payer: Self-pay | Admitting: Medical

## 2022-08-31 ENCOUNTER — Other Ambulatory Visit: Payer: Self-pay | Admitting: Medical

## 2022-08-31 MED ORDER — CHLORTHALIDONE 25 MG PO TABS: 25.0000 mg | ORAL_TABLET | Freq: Every day | ORAL | 3 refills | Status: AC

## 2022-08-31 NOTE — Telephone Encounter (Signed)
Rx sent 

## 2022-08-31 NOTE — Telephone Encounter (Signed)
Medication: chlorthalidone (HYGROTON) 25 MG tablet  losartan (COZAAR) 25 MG tablet   Has the patient contacted their pharmacy? Yes.   (If no, request that the patient contact the pharmacy for the refill.) (If yes, when and what did the pharmacy advise?)  Preferred Pharmacy:  [facility], [place] [address] [place] [place] [address] Phone: [phone]  Fax: [phone]

## 2022-11-18 ENCOUNTER — Other Ambulatory Visit: Payer: Self-pay | Admitting: Medical

## 2023-01-19 ENCOUNTER — Other Ambulatory Visit: Payer: Self-pay | Admitting: Physical Medicine and Rehabilitation

## 2023-06-15 ENCOUNTER — Other Ambulatory Visit: Payer: Self-pay | Admitting: Physical Medicine and Rehabilitation
